# Patient Record
Sex: Female | Born: 1985
Health system: Southern US, Community
[De-identification: ages and names within clinical notes are randomized; demographics above are authoritative.]

## PROBLEM LIST (undated history)

## (undated) ENCOUNTER — Inpatient Hospital Stay (HOSPITAL_COMMUNITY): Payer: Self-pay

## (undated) DIAGNOSIS — O24419 Gestational diabetes mellitus in pregnancy, unspecified control: Secondary | ICD-10-CM

## (undated) DIAGNOSIS — Z789 Other specified health status: Secondary | ICD-10-CM

## (undated) HISTORY — PX: NECK SURGERY: SHX720

---

## 2016-10-15 ENCOUNTER — Encounter: Payer: Self-pay | Admitting: Family Medicine

## 2016-10-15 ENCOUNTER — Inpatient Hospital Stay (HOSPITAL_COMMUNITY): Payer: Medicaid Other

## 2016-10-15 ENCOUNTER — Encounter (HOSPITAL_COMMUNITY): Payer: Self-pay | Admitting: Obstetrics and Gynecology

## 2016-10-15 ENCOUNTER — Ambulatory Visit (INDEPENDENT_AMBULATORY_CARE_PROVIDER_SITE_OTHER): Payer: Self-pay | Admitting: Family Medicine

## 2016-10-15 ENCOUNTER — Inpatient Hospital Stay (HOSPITAL_COMMUNITY)
Admission: AD | Admit: 2016-10-15 | Discharge: 2016-10-15 | Disposition: A | Discharge status: Home / Self Care | Payer: Medicaid Other | Source: Ambulatory Visit | Attending: Obstetrics & Gynecology | Admitting: Obstetrics & Gynecology

## 2016-10-15 VITALS — BP 114/78 | HR 82 | Temp 98.8°F | Resp 16 | Ht 65.0 in | Wt 202.5 lb

## 2016-10-15 DIAGNOSIS — B373 Candidiasis of vulva and vagina: Secondary | ICD-10-CM

## 2016-10-15 DIAGNOSIS — Z3A01 Less than 8 weeks gestation of pregnancy: Secondary | ICD-10-CM | POA: Insufficient documentation

## 2016-10-15 DIAGNOSIS — O209 Hemorrhage in early pregnancy, unspecified: Secondary | ICD-10-CM | POA: Diagnosis not present

## 2016-10-15 DIAGNOSIS — N912 Amenorrhea, unspecified: Secondary | ICD-10-CM

## 2016-10-15 DIAGNOSIS — R102 Pelvic and perineal pain: Secondary | ICD-10-CM

## 2016-10-15 DIAGNOSIS — Z79899 Other long term (current) drug therapy: Secondary | ICD-10-CM | POA: Diagnosis not present

## 2016-10-15 DIAGNOSIS — R109 Unspecified abdominal pain: Secondary | ICD-10-CM | POA: Diagnosis not present

## 2016-10-15 DIAGNOSIS — O3680X0 Pregnancy with inconclusive fetal viability, not applicable or unspecified: Secondary | ICD-10-CM

## 2016-10-15 DIAGNOSIS — O26891 Other specified pregnancy related conditions, first trimester: Secondary | ICD-10-CM | POA: Insufficient documentation

## 2016-10-15 DIAGNOSIS — O26899 Other specified pregnancy related conditions, unspecified trimester: Secondary | ICD-10-CM

## 2016-10-15 DIAGNOSIS — O4690 Antepartum hemorrhage, unspecified, unspecified trimester: Secondary | ICD-10-CM

## 2016-10-15 DIAGNOSIS — B3731 Acute candidiasis of vulva and vagina: Secondary | ICD-10-CM

## 2016-10-15 DIAGNOSIS — O208 Other hemorrhage in early pregnancy: Secondary | ICD-10-CM

## 2016-10-15 DIAGNOSIS — N949 Unspecified condition associated with female genital organs and menstrual cycle: Secondary | ICD-10-CM

## 2016-10-15 HISTORY — DX: Other specified health status: Z78.9

## 2016-10-15 LAB — POC MICROSCOPIC URINALYSIS (UMFC): Mucus: ABSENT

## 2016-10-15 LAB — POCT WET + KOH PREP: Trich by wet prep: ABSENT

## 2016-10-15 LAB — POCT CBC
GRANULOCYTE PERCENT: 58.4 % (ref 37–80)
HEMATOCRIT: 38.9 % (ref 37.7–47.9)
Hemoglobin: 13.2 g/dL (ref 12.2–16.2)
LYMPH, POC: 1.9 (ref 0.6–3.4)
MCH, POC: 28.9 pg (ref 27–31.2)
MCHC: 34 g/dL (ref 31.8–35.4)
MCV: 85 fL (ref 80–97)
MID (CBC): 1 — AB (ref 0–0.9)
MPV: 7.9 fL (ref 0–99.8)
POC GRANULOCYTE: 4.1 (ref 2–6.9)
POC LYMPH %: 27.4 % (ref 10–50)
POC MID %: 14.2 %M — AB (ref 0–12)
Platelet Count, POC: 277 10*3/uL (ref 142–424)
RBC: 4.58 M/uL (ref 4.04–5.48)
RDW, POC: 12.9 %
WBC: 7 10*3/uL (ref 4.6–10.2)

## 2016-10-15 LAB — POCT URINALYSIS DIP (MANUAL ENTRY)
Bilirubin, UA: NEGATIVE
GLUCOSE UA: NEGATIVE mg/dL
Ketones, POC UA: NEGATIVE mg/dL
Nitrite, UA: NEGATIVE
Spec Grav, UA: 1.02 (ref 1.010–1.025)
UROBILINOGEN UA: 0.2 U/dL
pH, UA: 6.5 (ref 5.0–8.0)

## 2016-10-15 LAB — ABO/RH: ABO/RH(D): O POS

## 2016-10-15 LAB — BETA HCG QUANT (REF LAB): hCG Quant: 31 m[IU]/mL

## 2016-10-15 LAB — HCG, QUANTITATIVE, PREGNANCY: HCG, BETA CHAIN, QUANT, S: 31 m[IU]/mL — AB (ref <5)

## 2016-10-15 LAB — POCT URINE PREGNANCY: PREG TEST UR: POSITIVE — AB

## 2016-10-15 NOTE — Patient Instructions (Addendum)
Estimated due date: 06/16/2017 so you are [redacted] weeks pregnant.  I would like you to go to the Maternity Admissions Unit at [facility] - they are expecting you for a pelvic ultrasound to ensure that you do not have an ectopic (tubal) pregnancy.   IF you received an x-ray today, you will receive an invoice from [facility]. Please contact [facility] at [phone] with questions or concerns regarding your invoice.   IF you received labwork today, you will receive an invoice from [facility]. Please contact [facility] at [phone] with questions or concerns regarding your invoice.   Our billing staff will not be able to assist you with questions regarding bills from these companies.  You will be contacted with the lab results as soon as they are available. The fastest way to get your results is to activate your My Chart account. Instructions are located on the last page of this paperwork. If you have not heard from [place] regarding the results in 2 weeks, please contact this office.     Vaginal Bleeding During Pregnancy, First Trimester A small amount of bleeding (spotting) from the vagina is relatively common in early pregnancy. It usually stops on its own. Various things may cause bleeding or spotting in early pregnancy. Some bleeding may be related to the pregnancy, and some may not. In most cases, the bleeding is normal and is not a problem. However, bleeding can also be a sign of something serious. Be sure to tell your health care provider about any vaginal bleeding right away. Some possible causes of vaginal bleeding during the first trimester include:  Infection or inflammation of the cervix.  Growths (polyps) on the cervix.  Miscarriage or threatened miscarriage.  Pregnancy tissue has developed outside of the uterus and in a fallopian tube (tubal pregnancy).  Tiny cysts have developed in the uterus instead of pregnancy tissue (molar pregnancy).  Follow these  instructions at home: Watch your condition for any changes. The following actions may help to lessen any discomfort you are feeling:  Follow your health care provider's instructions for limiting your activity. If your health care provider orders bed rest, you may need to stay in bed and only get up to use the bathroom. However, your health care provider may allow you to continue light activity.  If needed, make plans for someone to help with your regular activities and responsibilities while you are on bed rest.  Keep track of the number of pads you use each day, how often you change pads, and how soaked (saturated) they are. Write this down.  Do not use tampons. Do not douche.  Do not have sexual intercourse or orgasms until approved by your health care provider.  If you pass any tissue from your vagina, save the tissue so you can show it to your health care provider.  Only take over-the-counter or prescription medicines as directed by your health care provider.  Do not take aspirin because it can make you bleed.  Keep all follow-up appointments as directed by your health care provider.  Contact a health care provider if:  You have any vaginal bleeding during any part of your pregnancy.  You have cramps or labor pains.  You have a fever, not controlled by medicine. Get help right away if:  You have severe cramps in your back or belly (abdomen).  You pass large clots or tissue from your vagina.  Your bleeding increases.  You feel light-headed or weak, or you have fainting episodes.  You have chills.  You are leaking fluid or have a gush of fluid from your vagina.  You pass out while having a bowel movement. This information is not intended to replace advice given to you by your health care provider. Make sure you discuss any questions you have with your health care provider. Document Released: 11/10/2004 Document Revised: 07/09/2015 Document Reviewed: 10/08/2012 Elsevier  Interactive Patient Education  2018 [employer].    Ectopic Pregnancy An ectopic pregnancy is when the fertilized egg attaches (implants) outside the uterus. Most ectopic pregnancies occur in one of the tubes where eggs travel from the ovary to the uterus (fallopian tubes), but the implanting can occur in other locations. In rare cases, ectopic pregnancies occur on the ovary, intestine, pelvis, abdomen, or cervix. In an ectopic pregnancy, the fertilized egg does not have the ability to develop into a normal, healthy baby. A ruptured ectopic pregnancy is one in which tearing or bursting of a fallopian tube causes internal bleeding. Often, there is intense lower abdominal pain, and vaginal bleeding sometimes occurs. Having an ectopic pregnancy can be life-threatening. If this dangerous condition is not treated, it can lead to blood loss, shock, or even death. What are the causes? The most common cause of this condition is damage to one of the fallopian tubes. A fallopian tube may be narrowed or blocked, and that keeps the fertilized egg from reaching the uterus. What increases the risk? This condition is more likely to develop in women of childbearing age who have different levels of risk. The levels of risk can be divided into three categories. High risk  You have gone through infertility treatment.  You have had an ectopic pregnancy before.  You have had surgery on the fallopian tubes, or another surgical procedure, such as an abortion.  You have had surgery to have the fallopian tubes tied (tubal ligation).  You have problems or diseases of the fallopian tubes.  You have been exposed to diethylstilbestrol (DES). This medicine was used until 1971, and it had effects on babies whose mothers took the medicine.  You become pregnant while using an IUD (intrauterine device) for birth control. Moderate risk  You have a history of infertility.  You have had an STI (sexually transmitted  infection).  You have a history of pelvic inflammatory disease (PID).  You have scarring from endometriosis.  You have multiple sexual partners.  You smoke. Low risk  You have had pelvic surgery.  You use vaginal douches.  You became sexually active before age 56. What are the signs or symptoms? Common symptoms of this condition include normal pregnancy symptoms, such as missing a period, nausea, tiredness, abdominal pain, breast tenderness, and bleeding. However, ectopic pregnancy will have additional symptoms, such as:  Pain with intercourse.  Irregular vaginal bleeding or spotting.  Cramping or pain on one side or in the lower abdomen.  Fast heartbeat, low blood pressure, and sweating.  Passing out while having a bowel movement.  Symptoms of a ruptured ectopic pregnancy and internal bleeding may include:  Sudden, severe pain in the abdomen and pelvis.  Dizziness, weakness, light-headedness, or fainting.  Pain in the shoulder or neck area.  How is this diagnosed? This condition is diagnosed by:  A pelvic exam to locate pain or a mass in the abdomen.  A pregnancy test. This blood test checks for the presence as well as the specific level of pregnancy hormone in the bloodstream.  Ultrasound. This is performed if a pregnancy test  is positive. In this test, a probe is inserted into the vagina. The probe will detect a fetus, possibly in a location other than the uterus.  Taking a sample of uterus tissue (dilation and curettage, or D&C).  Surgery to perform a visual exam of the inside of the abdomen using a thin, lighted tube that has a tiny camera on the end (laparoscope).  Culdocentesis. This procedure involves inserting a needle at the top of the vagina, behind the uterus. If blood is present in this area, it may indicate that a fallopian tube is torn.  How is this treated? This condition is treated with medicine or surgery. Medicine  An injection of a medicine  (methotrexate) may be given to cause the pregnancy tissue to be absorbed. This medicine may save your fallopian tube. It may be given if: ? The diagnosis is made early, with no signs of active bleeding. ? The fallopian tube has not ruptured. ? You are considered to be a good candidate for the medicine. Usually, pregnancy hormone blood levels are checked after methotrexate treatment. This is to be sure that the medicine is effective. It may take 4-6 weeks for the pregnancy to be absorbed. Most pregnancies will be absorbed by 3 weeks. Surgery  A laparoscope may be used to remove the pregnancy tissue.  If severe internal bleeding occurs, a larger cut (incision) may be made in the lower abdomen (laparotomy) to remove the fetus and placenta. This is done to stop the bleeding.  Part or all of the fallopian tube may be removed (salpingectomy) along with the fetus and placenta. The fallopian tube may also be repaired during the surgery.  In very rare circumstances, removal of the uterus (hysterectomy) may be required.  After surgery, pregnancy hormone testing may be done to be sure that there is no pregnancy tissue left. Whether your treatment is medicine or surgery, you may receive a Rho (D) immune globulin shot to prevent problems with any future pregnancy. This shot may be given if:  You are Rh-negative and the baby's father is Rh-positive.  You are Rh-negative and you do not know the Rh type of the baby's father.  Follow these instructions at home:  Rest and limit your activity after the procedure for as long as told by your health care provider.  Until your health care provider says that it is safe: ? Do not lift anything that is heavier than 10 lb (4.5 kg), or the limit that your health care provider tells you. ? Avoid physical exercise and any movement that requires effort (is strenuous).  To help prevent constipation: ? Eat a healthy diet that includes fruits, vegetables, and whole  grains. ? Drink 6-8 glasses of water per day. Get help right away if:  You develop worsening pain that is not relieved by medicine.  You have: ? A fever or chills. ? Vaginal bleeding. ? Redness and swelling at the incision site. ? Nausea and vomiting.  You feel dizzy or weak.  You feel light-headed or you faint. This information is not intended to replace advice given to you by your health care provider. Make sure you discuss any questions you have with your health care provider. Document Released: 03/10/2004 Document Revised: 09/30/2015 Document Reviewed: 09/02/2015 Elsevier Interactive Patient Education  2018 [employer].  First Trimester of Pregnancy The first trimester of pregnancy is from week 1 until the end of week 13 (months 1 through 3). A week after a sperm fertilizes an egg, the egg  will implant on the wall of the uterus. This embryo will begin to develop into a baby. Genes from you and your partner will form the baby. The female genes will determine whether the baby will be a boy or a girl. At 6-8 weeks, the eyes and face will be formed, and the heartbeat can be seen on ultrasound. At the end of 12 weeks, all the baby's organs will be formed. Now that you are pregnant, you will want to do everything you can to have a healthy baby. Two of the most important things are to get good prenatal care and to follow your health care provider's instructions. Prenatal care is all the medical care you receive before the baby's birth. This care will help prevent, find, and treat any problems during the pregnancy and childbirth. Body changes during your first trimester Your body goes through many changes during pregnancy. The changes vary from woman to woman.  You may gain or lose a couple of pounds at first.  You may feel sick to your stomach (nauseous) and you may throw up (vomit). If the vomiting is uncontrollable, call your health care provider.  You may tire easily.  You may develop  headaches that can be relieved by medicines. All medicines should be approved by your health care provider.  You may urinate more often. Painful urination may mean you have a bladder infection.  You may develop heartburn as a result of your pregnancy.  You may develop constipation because certain hormones are causing the muscles that push stool through your intestines to slow down.  You may develop hemorrhoids or swollen veins (varicose veins).  Your breasts may begin to grow larger and become tender. Your nipples may stick out more, and the tissue that surrounds them (areola) may become darker.  Your gums may bleed and may be sensitive to brushing and flossing.  Dark spots or blotches (chloasma, mask of pregnancy) may develop on your face. This will likely fade after the baby is born.  Your menstrual periods will stop.  You may have a loss of appetite.  You may develop cravings for certain kinds of food.  You may have changes in your emotions from day to day, such as being excited to be pregnant or being concerned that something may go wrong with the pregnancy and baby.  You may have more vivid and strange dreams.  You may have changes in your hair. These can include thickening of your hair, rapid growth, and changes in texture. Some women also have hair loss during or after pregnancy, or hair that feels dry or thin. Your hair will most likely return to normal after your baby is born.  What to expect at prenatal visits During a routine prenatal visit:  You will be weighed to make sure you and the baby are growing normally.  Your blood pressure will be taken.  Your abdomen will be measured to track your baby's growth.  The fetal heartbeat will be listened to between weeks 10 and 14 of your pregnancy.  Test results from any previous visits will be discussed.  Your health care provider may ask you:  How you are feeling.  If you are feeling the baby move.  If you have had  any abnormal symptoms, such as leaking fluid, bleeding, severe headaches, or abdominal cramping.  If you are using any tobacco products, including cigarettes, chewing tobacco, and electronic cigarettes.  If you have any questions.  Other tests that may be performed during  your first trimester include:  Blood tests to find your blood type and to check for the presence of any previous infections. The tests will also be used to check for low iron levels (anemia) and protein on red blood cells (Rh antibodies). Depending on your risk factors, or if you previously had diabetes during pregnancy, you may have tests to check for high blood sugar that affects pregnant women (gestational diabetes).  Urine tests to check for infections, diabetes, or protein in the urine.  An ultrasound to confirm the proper growth and development of the baby.  Fetal screens for spinal cord problems (spina bifida) and Down syndrome.  HIV (human immunodeficiency virus) testing. Routine prenatal testing includes screening for HIV, unless you choose not to have this test.  You may need other tests to make sure you and the baby are doing well.  Follow these instructions at home: Medicines  Follow your health care provider's instructions regarding medicine use. Specific medicines may be either safe or unsafe to take during pregnancy.  Take a prenatal vitamin that contains at least 600 micrograms (mcg) of folic acid.  If you develop constipation, try taking a stool softener if your health care provider approves. Eating and drinking  Eat a balanced diet that includes fresh fruits and vegetables, whole grains, good sources of protein such as meat, eggs, or tofu, and low-fat dairy. Your health care provider will help you determine the amount of weight gain that is right for you.  Avoid raw meat and uncooked cheese. These carry germs that can cause birth defects in the baby.  Eating four or five small meals rather than  three large meals a day may help relieve nausea and vomiting. If you start to feel nauseous, eating a few soda crackers can be helpful. Drinking liquids between meals, instead of during meals, also seems to help ease nausea and vomiting.  Limit foods that are high in fat and processed sugars, such as fried and sweet foods.  To prevent constipation: ? Eat foods that are high in fiber, such as fresh fruits and vegetables, whole grains, and beans. ? Drink enough fluid to keep your urine clear or pale yellow. Activity  Exercise only as directed by your health care provider. Most women can continue their usual exercise routine during pregnancy. Try to exercise for 30 minutes at least 5 days a week. Exercising will help you: ? Control your weight. ? Stay in shape. ? Be prepared for labor and delivery.  Experiencing pain or cramping in the lower abdomen or lower back is a good sign that you should stop exercising. Check with your health care provider before continuing with normal exercises.  Try to avoid standing for long periods of time. Move your legs often if you must stand in one place for a long time.  Avoid heavy lifting.  Wear low-heeled shoes and practice good posture.  You may continue to have sex unless your health care provider tells you not to. Relieving pain and discomfort  Wear a good support bra to relieve breast tenderness.  Take warm sitz baths to soothe any pain or discomfort caused by hemorrhoids. Use hemorrhoid cream if your health care provider approves.  Rest with your legs elevated if you have leg cramps or low back pain.  If you develop varicose veins in your legs, wear support hose. Elevate your feet for 15 minutes, 3-4 times a day. Limit salt in your diet. Prenatal care  Schedule your prenatal visits by the twelfth  week of pregnancy. They are usually scheduled monthly at first, then more often in the last 2 months before delivery.  Write down your questions.  Take them to your prenatal visits.  Keep all your prenatal visits as told by your health care provider. This is important. Safety  Wear your seat belt at all times when driving.  Make a list of emergency phone numbers, including numbers for family, friends, the hospital, and police and fire departments. General instructions  Ask your health care provider for a referral to a local prenatal education class. Begin classes no later than the beginning of month 6 of your pregnancy.  Ask for help if you have counseling or nutritional needs during pregnancy. Your health care provider can offer advice or refer you to specialists for help with various needs.  Do not use hot tubs, steam rooms, or saunas.  Do not douche or use tampons or scented sanitary pads.  Do not cross your legs for long periods of time.  Avoid cat litter boxes and soil used by cats. These carry germs that can cause birth defects in the baby and possibly loss of the fetus by miscarriage or stillbirth.  Avoid all smoking, herbs, alcohol, and medicines not prescribed by your health care provider. Chemicals in these products affect the formation and growth of the baby.  Do not use any products that contain nicotine or tobacco, such as cigarettes and e-cigarettes. If you need help quitting, ask your health care provider. You may receive counseling support and other resources to help you quit.  Schedule a dentist appointment. At home, brush your teeth with a soft toothbrush and be gentle when you floss. Contact a health care provider if:  You have dizziness.  You have mild pelvic cramps, pelvic pressure, or nagging pain in the abdominal area.  You have persistent nausea, vomiting, or diarrhea.  You have a bad smelling vaginal discharge.  You have pain when you urinate.  You notice increased swelling in your face, hands, legs, or ankles.  You are exposed to fifth disease or chickenpox.  You are exposed to [place]  (rubella) and have never had it. Get help right away if:  You have a fever.  You are leaking fluid from your vagina.  You have spotting or bleeding from your vagina.  You have severe abdominal cramping or pain.  You have rapid weight gain or loss.  You vomit blood or material that looks like coffee grounds.  You develop a severe headache.  You have shortness of breath.  You have any kind of trauma, such as from a fall or a car accident. Summary  The first trimester of pregnancy is from week 1 until the end of week 13 (months 1 through 3).  Your body goes through many changes during pregnancy. The changes vary from woman to woman.  You will have routine prenatal visits. During those visits, your health care provider will examine you, discuss any test results you may have, and talk with you about how you are feeling. This information is not intended to replace advice given to you by your health care provider. Make sure you discuss any questions you have with your health care provider. Document Released: 01/25/2001 Document Revised: 01/13/2016 Document Reviewed: 01/13/2016 Elsevier Interactive Patient Education  2017 [employer]

## 2016-10-15 NOTE — MAU Provider Note (Signed)
History     CSN: [id number]  Arrival date and time: 10/15/16 1531   First Provider Initiated Contact with Patient 10/15/16 1547      Chief Complaint  Patient presents with  . vaginal bleeding in pregnancy  . Abdominal Pain   Christy Jackson is a 31 y.o. G1P0 at [redacted]w[redacted]d sent here from [facility] where she presented with 5 day history of light vaginal bleeding that became redder today. She has not needed to wear a peri-pad and has not had bleeding as heavy as a period. The bleeding has been associated with intermittent mild suprapubic cramping. Bleeding was not post-coital. Pregnancy is desired.  Labs results from [place]: Pos UPT, , UA with large blood, normalCBC, WP significant for yeast. Pending GC/CT, urine culture       Past Medical History:  Diagnosis Date  . Medical history non-contributory     Past Surgical History:  Procedure Laterality Date  . NO PAST SURGERIES      History reviewed. No pertinent family history.  Social History  Substance Use Topics  . Smoking status: Never Smoker  . Smokeless tobacco: Never Used  . Alcohol use No    Allergies: No Known Allergies  Prescriptions Prior to Admission  Medication Sig Dispense Refill Last Dose  . folic acid (FOLVITE) 400 MCG tablet Take 400 mcg by mouth daily.   10/14/2016 at Unknown time    Review of Systems  Constitutional: Negative for fatigue.  Gastrointestinal: Positive for abdominal pain.  Genitourinary: Positive for vaginal bleeding. Negative for dysuria, frequency, urgency, vaginal discharge and vaginal pain.  Neurological: Negative for light-headedness and headaches.   Physical Exam   Blood pressure 116/77, pulse 77, temperature 98.2 F (36.8 C), temperature source Oral, resp. rate 18, last menstrual period 09/09/2016.  Physical Exam  Nursing note and vitals reviewed. Constitutional: She appears well-developed and well-nourished. No distress.  Eyes: Pupils are equal, round, and reactive to  light.  Neck: Neck supple.  Cardiovascular: Normal rate.   Respiratory: Effort normal.  GI: There is no tenderness. There is no guarding.  Genitourinary:  Genitourinary Comments: VE: small amount dark red blood, cx closed, uterus not appreciably enlarged  Musculoskeletal: Normal range of motion.  Neurological: She is alert.  Skin: Skin is warm and dry.  Psychiatric: She has a normal mood and affect. Her behavior is normal.    MAU Course  Procedures Results for orders placed or performed during the hospital encounter of 10/15/16 (from the past 24 hour(s))  hCG, quantitative, pregnancy     Status: Abnormal   Collection Time: 10/15/16  4:15 PM  Result Value Ref Range   hCG, Beta Chain, Quant, S 31 (H) <5 mIU/mL  ABO/Rh     Status: None (Preliminary result)   Collection Time: 10/15/16  4:58 PM  Result Value Ref Range   ABO/RH(D) O POS    US Ob Comp Less 14 Wks  Result Date: 10/15/2016 CLINICAL DATA:  Bleeding for 5 days. EXAM: OBSTETRIC <14 WK [place] AND TRANSVAGINAL OB US TECHNIQUE: Both transabdominal and transvaginal ultrasound examinations were performed for complete evaluation of the gestation as well as the maternal uterus, adnexal regions, and pelvic cul-de-sac. Transvaginal technique was performed to assess early pregnancy. COMPARISON:  None. FINDINGS: Intrauterine gestational sac: None Yolk sac:  Absent Embryo:  Absent Cardiac Activity: Absent Heart Rate: Absent  bpm MSD:   mm    w     d CRL:    mm    w  d                  US EDC: Subchorionic hemorrhage:  Not applicable Maternal uterus/adnexae:  Normal IMPRESSION: No intrauterine gestation identified. The uterus and adnexa are otherwise normal. Electronically Signed   By: Sherian ReinWei-Chen  Lin M.D.   On: 10/15/2016 17:52   [place] Ob Transvaginal  Result Date: 10/15/2016 CLINICAL DATA:  Bleeding for 5 days. EXAM: OBSTETRIC <14 WK US AND TRANSVAGINAL OB US TECHNIQUE: Both transabdominal and transvaginal ultrasound examinations were performed for  complete evaluation of the gestation as well as the maternal uterus, adnexal regions, and pelvic cul-de-sac. Transvaginal technique was performed to assess early pregnancy. COMPARISON:  None. FINDINGS: Intrauterine gestational sac: None Yolk sac:  Absent Embryo:  Absent Cardiac Activity: Absent Heart Rate: Absent  bpm MSD:   mm    w     d CRL:    mm    w    d                  US EDC: Subchorionic hemorrhage:  Not applicable Maternal uterus/adnexae:  Normal IMPRESSION: No intrauterine gestation identified. The uterus and adnexa are otherwise normal. Electronically Signed   By: Sherian ReinWei-Chen  Lin M.D.   On: 10/15/2016 17:52     Assessment and Plan  G1 at 2767w1d 1. Pregnancy of unknown anatomic location   2. Bleeding in early pregnancy   3. Vaginal bleeding in pregnancy   4. Abdominal pain affecting pregnancy    Allergies as of 10/15/2016   No Known Allergies     Medication List    TAKE these medications   folic acid 400 MCG tablet Commonly known as:  FOLVITE Take 400 mcg by mouth daily.      Follow-up Information    [place], Nursepractioner, NP Follow up in 2 day(s).           Shakema Surita CNM 10/15/2016, 5:32 PM

## 2016-10-15 NOTE — MAU Note (Signed)
Pt sent to MAU from [facility], has been bleeding for the last 5 days, was spotting, is heavier today.  Has occasional abd & back pain.  Pos UPT @ UC today.

## 2016-10-15 NOTE — Progress Notes (Signed)
Subjective:    Patient ID: Christy Jackson, female    DOB: 05/01/1987, 31 y.o.   MRN: [id number] Chief Complaint  Patient presents with  . Amenorrhea    HPI This is her first pregnancy. LMP 09/09/16. She is excited about the pregnancy. However, for the past 5d she has had vaginal bleeding/spotting with suprapubic abd pain that radiates to the right lower quadrant and the right flank.  She has had sweats but no f/c and has been nauseated and constipated. No vomiting. Has not taken anything or tried anything. Her menses are usually every 21d so this does NOT correlate with her first expected cycle since conception. Cycles usually last 4-5 d with good flow and no spotting between. No ob-gyn yet.  Past Medical History:  Diagnosis Date  . Medical history non-contributory    Past Surgical History:  Procedure Laterality Date  . NO PAST SURGERIES     No current outpatient prescriptions on file prior to visit.   No current facility-administered medications on file prior to visit.    No Known Allergies No family history on file. Social History   Social History  . Marital status: Married    Spouse name: N/A  . Number of children: N/A  . Years of education: N/A   Social History Main Topics  . Smoking status: Never Smoker  . Smokeless tobacco: Never Used  . Alcohol use No  . Drug use: No  . Sexual activity: Yes   Other Topics Concern  . None   Social History Narrative  . None   Depression screen [facility] 2/9 10/15/2016  Decreased Interest 0  Down, Depressed, Hopeless 0  PHQ - 2 Score 0    Review of Systems  Constitutional: Positive for diaphoresis and fatigue. Negative for activity change, appetite change, chills, fever and unexpected weight change.  Gastrointestinal: Positive for abdominal pain, constipation and nausea. Negative for abdominal distention, diarrhea and vomiting.  Genitourinary: Positive for decreased urine volume, flank pain, menstrual problem, pelvic pain, vaginal  bleeding and vaginal discharge. Negative for dysuria, enuresis, genital sores, hematuria, urgency and vaginal pain.       Objective:   Physical Exam  Constitutional: She is oriented to person, place, and time. She appears well-developed and well-nourished. No distress.  HENT:  Head: Normocephalic and atraumatic.  Cardiovascular: Normal rate, regular rhythm, normal heart sounds and intact distal pulses.   Pulmonary/Chest: Effort normal and breath sounds normal.  Abdominal: Soft. Normal appearance and bowel sounds are normal. She exhibits no distension and no mass. There is no hepatosplenomegaly. There is tenderness in the suprapubic area. There is no rigidity, no rebound, no guarding and no CVA tenderness.  Genitourinary: Pelvic exam was performed with patient supine. There is no rash, tenderness or lesion on the right labia. There is no rash, tenderness or lesion on the left labia. Uterus is tender. Cervix exhibits motion tenderness. Cervix exhibits no friability. Right adnexum displays tenderness. Right adnexum displays no mass and no fullness. Left adnexum displays no mass, no tenderness and no fullness. There is bleeding in the vagina. No erythema or tenderness in the vagina. Vaginal discharge found.  Genitourinary Comments: Small amount of thin maroon bloody discharge in vaginal vault, cervix appears nml and closed but + CMT with sig ttp of uterus and mod ttp of Rt adnexa  Lymphadenopathy:       Right: No inguinal adenopathy present.       Left: No inguinal adenopathy present.  Neurological: She is alert and  oriented to person, place, and time.  Skin: Skin is warm and dry. She is not diaphoretic.  Psychiatric: She has a normal mood and affect. Her behavior is normal.      BP 114/78   Pulse 82   Temp 98.8 F (37.1 C) (Oral)   Resp 16   Ht 5\' 5"  (1.651 m)   Wt 202 lb 8 oz (91.9 kg)   LMP 09/09/2016   SpO2 95%   BMI 33.70 kg/m   Results for orders placed or performed in visit on  10/15/16  POCT urine pregnancy  Result Value Ref Range   Preg Test, Ur Positive (A) Negative      Assessment & Plan:   1. Amenorrhea - reassured pt that there is a possibility that this is nml spotting early in preg and reassuring that cervix is closed but there is also a poss that she could be miscarrying so will check quant hct in case needs to be followed serially. However, her right adnexal and flank pain are concerning for the poss of an ectopic preg so need stat pelvic/TV US today.  It is a Sat so will send her to [facility] ER - discussed with CNM on call who is expecting pt and pt understands to go there immed.  2. Vaginal bleeding affecting early pregnancy   3. Pelvic pain affecting pregnancy in first trimester, antepartum   4. Cervical motion tenderness   5. Vaginal candidiasis     Orders Placed This Encounter  Procedures  . US Pelvis Complete    Standing Status:   Future    Standing Expiration Date:   12/15/2017    Order Specific Question:   Reason for Exam (SYMPTOM  OR DIAGNOSIS REQUIRED)    Answer:   +UPT, [redacted] wks GA by LMP, 5d of increasing Rt adnexal/flank pain and vaginal bleeding    Order Specific Question:   Preferred imaging location?    Answer:   [facility]  . US OB Transvaginal    Standing Status:   Future    Standing Expiration Date:   12/15/2017    Order Specific Question:   Reason for Exam (SYMPTOM  OR DIAGNOSIS REQUIRED)    Answer:   +UPT, [redacted] wks GA by LMP, 5d of increasing Rt adnexal/flank pain and vaginal bleeding    Order Specific Question:   Preferred imaging location?    Answer:   [facility]  . POCT urine pregnancy  . POCT Wet + KOH Prep  . POCT CBC  . POCT urinalysis dipstick  . POCT Microscopic Urinalysis ([facility])     Norberto Sorenson, M.D.  Primary Care at [facility] [address] [place], [place] [address] [phone] phone [phone] fax  10/16/16 1:36 PM   Results for orders placed or performed in visit on 10/15/16    POCT urine pregnancy  Result Value Ref Range   Preg Test, Ur Positive (A) Negative  POCT Wet + KOH Prep  Result Value Ref Range   Yeast by KOH Present (A) Absent   Yeast by wet prep Present (A) Absent   WBC by wet prep None (A) Few   Clue Cells Wet Prep HPF POC None None   Trich by wet prep Absent Absent   Bacteria Wet Prep HPF POC Many (A) Few   Epithelial Cells By [employer] Pref ([facility]) Moderate (A) None, Few, Too numerous to count   RBC,UR,HPF,POC Too numerous to count  (A) None RBC/hpf  POCT CBC  Result Value Ref Range   WBC 7.0 4.6 - 10.2 K/uL   Lymph, poc 1.9 0.6 - 3.4   POC LYMPH PERCENT 27.4 10 - 50 %L   MID (cbc) 1.0 (A) 0 - 0.9   POC MID % 14.2 (A) 0 - 12 %M   POC Granulocyte 4.1 2 - 6.9   Granulocyte percent 58.4 37 - 80 %G   RBC 4.58 4.04 - 5.48 M/uL   Hemoglobin 13.2 12.2 - 16.2 g/dL   HCT, POC 16.138.9 09.637.7 - 47.9 %   MCV 85.0 80 - 97 fL   MCH, POC 28.9 27 - 31.2 pg   MCHC 34.0 31.8 - 35.4 g/dL   RDW, POC 04.512.9 %   Platelet Count, POC 277 142 - 424 K/uL   MPV 7.9 0 - 99.8 fL  POCT urinalysis dipstick  Result Value Ref Range   Color, UA yellow yellow   Clarity, UA cloudy (A) clear   Glucose, UA negative negative mg/dL   Bilirubin, UA negative negative   Ketones, POC UA negative negative mg/dL   Spec Grav, UA 4.0981.020 1.1911.010 - 1.025   Blood, UA large (A) negative   pH, UA 6.5 5.0 - 8.0   Protein Ur, POC trace (A) negative mg/dL   Urobilinogen, UA 0.2 0.2 or 1.0 E.U./dL   Nitrite, UA Negative Negative   Leukocytes, UA Trace (A) Negative  POCT Microscopic Urinalysis ([facility])  Result Value Ref Range   WBC,UR,HPF,POC None None WBC/hpf   RBC,UR,HPF,POC Too numerous to count  (A) None RBC/hpf   Bacteria Few (A) None, Too numerous to count   Mucus Absent Absent   Epithelial Cells, UR Per Microscopy Few (A) None, Too numerous to count cells/hpf

## 2016-10-15 NOTE — Discharge Instructions (Signed)
Vaginal Bleeding During Pregnancy, First Trimester A small amount of bleeding (spotting) from the vagina is relatively common in early pregnancy. It usually stops on its own. Various things may cause bleeding or spotting in early pregnancy. Some bleeding may be related to the pregnancy, and some may not. In most cases, the bleeding is normal and is not a problem. However, bleeding can also be a sign of something serious. Be sure to tell your health care provider about any vaginal bleeding right away. Some possible causes of vaginal bleeding during the first trimester include:  Infection or inflammation of the cervix.  Growths (polyps) on the cervix.  Miscarriage or threatened miscarriage.  Pregnancy tissue has developed outside of the uterus and in a fallopian tube (tubal pregnancy).  Tiny cysts have developed in the uterus instead of pregnancy tissue (molar pregnancy).  Follow these instructions at home: Watch your condition for any changes. The following actions may help to lessen any discomfort you are feeling:  Follow your health care provider's instructions for limiting your activity. If your health care provider orders bed rest, you may need to stay in bed and only get up to use the bathroom. However, your health care provider may allow you to continue light activity.  If needed, make plans for someone to help with your regular activities and responsibilities while you are on bed rest.  Keep track of the number of pads you use each day, how often you change pads, and how soaked (saturated) they are. Write this down.  Do not use tampons. Do not douche.  Do not have sexual intercourse or orgasms until approved by your health care provider.  If you pass any tissue from your vagina, save the tissue so you can show it to your health care provider.  Only take over-the-counter or prescription medicines as directed by your health care provider.  Do not take aspirin because it can make  you bleed.  Keep all follow-up appointments as directed by your health care provider.  Contact a health care provider if:  You have any vaginal bleeding during any part of your pregnancy.  You have cramps or labor pains.  You have a fever, not controlled by medicine. Get help right away if:  You have severe cramps in your back or belly (abdomen).  You pass large clots or tissue from your vagina.  Your bleeding increases.  You feel light-headed or weak, or you have fainting episodes.  You have chills.  You are leaking fluid or have a gush of fluid from your vagina.  You pass out while having a bowel movement. This information is not intended to replace advice given to you by your health care provider. Make sure you discuss any questions you have with your health care provider. Document Released: 11/10/2004 Document Revised: 07/09/2015 Document Reviewed: 10/08/2012 Elsevier Interactive Patient Education  2018 [employer] Ectopic Pregnancy An ectopic pregnancy is when the fertilized egg attaches (implants) outside the uterus. Most ectopic pregnancies occur in one of the tubes where eggs travel from the ovary to the uterus (fallopian tubes), but the implanting can occur in other locations. In rare cases, ectopic pregnancies occur on the ovary, intestine, pelvis, abdomen, or cervix. In an ectopic pregnancy, the fertilized egg does not have the ability to develop into a normal, healthy baby. A ruptured ectopic pregnancy is one in which tearing or bursting of a fallopian tube causes internal bleeding. Often, there is intense lower abdominal pain, and vaginal bleeding sometimes occurs. Having  an ectopic pregnancy can be life-threatening. If this dangerous condition is not treated, it can lead to blood loss, shock, or even death. What are the causes? The most common cause of this condition is damage to one of the fallopian tubes. A fallopian tube may be narrowed or blocked, and that keeps  the fertilized egg from reaching the uterus. What increases the risk? This condition is more likely to develop in women of childbearing age who have different levels of risk. The levels of risk can be divided into three categories. High risk  You have gone through infertility treatment.  You have had an ectopic pregnancy before.  You have had surgery on the fallopian tubes, or another surgical procedure, such as an abortion.  You have had surgery to have the fallopian tubes tied (tubal ligation).  You have problems or diseases of the fallopian tubes.  You have been exposed to diethylstilbestrol (DES). This medicine was used until 1971, and it had effects on babies whose mothers took the medicine.  You become pregnant while using an IUD (intrauterine device) for birth control. Moderate risk  You have a history of infertility.  You have had an STI (sexually transmitted infection).  You have a history of pelvic inflammatory disease (PID).  You have scarring from endometriosis.  You have multiple sexual partners.  You smoke. Low risk  You have had pelvic surgery.  You use vaginal douches.  You became sexually active before age 59. What are the signs or symptoms? Common symptoms of this condition include normal pregnancy symptoms, such as missing a period, nausea, tiredness, abdominal pain, breast tenderness, and bleeding. However, ectopic pregnancy will have additional symptoms, such as:  Pain with intercourse.  Irregular vaginal bleeding or spotting.  Cramping or pain on one side or in the lower abdomen.  Fast heartbeat, low blood pressure, and sweating.  Passing out while having a bowel movement.  Symptoms of a ruptured ectopic pregnancy and internal bleeding may include:  Sudden, severe pain in the abdomen and pelvis.  Dizziness, weakness, light-headedness, or fainting.  Pain in the shoulder or neck area.  How is this diagnosed? This condition is diagnosed  by:  A pelvic exam to locate pain or a mass in the abdomen.  A pregnancy test. This blood test checks for the presence as well as the specific level of pregnancy hormone in the bloodstream.  Ultrasound. This is performed if a pregnancy test is positive. In this test, a probe is inserted into the vagina. The probe will detect a fetus, possibly in a location other than the uterus.  Taking a sample of uterus tissue (dilation and curettage, or D&C).  Surgery to perform a visual exam of the inside of the abdomen using a thin, lighted tube that has a tiny camera on the end (laparoscope).  Culdocentesis. This procedure involves inserting a needle at the top of the vagina, behind the uterus. If blood is present in this area, it may indicate that a fallopian tube is torn.  How is this treated? This condition is treated with medicine or surgery. Medicine  An injection of a medicine (methotrexate) may be given to cause the pregnancy tissue to be absorbed. This medicine may save your fallopian tube. It may be given if: ? The diagnosis is made early, with no signs of active bleeding. ? The fallopian tube has not ruptured. ? You are considered to be a good candidate for the medicine. Usually, pregnancy hormone blood levels are checked after methotrexate  treatment. This is to be sure that the medicine is effective. It may take 4-6 weeks for the pregnancy to be absorbed. Most pregnancies will be absorbed by 3 weeks. Surgery  A laparoscope may be used to remove the pregnancy tissue.  If severe internal bleeding occurs, a larger cut (incision) may be made in the lower abdomen (laparotomy) to remove the fetus and placenta. This is done to stop the bleeding.  Part or all of the fallopian tube may be removed (salpingectomy) along with the fetus and placenta. The fallopian tube may also be repaired during the surgery.  In very rare circumstances, removal of the uterus (hysterectomy) may be  required.  After surgery, pregnancy hormone testing may be done to be sure that there is no pregnancy tissue left. Whether your treatment is medicine or surgery, you may receive a Rho (D) immune globulin shot to prevent problems with any future pregnancy. This shot may be given if:  You are Rh-negative and the baby's father is Rh-positive.  You are Rh-negative and you do not know the Rh type of the baby's father.  Follow these instructions at home:  Rest and limit your activity after the procedure for as long as told by your health care provider.  Until your health care provider says that it is safe: ? Do not lift anything that is heavier than 10 lb (4.5 kg), or the limit that your health care provider tells you. ? Avoid physical exercise and any movement that requires effort (is strenuous).  To help prevent constipation: ? Eat a healthy diet that includes fruits, vegetables, and whole grains. ? Drink 6-8 glasses of water per day. Get help right away if:  You develop worsening pain that is not relieved by medicine.  You have: ? A fever or chills. ? Vaginal bleeding. ? Redness and swelling at the incision site. ? Nausea and vomiting.  You feel dizzy or weak.  You feel light-headed or you faint. This information is not intended to replace advice given to you by your health care provider. Make sure you discuss any questions you have with your health care provider. Document Released: 03/10/2004 Document Revised: 09/30/2015 Document Reviewed: 09/02/2015 Elsevier Interactive Patient Education  [employer].

## 2016-10-16 LAB — URINE CULTURE: ORGANISM ID, BACTERIA: NO GROWTH

## 2016-10-17 ENCOUNTER — Inpatient Hospital Stay (HOSPITAL_COMMUNITY)
Admission: AD | Admit: 2016-10-17 | Discharge: 2016-10-17 | Disposition: A | Discharge status: Home / Self Care | Payer: Medicaid Other | Source: Ambulatory Visit | Attending: Obstetrics & Gynecology | Admitting: Obstetrics & Gynecology

## 2016-10-17 DIAGNOSIS — O209 Hemorrhage in early pregnancy, unspecified: Secondary | ICD-10-CM

## 2016-10-17 DIAGNOSIS — O3680X0 Pregnancy with inconclusive fetal viability, not applicable or unspecified: Secondary | ICD-10-CM

## 2016-10-17 DIAGNOSIS — O4691 Antepartum hemorrhage, unspecified, first trimester: Secondary | ICD-10-CM | POA: Insufficient documentation

## 2016-10-17 DIAGNOSIS — Z3A01 Less than 8 weeks gestation of pregnancy: Secondary | ICD-10-CM | POA: Diagnosis not present

## 2016-10-17 LAB — HCG, QUANTITATIVE, PREGNANCY: hCG, Beta Chain, Quant, S: 12 m[IU]/mL — ABNORMAL HIGH (ref <5)

## 2016-10-17 NOTE — MAU Provider Note (Signed)
  History     CSN: [id number]  Arrival date and time: 10/17/16 1544   First Provider Initiated Contact with Patient 10/17/16 1727      Chief Complaint  Patient presents with  . Follow-up   SUBJECTIVE: Christy Jackson is a 31 y.o. G1P0 at 6031w3d here for follow-up quantitative beta hCG. Initially seen here 2 days ago for 5 day history of light vaginal bleeding. The bleeding has gotten progressively heavier and now she describes it as a light menstrual period associated with mild suprapubic cramping. Ultrasound 2 days ago showed no sign of intrauterine pregnancy and normal uterus and adnexae.     OB History  Gravida Para Term Preterm AB Living  1            SAB TAB Ectopic Multiple Live Births               # Outcome Date GA Lbr Len/2nd Weight Sex Delivery Anes PTL Lv  1 Current                Past Medical History:  Diagnosis Date  . Medical history non-contributory     Past Surgical History:  Procedure Laterality Date  . NO PAST SURGERIES      No family history on file.  Social History  Substance Use Topics  . Smoking status: Never Smoker  . Smokeless tobacco: Never Used  . Alcohol use No    Allergies: No Known Allergies  Prescriptions Prior to Admission  Medication Sig Dispense Refill Last Dose  . folic acid (FOLVITE) 400 MCG tablet Take 400 mcg by mouth daily.   10/14/2016 at Unknown time    Review of Systems negative Physical Exam   Blood pressure 125/78, pulse 92, temperature 99 F (37.2 C), temperature source Oral, resp. rate 16, last menstrual period 09/09/2016.  Physical Exam Abbreviated exam: No apparent distress. Abdomen soft nontender. MAU Course  Procedures hCG, quantitative, pregnancy  Order: [id number]  Status:  Final result Visible to patient:  No (Not Released) Next appt:  None    Ref Range & Units 16:19 2d ago   hCG, Beta Chain, Quant, S <5 mIU/mL 12   <5 mIU/mL" class="[id number]"> 31CM            Explained to patient EPF with  likelihood of early SAB. If cramping worsens may use ibuprofen 600mg  po q6h alternating with acetaminophen 1000po q6h prn   Assessment and Plan  G1 at 6655w5d 1. First trimester bleeding   2. Pregnancy of unknown anatomic location    Allergies as of 10/17/2016   No Known Allergies     Medication List    TAKE these medications   folic acid 400 MCG tablet Commonly known as:  FOLVITE Take 400 mcg by mouth daily.      Follow-up Information    [facility]   Specialty:  Obstetrics and Gynecology Why:  Someone from outpatient clinic will call you with an appointment for lab only in 1 week and then appointment with a provider in 2 weeks Contact information: [address] [place] [id number] [phone]          Candie Gintz CNM 10/17/2016, 5:28 PM

## 2016-10-17 NOTE — MAU Note (Signed)
Pt here for repeat quant.  Pt denies pain, is bleeding like a period today.

## 2016-10-17 NOTE — Discharge Instructions (Signed)

## 2016-10-18 ENCOUNTER — Ambulatory Visit: Payer: Self-pay | Admitting: Physician Assistant

## 2016-10-18 LAB — BETA HCG QUANT (REF LAB)

## 2016-10-18 LAB — GC/CHLAMYDIA PROBE AMP
Chlamydia trachomatis, NAA: NEGATIVE
Neisseria gonorrhoeae by PCR: NEGATIVE

## 2016-10-19 LAB — BETA HCG QUANT (REF LAB)

## 2016-10-25 ENCOUNTER — Other Ambulatory Visit: Payer: Self-pay

## 2016-10-25 ENCOUNTER — Ambulatory Visit (HOSPITAL_COMMUNITY): Payer: Self-pay | Attending: Family Medicine

## 2016-10-25 DIAGNOSIS — O039 Complete or unspecified spontaneous abortion without complication: Secondary | ICD-10-CM

## 2016-10-26 LAB — BETA HCG QUANT (REF LAB)

## 2016-10-31 ENCOUNTER — Encounter: Payer: Self-pay | Admitting: Obstetrics & Gynecology

## 2016-10-31 ENCOUNTER — Ambulatory Visit (INDEPENDENT_AMBULATORY_CARE_PROVIDER_SITE_OTHER): Payer: Medicaid Other | Admitting: Obstetrics & Gynecology

## 2016-10-31 VITALS — BP 134/88 | HR 72 | Wt 203.5 lb

## 2016-10-31 DIAGNOSIS — Z3169 Encounter for other general counseling and advice on procreation: Secondary | ICD-10-CM | POA: Diagnosis not present

## 2016-10-31 DIAGNOSIS — O039 Complete or unspecified spontaneous abortion without complication: Secondary | ICD-10-CM

## 2016-10-31 NOTE — Patient Instructions (Addendum)
Pap Test Why am I having this test? A pap test is sometimes called a pap smear. It is a screening test that is used to check for signs of cancer of the vagina, cervix, and uterus. The test can also identify the presence of infection or precancerous changes. Your health care provider will likely recommend you have this test done on a regular basis. This test may be done:  Every 3 years, starting at age 31.  Every 5 years, in combination with testing for the presence of human papillomavirus (HPV).  More or less often depending on other medical conditions.  What kind of sample is taken? Using a small cotton swab, plastic spatula, or brush, your health care provider will collect a sample of cells from the surface of your cervix. Your cervix is the opening to your uterus, also called a womb. Secretions from the cervix and vagina may also be collected. How do I prepare for this test?  Be aware of where you are in your menstrual cycle. You may be asked to reschedule the test if you are menstruating on the day of the test.  You may need to reschedule if you have a known vaginal infection on the day of the test.  You may be asked to avoid douching or taking a bath the day before or the day of the test.  Some medicines can cause abnormal test results, such as digitalis and tetracycline. Talk with your health care provider before your test if you take one of these medicines. What do the results mean? Abnormal test results may indicate a number of health conditions. These may include:  Cancer. Although pap test results cannot be used to diagnose cancer of the cervix, vagina, or uterus, they may suggest the possibility of cancer. Further tests would be required to determine if cancer is present.  Sexually transmitted disease.  Fungal infection.  Parasite infection.  Herpes infection.  A condition causing or contributing to infertility.  It is your responsibility to obtain your test results.  Ask the lab or department performing the test when and how you will get your results. Contact your health care provider to discuss any questions you have about your results. Talk with your health care provider to discuss your results, treatment options, and if necessary, the need for more tests. Talk with your health care provider if you have any questions about your results. This information is not intended to replace advice given to you by your health care provider. Make sure you discuss any questions you have with your health care provider. Document Released: 04/23/2002 Document Revised: 10/07/2015 Document Reviewed: 06/24/2013 Elsevier Interactive Patient Education  2018 Elsevier Inc.   Influenza Virus Vaccine injection (Fluarix) What is this medicine? INFLUENZA VIRUS VACCINE (in floo EN zuh VAHY ruhs vak SEEN) helps to reduce the risk of getting influenza also known as the flu. This medicine may be used for other purposes; ask your health care provider or pharmacist if you have questions. COMMON BRAND NAME(S): Fluarix, Fluzone What should I tell my health care provider before I take this medicine? They need to know if you have any of these conditions: -bleeding disorder like hemophilia -fever or infection -Guillain-Barre syndrome or other neurological problems -immune system problems -infection with the human immunodeficiency virus (HIV) or AIDS -low blood platelet counts -multiple sclerosis -an unusual or allergic reaction to influenza virus vaccine, eggs, chicken proteins, latex, gentamicin, other medicines, foods, dyes or preservatives -pregnant or trying to get pregnant -breast-feeding How  should I use this medicine? This vaccine is for injection into a muscle. It is given by a health care professional. A copy of Vaccine Information Statements will be given before each vaccination. Read this sheet carefully each time. The sheet may change frequently. Talk to your pediatrician  regarding the use of this medicine in children. Special care may be needed. Overdosage: If you think you have taken too much of this medicine contact a poison control center or emergency room at once. NOTE: This medicine is only for you. Do not share this medicine with others. What if I miss a dose? This does not apply. What may interact with this medicine? -chemotherapy or radiation therapy -medicines that lower your immune system like etanercept, anakinra, infliximab, and adalimumab -medicines that treat or prevent blood clots like warfarin -phenytoin -steroid medicines like prednisone or cortisone -theophylline -vaccines This list may not describe all possible interactions. Give your health care provider a list of all the medicines, herbs, non-prescription drugs, or dietary supplements you use. Also tell them if you smoke, drink alcohol, or use illegal drugs. Some items may interact with your medicine. What should I watch for while using this medicine? Report any side effects that do not go away within 3 days to your doctor or health care professional. Call your health care provider if any unusual symptoms occur within 6 weeks of receiving this vaccine. You may still catch the flu, but the illness is not usually as bad. You cannot get the flu from the vaccine. The vaccine will not protect against colds or other illnesses that may cause fever. The vaccine is needed every year. What side effects may I notice from receiving this medicine? Side effects that you should report to your doctor or health care professional as soon as possible: -allergic reactions like skin rash, itching or hives, swelling of the face, lips, or tongue Side effects that usually do not require medical attention (report to your doctor or health care professional if they continue or are bothersome): -fever -headache -muscle aches and pains -pain, tenderness, redness, or swelling at site where injected -weak or  tired This list may not describe all possible side effects. Call your doctor for medical advice about side effects. You may report side effects to FDA at 1-800-FDA-1088. Where should I keep my medicine? This vaccine is only given in a clinic, pharmacy, doctor's office, or other health care setting and will not be stored at home. NOTE: This sheet is a summary. It may not cover all possible information. If you have questions about this medicine, talk to your doctor, pharmacist, or health care provider.  2018 Elsevier/Gold Standard (2007-08-29 09:30:40)

## 2016-10-31 NOTE — Progress Notes (Signed)
GYNECOLOGY OFFICE VISIT NOTE  History:  31 y.o. G1P0 here today for follow up after SAB. No complaints.  She want sot attempt conception again.  Supported by family and friends, denies any depressed mood.  She denies any abnormal vaginal discharge, bleeding, pelvic pain or other concerns.   Past Medical History:  Diagnosis Date  . Medical history non-contributory     Past Surgical History:  Procedure Laterality Date  . NO PAST SURGERIES      The following portions of the patient's history were reviewed and updated as appropriate: allergies, current medications, past family history, past medical history, past social history, past surgical history and problem list.   Health Maintenance:  Never had pap smear.  Review of Systems:  Pertinent items noted in HPI and remainder of comprehensive ROS otherwise negative.   Objective:  Physical Exam BP 134/88   Pulse 72   Wt 203 lb 8 oz (92.3 kg)   LMP 09/09/2016   Breastfeeding? Unknown   BMI 33.86 kg/m  CONSTITUTIONAL: Well-developed, well-nourished female in no acute distress.  HENT:  Normocephalic, atraumatic. External right and left ear normal. Oropharynx is clear and moist EYES: Conjunctivae and EOM are normal. Pupils are equal, round, and reactive to light. No scleral icterus.  NECK: Normal range of motion, supple, no masses SKIN: Skin is warm and dry. No rash noted. Not diaphoretic. No erythema. No pallor. NEUROLOGIC: Alert and oriented to person, place, and time. Normal reflexes, muscle tone coordination. No cranial nerve deficit noted. PSYCHIATRIC: Normal mood and affect. Normal behavior. Normal judgment and thought content. CARDIOVASCULAR: Normal heart rate noted RESPIRATORY: Effort and breath sounds normal, no problems with respiration noted ABDOMEN: Soft, no distention noted.   PELVIC: Deferred MUSCULOSKELETAL: Normal range of motion. No edema noted.  Labs and Imaging  Results for MELLODY, MASRI (MRN [id number]) as of  10/31/2016 19:17  Ref. Range 10/15/2016 16:15 10/15/2016 16:58 10/15/2016 17:46 10/17/2016 16:19 10/25/2016 14:42  HCG, Beta Chain, Quant, S Latest Ref Range: <5 mIU/mL 31 (H)   12 (H)   hCG Quant Latest Units: mIU/mL     <1   US Ob Comp Less 14 Wks  Result Date: 10/15/2016 CLINICAL DATA:  Bleeding for 5 days. EXAM: OBSTETRIC <14 WK [place] AND TRANSVAGINAL OB US TECHNIQUE: Both transabdominal and transvaginal ultrasound examinations were performed for complete evaluation of the gestation as well as the maternal uterus, adnexal regions, and pelvic cul-de-sac. Transvaginal technique was performed to assess early pregnancy. COMPARISON:  None. FINDINGS: Intrauterine gestational sac: None Yolk sac:  Absent Embryo:  Absent Cardiac Activity: Absent Heart Rate: Absent  bpm MSD:   mm    w     d CRL:    mm    w    d                  [place] EDC: Subchorionic hemorrhage:  Not applicable Maternal uterus/adnexae:  Normal IMPRESSION: No intrauterine gestation identified. The uterus and adnexa are otherwise normal. Electronically Signed   By: Sherian Rein M.D.   On: 10/15/2016 17:52   US Ob Transvaginal  Result Date: 10/15/2016 CLINICAL DATA:  Bleeding for 5 days. EXAM: OBSTETRIC <14 WK [place] AND TRANSVAGINAL OB US TECHNIQUE: Both transabdominal and transvaginal ultrasound examinations were performed for complete evaluation of the gestation as well as the maternal uterus, adnexal regions, and pelvic cul-de-sac. Transvaginal technique was performed to assess early pregnancy. COMPARISON:  None. FINDINGS: Intrauterine gestational sac: None Yolk sac:  Absent Embryo:  Absent Cardiac Activity: Absent Heart Rate: Absent  bpm MSD:   mm    w     d CRL:    mm    w    d                  [place] EDC: Subchorionic hemorrhage:  Not applicable Maternal uterus/adnexae:  Normal IMPRESSION: No intrauterine gestation identified. The uterus and adnexa are otherwise normal. Electronically Signed   By: Sherian Rein M.D.   On: 10/15/2016 17:52    Assessment & Plan:    1. Spontaneous miscarriage 2. Encounter for preconception consultation Reassured about chances of normal conception. Advised to continue vitamins with folic acid, avoid teratogens, eat health diet and have normal exercise.  Patient will let us know if she has any other issues. Routine preventative health maintenance measures emphasized; never had pap smear, this was recommended. Please refer to After Visit Summary for other counseling recommendations.    Total face-to-face time with patient: 15 minutes. Over 50% of encounter was spent on counseling and coordination of care.   Jaynie Collins, MD, FACOG Attending Obstetrician & Gynecologist, [facility] for [employer], [facility]

## 2016-12-23 ENCOUNTER — Inpatient Hospital Stay (HOSPITAL_COMMUNITY)
Admission: AD | Admit: 2016-12-23 | Discharge: 2016-12-23 | Disposition: A | Discharge status: Home / Self Care | Payer: Medicaid Other | Source: Ambulatory Visit | Attending: Obstetrics & Gynecology | Admitting: Obstetrics & Gynecology

## 2016-12-23 ENCOUNTER — Encounter (HOSPITAL_COMMUNITY): Payer: Self-pay | Admitting: *Deleted

## 2016-12-23 DIAGNOSIS — G44209 Tension-type headache, unspecified, not intractable: Secondary | ICD-10-CM

## 2016-12-23 DIAGNOSIS — Z3202 Encounter for pregnancy test, result negative: Secondary | ICD-10-CM | POA: Diagnosis not present

## 2016-12-23 DIAGNOSIS — R42 Dizziness and giddiness: Secondary | ICD-10-CM

## 2016-12-23 DIAGNOSIS — I959 Hypotension, unspecified: Secondary | ICD-10-CM | POA: Insufficient documentation

## 2016-12-23 DIAGNOSIS — R51 Headache: Secondary | ICD-10-CM | POA: Insufficient documentation

## 2016-12-23 LAB — CBC WITH DIFFERENTIAL/PLATELET
BASOS PCT: 0 %
Basophils Absolute: 0 10*3/uL (ref 0.0–0.1)
EOS ABS: 0.1 10*3/uL (ref 0.0–0.7)
Eosinophils Relative: 1 %
HCT: 35.5 % — ABNORMAL LOW (ref 36.0–46.0)
HEMOGLOBIN: 12.3 g/dL (ref 12.0–15.0)
Lymphocytes Relative: 51 %
Lymphs Abs: 4.4 10*3/uL — ABNORMAL HIGH (ref 0.7–4.0)
MCH: 28.9 pg (ref 26.0–34.0)
MCHC: 34.6 g/dL (ref 30.0–36.0)
MCV: 83.3 fL (ref 78.0–100.0)
Monocytes Absolute: 0.4 10*3/uL (ref 0.1–1.0)
Monocytes Relative: 4 %
NEUTROS PCT: 44 %
Neutro Abs: 3.8 10*3/uL (ref 1.7–7.7)
PLATELETS: 263 10*3/uL (ref 150–400)
RBC: 4.26 MIL/uL (ref 3.87–5.11)
RDW: 13.2 % (ref 11.5–15.5)
WBC: 8.8 10*3/uL (ref 4.0–10.5)

## 2016-12-23 LAB — URINALYSIS, ROUTINE W REFLEX MICROSCOPIC
Bilirubin Urine: NEGATIVE
Glucose, UA: NEGATIVE mg/dL
Hgb urine dipstick: NEGATIVE
KETONES UR: NEGATIVE mg/dL
Nitrite: NEGATIVE
PROTEIN: NEGATIVE mg/dL
Specific Gravity, Urine: 1.008 (ref 1.005–1.030)
pH: 7 (ref 5.0–8.0)

## 2016-12-23 LAB — GLUCOSE, CAPILLARY: GLUCOSE-CAPILLARY: 113 mg/dL — AB (ref 65–99)

## 2016-12-23 LAB — POCT PREGNANCY, URINE: Preg Test, Ur: NEGATIVE

## 2016-12-23 MED ORDER — BUTALBITAL-APAP-CAFFEINE 50-325-40 MG PO TABS
1.0000 | ORAL_TABLET | Freq: Once | ORAL | Status: AC
  Administered 2016-12-23: 1 via ORAL
  Filled 2016-12-23: qty 1

## 2016-12-23 MED ORDER — BUTALBITAL-APAP-CAFFEINE 50-325-40 MG PO TABS: 1.0000 | ORAL_TABLET | Freq: Four times a day (QID) | ORAL | 0 refills | Status: DC | PRN

## 2016-12-23 NOTE — Discharge Instructions (Signed)

## 2016-12-23 NOTE — MAU Note (Signed)
Pt reports for the last 3 days she has had a headache, she feels weak and dizzy also.

## 2016-12-23 NOTE — MAU Provider Note (Signed)
History     CSN: [id number]  Arrival date and time: 12/23/16 1658   First Provider Initiated Contact with Patient 12/23/16 2154      Chief Complaint  Patient presents with  . Headache  . Fatigue   HPI [name] is a 31 y.o. G1P0010 who presents to MAU today with complaint of headache and dizziness x 3 days. She tried Advil this morning with relief, but headache returned. She rates her pain at 9/10 now. She had SAB in mid-September and hCG was followed to < 1. She states normal periods since then. She denies bleeding today.    OB History    Gravida Para Term Preterm AB Living   1       1     SAB TAB Ectopic Multiple Live Births   1              Past Medical History:  Diagnosis Date  . Medical history non-contributory     Past Surgical History:  Procedure Laterality Date  . NO PAST SURGERIES      History reviewed. No pertinent family history.  Social History   Tobacco Use  . Smoking status: Never Smoker  . Smokeless tobacco: Never Used  Substance Use Topics  . Alcohol use: No  . Drug use: No    Allergies: No Known Allergies  Medications Prior to Admission  Medication Sig Dispense Refill Last Dose  . ibuprofen (ADVIL,MOTRIN) 200 MG tablet Take 200 mg every 6 (six) hours as needed by mouth.   12/23/2016 at Unknown time  . folic acid (FOLVITE) 400 MCG tablet Take 400 mcg by mouth daily.   More than a month at Unknown time    Review of Systems  Constitutional: Positive for fatigue. Negative for fever.  Gastrointestinal: Negative for abdominal pain, constipation, diarrhea, nausea and vomiting.  Genitourinary: Negative for vaginal bleeding and vaginal discharge.  Neurological: Positive for dizziness, weakness and headaches. Negative for syncope.   Physical Exam   Blood pressure 116/82, pulse 85, temperature 98.7 F (37.1 C), temperature source Oral, resp. rate 16, height 5\' 5"  (1.651 m), weight 209 lb (94.8 kg), last menstrual period 11/28/2016, SpO2  97 %, unknown if currently breastfeeding.  Physical Exam  Nursing note and vitals reviewed. Constitutional: She is oriented to person, place, and time. She appears well-developed and well-nourished. No distress.  HENT:  Head: Normocephalic and atraumatic.  Cardiovascular: Normal rate.  Respiratory: Effort normal.  GI: Soft. She exhibits no distension.  Neurological: She is alert and oriented to person, place, and time.  Skin: Skin is warm and dry. No erythema.  Psychiatric: She has a normal mood and affect.     Results for orders placed or performed during the hospital encounter of 12/23/16 (from the past 24 hour(s))  Urinalysis, Routine w reflex microscopic     Status: Abnormal   Collection Time: 12/23/16  5:14 PM  Result Value Ref Range   Color, Urine STRAW (A) YELLOW   APPearance CLEAR CLEAR   Specific Gravity, Urine 1.008 1.005 - 1.030   pH 7.0 5.0 - 8.0   Glucose, UA NEGATIVE NEGATIVE mg/dL   Hgb urine dipstick NEGATIVE NEGATIVE   Bilirubin Urine NEGATIVE NEGATIVE   Ketones, ur NEGATIVE NEGATIVE mg/dL   Protein, ur NEGATIVE NEGATIVE mg/dL   Nitrite NEGATIVE NEGATIVE   Leukocytes, UA SMALL (A) NEGATIVE   RBC / HPF 0-5 0 - 5 RBC/hpf   WBC, UA 0-5 0 - 5 WBC/hpf   Bacteria,  UA RARE (A) NONE SEEN   Squamous Epithelial / LPF 0-5 (A) NONE SEEN  Pregnancy, urine POC     Status: None   Collection Time: 12/23/16  5:35 PM  Result Value Ref Range   Preg Test, Ur NEGATIVE NEGATIVE  CBC with Differential/Platelet     Status: Abnormal   Collection Time: 12/23/16  9:37 PM  Result Value Ref Range   WBC 8.8 4.0 - 10.5 K/uL   RBC 4.26 3.87 - 5.11 MIL/uL   Hemoglobin 12.3 12.0 - 15.0 g/dL   HCT 16.135.5 (L) 09.636.0 - 04.546.0 %   MCV 83.3 78.0 - 100.0 fL   MCH 28.9 26.0 - 34.0 pg   MCHC 34.6 30.0 - 36.0 g/dL   RDW 40.913.2 81.111.5 - 91.415.5 %   Platelets 263 150 - 400 K/uL   Neutrophils Relative % 44 %   Neutro Abs 3.8 1.7 - 7.7 K/uL   Lymphocytes Relative 51 %   Lymphs Abs 4.4 (H) 0.7 - 4.0 K/uL    Monocytes Relative 4 %   Monocytes Absolute 0.4 0.1 - 1.0 K/uL   Eosinophils Relative 1 %   Eosinophils Absolute 0.1 0.0 - 0.7 K/uL   Basophils Relative 0 %   Basophils Absolute 0.0 0.0 - 0.1 K/uL    MAU Course  Procedures None  MDM UPT - negative UA, CBC, CBG today  Orthostatic VS - normal Fioricet given for headache PO hydration  Improvement in headahce with Fioricet, now rates pain at 5/10 Assessment and Plan  A: Headache Hypotension   P:  Discharge home Rx for Fioricet given  Ibuprofen PRN otherwise Warning signs for worsening conditoin discussed Patient advised to follow-up with PCP of choice, contact information for [facility] given  Patient may return to MAU as needed or if her condition were to change or worsen   Vonzella NippleJulie Wenzel, PA-C 12/23/2016, 9:54 PM

## 2016-12-25 LAB — URINE CULTURE: Culture: <10000 — AB

## 2016-12-29 ENCOUNTER — Encounter (HOSPITAL_COMMUNITY): Payer: Self-pay | Admitting: *Deleted

## 2016-12-29 ENCOUNTER — Inpatient Hospital Stay (HOSPITAL_COMMUNITY): Payer: Medicaid Other

## 2016-12-29 ENCOUNTER — Inpatient Hospital Stay (HOSPITAL_COMMUNITY)
Admission: AD | Admit: 2016-12-29 | Discharge: 2016-12-29 | Disposition: A | Discharge status: Home / Self Care | Payer: Medicaid Other | Source: Ambulatory Visit | Attending: Obstetrics and Gynecology | Admitting: Obstetrics and Gynecology

## 2016-12-29 DIAGNOSIS — O26859 Spotting complicating pregnancy, unspecified trimester: Secondary | ICD-10-CM | POA: Diagnosis not present

## 2016-12-29 DIAGNOSIS — Z3A01 Less than 8 weeks gestation of pregnancy: Secondary | ICD-10-CM | POA: Diagnosis not present

## 2016-12-29 DIAGNOSIS — O3680X0 Pregnancy with inconclusive fetal viability, not applicable or unspecified: Secondary | ICD-10-CM

## 2016-12-29 DIAGNOSIS — O209 Hemorrhage in early pregnancy, unspecified: Secondary | ICD-10-CM | POA: Diagnosis not present

## 2016-12-29 LAB — URINALYSIS, ROUTINE W REFLEX MICROSCOPIC
BILIRUBIN URINE: NEGATIVE
Glucose, UA: NEGATIVE mg/dL
Ketones, ur: NEGATIVE mg/dL
Nitrite: NEGATIVE
PROTEIN: NEGATIVE mg/dL
SPECIFIC GRAVITY, URINE: 1.021 (ref 1.005–1.030)
pH: 5 (ref 5.0–8.0)

## 2016-12-29 LAB — CBC
HEMATOCRIT: 38.2 % (ref 36.0–46.0)
HEMOGLOBIN: 12.6 g/dL (ref 12.0–15.0)
MCH: 29 pg (ref 26.0–34.0)
MCHC: 33 g/dL (ref 30.0–36.0)
MCV: 88 fL (ref 78.0–100.0)
Platelets: 292 10*3/uL (ref 150–400)
RBC: 4.34 MIL/uL (ref 3.87–5.11)
RDW: 13.1 % (ref 11.5–15.5)
WBC: 6.7 10*3/uL (ref 4.0–10.5)

## 2016-12-29 LAB — HCG, QUANTITATIVE, PREGNANCY: hCG, Beta Chain, Quant, S: 1153 m[IU]/mL — ABNORMAL HIGH (ref <5)

## 2016-12-29 LAB — ABO/RH: ABO/RH(D): O POS

## 2016-12-29 LAB — POCT PREGNANCY, URINE: PREG TEST UR: POSITIVE — AB

## 2016-12-29 NOTE — MAU Note (Signed)
Pt reports positive preg test at 11/14, spotting today. Denies pain.

## 2016-12-29 NOTE — MAU Provider Note (Signed)
History     CSN: [id number]  Arrival date and time: 12/29/16 1012  Provider first contact with patient 1415     Chief Complaint  Patient presents with  . Possible Pregnancy  . Vaginal Bleeding   HPI  [name] is a 31 y.o. G2P0010 at [redacted]w[redacted]d gestation by LMP presenting to MAU with complaints of (+) HPT and pink spotting this morning. She is concerned about her "pregnancy hormones" because she had a miscarriage before after having "low" hormone levels.   Past Medical History:  Diagnosis Date  . Medical history non-contributory     Past Surgical History:  Procedure Laterality Date  . NO PAST SURGERIES      No family history on file.  Social History   Tobacco Use  . Smoking status: Never Smoker  . Smokeless tobacco: Never Used  Substance Use Topics  . Alcohol use: No  . Drug use: No    Allergies: No Known Allergies  Medications Prior to Admission  Medication Sig Dispense Refill Last Dose  . butalbital-acetaminophen-caffeine (FIORICET, ESGIC) 50-325-40 MG tablet Take 1 tablet every 6 (six) hours as needed by mouth for headache. 20 tablet 0   . folic acid (FOLVITE) 400 MCG tablet Take 400 mcg by mouth daily.   More than a month at Unknown time  . ibuprofen (ADVIL,MOTRIN) 200 MG tablet Take 200 mg every 6 (six) hours as needed by mouth.   12/23/2016 at Unknown time    Review of Systems  Constitutional: Negative.   HENT: Negative.   Eyes: Negative.   Respiratory: Negative.   Cardiovascular: Negative.   Gastrointestinal: Negative.   Endocrine: Negative.   Genitourinary: Positive for vaginal bleeding ("very small amount of pink spotting with wiping").  Musculoskeletal: Negative.   Skin: Negative.   Allergic/Immunologic: Negative.   Neurological: Negative.   Hematological: Negative.   Psychiatric/Behavioral: Negative.    Physical Exam   Blood pressure 104/64, pulse 83, temperature 98.4 F (36.9 C), temperature source Oral, resp. rate 18, height 5\' 5"   (1.651 m), weight 205 lb (93 kg), last menstrual period 11/28/2016, SpO2 100 %.  Physical Exam  Constitutional: She is oriented to person, place, and time. She appears well-developed and well-nourished.  HENT:  Head: Normocephalic.  Eyes: Pupils are equal, round, and reactive to light.  Neck: Normal range of motion.  Respiratory: Effort normal.  Genitourinary:  Genitourinary Comments: Pelvic deferred  Musculoskeletal: Normal range of motion.  Neurological: She is alert and oriented to person, place, and time.  Skin: Skin is warm and dry.  Psychiatric: She has a normal mood and affect. Her behavior is normal. Judgment and thought content normal.    MAU Course  Procedures  MDM [facility] UPT CBC HCG ABO/Rh OB U/S < 14 wks OB Transvaginal U/S   ** Patient requests to leave before CNM able to perform pelvic exam and cultures. She state her "husband has to get to work". Patient's only request was to know her HCG level and if it was normal. Discussed with patient that we cannot determine if this is a normal pregnancy at this time d/t U/S results. Advised to return for repeat HCG in 48 hrs -- pt agrees with that plan, but declines pelvic exam at this time.  Results for orders placed or performed during the hospital encounter of 12/29/16 (from the past 24 hour(s))  Urinalysis, Routine w reflex microscopic     Status: Abnormal   Collection Time: 12/29/16 10:52 AM  Result Value Ref Range  Color, Urine YELLOW YELLOW   APPearance CLOUDY (A) CLEAR   Specific Gravity, Urine 1.021 1.005 - 1.030   pH 5.0 5.0 - 8.0   Glucose, UA NEGATIVE NEGATIVE mg/dL   Hgb urine dipstick MODERATE (A) NEGATIVE   Bilirubin Urine NEGATIVE NEGATIVE   Ketones, ur NEGATIVE NEGATIVE mg/dL   Protein, ur NEGATIVE NEGATIVE mg/dL   Nitrite NEGATIVE NEGATIVE   Leukocytes, UA MODERATE (A) NEGATIVE   RBC / HPF 0-5 0 - 5 RBC/hpf   WBC, UA 6-30 0 - 5 WBC/hpf   Bacteria, UA RARE (A) NONE SEEN   Squamous Epithelial / LPF  6-30 (A) NONE SEEN   Mucus PRESENT   Pregnancy, urine POC     Status: Abnormal   Collection Time: 12/29/16 11:28 AM  Result Value Ref Range   Preg Test, Ur POSITIVE (A) NEGATIVE  CBC     Status: None   Collection Time: 12/29/16 11:34 AM  Result Value Ref Range   WBC 6.7 4.0 - 10.5 K/uL   RBC 4.34 3.87 - 5.11 MIL/uL   Hemoglobin 12.6 12.0 - 15.0 g/dL   HCT 21.338.2 08.636.0 - 57.846.0 %   MCV 88.0 78.0 - 100.0 fL   MCH 29.0 26.0 - 34.0 pg   MCHC 33.0 30.0 - 36.0 g/dL   RDW 46.913.1 62.911.5 - 52.815.5 %   Platelets 292 150 - 400 K/uL  ABO/Rh     Status: None   Collection Time: 12/29/16 11:34 AM  Result Value Ref Range   ABO/RH(D) O POS   hCG, quantitative, pregnancy     Status: Abnormal   Collection Time: 12/29/16 11:34 AM  Result Value Ref Range   hCG, Beta Chain, Quant, S 1,153 (H) <5 mIU/mL   [place] Ob Comp Less 14 Wks  Result Date: 12/29/2016 CLINICAL DATA:  Pregnant patient with vaginal spotting. EXAM: OBSTETRIC <14 WK US AND TRANSVAGINAL OB US TECHNIQUE: Both transabdominal and transvaginal ultrasound examinations were performed for complete evaluation of the gestation as well as the maternal uterus, adnexal regions, and pelvic cul-de-sac. Transvaginal technique was performed to assess early pregnancy. COMPARISON:  Pelvic ultrasound 10/15/2016 FINDINGS: Intrauterine gestational sac: None Yolk sac:  Not Visualized. Embryo:  Not Visualized. Cardiac Activity: Not Visualized. Maternal uterus/adnexae: Normal right and left ovaries. Small corpus luteum in the right ovary. No free fluid in the pelvis. No adnexal mass is identified. IMPRESSION: No intrauterine gestation identified. In the setting of positive pregnancy test and no definite intrauterine pregnancy, this reflects a pregnancy of unknown location. Differential considerations include early normal IUP, abnormal IUP, or nonvisualized ectopic pregnancy. Differentiation is achieved with serial beta HCG supplemented by repeat sonography as clinically warranted.  Electronically Signed   By: Annia Beltrew  Davis M.D.   On: 12/29/2016 14:10   [place] Ob Transvaginal  Result Date: 12/29/2016 CLINICAL DATA:  Pregnant patient with vaginal spotting. EXAM: OBSTETRIC <14 WK US AND TRANSVAGINAL OB US TECHNIQUE: Both transabdominal and transvaginal ultrasound examinations were performed for complete evaluation of the gestation as well as the maternal uterus, adnexal regions, and pelvic cul-de-sac. Transvaginal technique was performed to assess early pregnancy. COMPARISON:  Pelvic ultrasound 10/15/2016 FINDINGS: Intrauterine gestational sac: None Yolk sac:  Not Visualized. Embryo:  Not Visualized. Cardiac Activity: Not Visualized. Maternal uterus/adnexae: Normal right and left ovaries. Small corpus luteum in the right ovary. No free fluid in the pelvis. No adnexal mass is identified. IMPRESSION: No intrauterine gestation identified. In the setting of positive pregnancy test and no definite intrauterine pregnancy, this reflects  a pregnancy of unknown location. Differential considerations include early normal IUP, abnormal IUP, or nonvisualized ectopic pregnancy. Differentiation is achieved with serial beta HCG supplemented by repeat sonography as clinically warranted. Electronically Signed   By: Annia Beltrew  Davis M.D.   On: 12/29/2016 14:10    Assessment and Plan  Pregnancy of unknown anatomic location - Advised to return to MAU for repeat HCG on 11/17  - Return to care   If you have heavier bleeding that soaks through more that 2 pads per hour for an hour or more  If you bleed so much that you feel like you might pass out or you do pass out  If you have significant abdominal pain that is not improved with Tylenol   If you develop a fever > 100.5   Spotting affecting pregnancy - Bleeding precautions reviewed  Discharge home Patient verbalized an understanding of the plan of care and agrees.   Raelyn Moraolitta Kinjal Neitzke, MSN, CNM 12/29/2016, 2:30 PM

## 2016-12-29 NOTE — Discharge Instructions (Signed)
Return to care  °· If you have heavier bleeding that soaks through more that 2 pads per hour for an hour or more °· If you bleed so much that you feel like you might pass out or you do pass out °· If you have significant abdominal pain that is not improved with Tylenol  °· If you develop a fever > 100.5 ° °

## 2016-12-31 ENCOUNTER — Inpatient Hospital Stay (HOSPITAL_COMMUNITY): Payer: Medicaid Other

## 2016-12-31 ENCOUNTER — Encounter (HOSPITAL_COMMUNITY): Payer: Self-pay | Admitting: *Deleted

## 2016-12-31 ENCOUNTER — Inpatient Hospital Stay (HOSPITAL_COMMUNITY)
Admission: AD | Admit: 2016-12-31 | Discharge: 2016-12-31 | Disposition: A | Discharge status: Home / Self Care | Payer: Medicaid Other | Source: Ambulatory Visit | Attending: Obstetrics & Gynecology | Admitting: Obstetrics & Gynecology

## 2016-12-31 DIAGNOSIS — O3680X0 Pregnancy with inconclusive fetal viability, not applicable or unspecified: Secondary | ICD-10-CM

## 2016-12-31 DIAGNOSIS — O26891 Other specified pregnancy related conditions, first trimester: Secondary | ICD-10-CM

## 2016-12-31 DIAGNOSIS — R109 Unspecified abdominal pain: Secondary | ICD-10-CM

## 2016-12-31 DIAGNOSIS — O00201 Right ovarian pregnancy without intrauterine pregnancy: Secondary | ICD-10-CM | POA: Diagnosis not present

## 2016-12-31 DIAGNOSIS — O00209 Unspecified ovarian pregnancy without intrauterine pregnancy: Secondary | ICD-10-CM | POA: Diagnosis present

## 2016-12-31 LAB — COMPREHENSIVE METABOLIC PANEL
ALK PHOS: 85 U/L (ref 38–126)
ALT: 18 U/L (ref 14–54)
ANION GAP: 7 (ref 5–15)
AST: 21 U/L (ref 15–41)
Albumin: 3.9 g/dL (ref 3.5–5.0)
BILIRUBIN TOTAL: 0.2 mg/dL — AB (ref 0.3–1.2)
BUN: 9 mg/dL (ref 6–20)
CALCIUM: 9 mg/dL (ref 8.9–10.3)
CO2: 25 mmol/L (ref 22–32)
CREATININE: 0.45 mg/dL (ref 0.44–1.00)
Chloride: 101 mmol/L (ref 101–111)
GFR calc non Af Amer: >60 mL/min (ref >60)
Glucose, Bld: 125 mg/dL — ABNORMAL HIGH (ref 65–99)
Potassium: 3.8 mmol/L (ref 3.5–5.1)
SODIUM: 133 mmol/L — AB (ref 135–145)
TOTAL PROTEIN: 7.4 g/dL (ref 6.5–8.1)

## 2016-12-31 LAB — HCG, QUANTITATIVE, PREGNANCY: HCG, BETA CHAIN, QUANT, S: 2001 m[IU]/mL — AB (ref <5)

## 2016-12-31 MED ORDER — ONDANSETRON HCL 4 MG PO TABS
4.0000 mg | ORAL_TABLET | Freq: Once | ORAL | Status: AC
  Administered 2016-12-31: 4 mg via ORAL
  Filled 2016-12-31: qty 1

## 2016-12-31 MED ORDER — ONDANSETRON HCL 4 MG PO TABS: 4.0000 mg | ORAL_TABLET | Freq: Three times a day (TID) | ORAL | 0 refills | Status: DC | PRN

## 2016-12-31 MED ORDER — OXYCODONE-ACETAMINOPHEN 5-325 MG PO TABS: 1.0000 | ORAL_TABLET | Freq: Four times a day (QID) | ORAL | 0 refills | Status: DC | PRN

## 2016-12-31 MED ORDER — METHOTREXATE INJECTION FOR WOMEN'S HOSPITAL
50.0000 mg/m2 | Freq: Once | INTRAMUSCULAR | Status: AC
  Administered 2016-12-31: 105 mg via INTRAMUSCULAR
  Filled 2016-12-31: qty 2.1

## 2016-12-31 MED ORDER — OXYCODONE-ACETAMINOPHEN 5-325 MG PO TABS
1.0000 | ORAL_TABLET | ORAL | Status: DC | PRN
  Administered 2016-12-31 (×2): 1 via ORAL
  Filled 2016-12-31 (×2): qty 1

## 2016-12-31 NOTE — MAU Note (Signed)
Pt repros pain and bleeding started about 1 hour ago. Was here on 11/15 and told to come back in 2 days (today) for a repeat BHCG.

## 2016-12-31 NOTE — Discharge Instructions (Signed)
RETURN TO MAU ON Tuesday, NOV 20 and Saturday, NOV 24 for repeat blood work.   Methotrexate Treatment for an Ectopic Pregnancy, Care After Refer to this sheet in the next few weeks. These instructions provide you with information on caring for yourself after your procedure. Your health care provider may also give you more specific instructions. Your treatment has been planned according to current medical practices, but problems sometimes occur. Call your health care provider if you have any problems or questions after your procedure. What can I expect after the procedure? You may have some abdominal cramping, vaginal bleeding, and fatigue in the first few days after taking methotrexate. Some other possible side effects of methotrexate include:  Nausea.  Vomiting.  Diarrhea.  Mouth sores.  Swelling or irritation of the lining of your lungs (pneumonitis).  Liver damage.  Hair loss.  Follow these instructions at home: After you have received the methotrexate medicine, you need to be careful of your activities and watch your condition for several weeks. It may take 1 week before your hormone levels return to normal. Activity  Do not have sexual intercourse until your health care provider says it is safe to do so.  You may resume your usual diet.  Limit strenuous activity.  Do not drink alcohol. General instructions  Do not take aspirin, ibuprofen, or naproxen (nonsteroidal anti-inflammatory drugs [NSAIDs]).  Do not take folic acid, prenatal vitamins, or other vitamins that contain folic acid.  Avoid traveling too far away from your health care provider.  Keep all follow-up visits as told by your health care provider. This is important. Contact a health care provider if:  You cannot control your nausea and vomiting.  You cannot control your diarrhea.  You have sores in your mouth and want treatment.  You need pain medicine for your abdominal pain.  You have a  rash.  You are having a reaction to the medicine. Get help right away if:  You have increasing abdominal or pelvic pain.  You notice increased bleeding.  You feel light-headed, or you faint.  You have shortness of breath.  Your heart rate increases.  You have a cough.  You have chills.  You have a fever. This information is not intended to replace advice given to you by your health care provider. Make sure you discuss any questions you have with your health care provider. Document Released: 01/20/2011 Document Revised: 07/09/2015 Document Reviewed: 11/19/2012 Elsevier Interactive Patient Education  2017 [employer]  Ectopic Pregnancy An ectopic pregnancy is when the fertilized egg attaches (implants) outside the uterus. Most ectopic pregnancies occur in one of the tubes where eggs travel from the ovary to the uterus (fallopian tubes), but the implanting can occur in other locations. In rare cases, ectopic pregnancies occur on the ovary, intestine, pelvis, abdomen, or cervix. In an ectopic pregnancy, the fertilized egg does not have the ability to develop into a normal, healthy baby. A ruptured ectopic pregnancy is one in which tearing or bursting of a fallopian tube causes internal bleeding. Often, there is intense lower abdominal pain, and vaginal bleeding sometimes occurs. Having an ectopic pregnancy can be life-threatening. If this dangerous condition is not treated, it can lead to blood loss, shock, or even death. What are the causes? The most common cause of this condition is damage to one of the fallopian tubes. A fallopian tube may be narrowed or blocked, and that keeps the fertilized egg from reaching the uterus. What increases the risk? This condition  is more likely to develop in women of childbearing age who have different levels of risk. The levels of risk can be divided into three categories. High risk  You have gone through infertility treatment.  You have had an  ectopic pregnancy before.  You have had surgery on the fallopian tubes, or another surgical procedure, such as an abortion.  You have had surgery to have the fallopian tubes tied (tubal ligation).  You have problems or diseases of the fallopian tubes.  You have been exposed to diethylstilbestrol (DES). This medicine was used until 1971, and it had effects on babies whose mothers took the medicine.  You become pregnant while using an IUD (intrauterine device) for birth control. Moderate risk  You have a history of infertility.  You have had an STI (sexually transmitted infection).  You have a history of pelvic inflammatory disease (PID).  You have scarring from endometriosis.  You have multiple sexual partners.  You smoke. Low risk  You have had pelvic surgery.  You use vaginal douches.  You became sexually active before age 31. What are the signs or symptoms? Common symptoms of this condition include normal pregnancy symptoms, such as missing a period, nausea, tiredness, abdominal pain, breast tenderness, and bleeding. However, ectopic pregnancy will have additional symptoms, such as:  Pain with intercourse.  Irregular vaginal bleeding or spotting.  Cramping or pain on one side or in the lower abdomen.  Fast heartbeat, low blood pressure, and sweating.  Passing out while having a bowel movement.  Symptoms of a ruptured ectopic pregnancy and internal bleeding may include:  Sudden, severe pain in the abdomen and pelvis.  Dizziness, weakness, light-headedness, or fainting.  Pain in the shoulder or neck area.  How is this diagnosed? This condition is diagnosed by:  A pelvic exam to locate pain or a mass in the abdomen.  A pregnancy test. This blood test checks for the presence as well as the specific level of pregnancy hormone in the bloodstream.  Ultrasound. This is performed if a pregnancy test is positive. In this test, a probe is inserted into the vagina.  The probe will detect a fetus, possibly in a location other than the uterus.  Taking a sample of uterus tissue (dilation and curettage, or D&C).  Surgery to perform a visual exam of the inside of the abdomen using a thin, lighted tube that has a tiny camera on the end (laparoscope).  Culdocentesis. This procedure involves inserting a needle at the top of the vagina, behind the uterus. If blood is present in this area, it may indicate that a fallopian tube is torn.  How is this treated? This condition is treated with medicine or surgery. Medicine  An injection of a medicine (methotrexate) may be given to cause the pregnancy tissue to be absorbed. This medicine may save your fallopian tube. It may be given if: ? The diagnosis is made early, with no signs of active bleeding. ? The fallopian tube has not ruptured. ? You are considered to be a good candidate for the medicine. Usually, pregnancy hormone blood levels are checked after methotrexate treatment. This is to be sure that the medicine is effective. It may take 4-6 weeks for the pregnancy to be absorbed. Most pregnancies will be absorbed by 3 weeks. Surgery  A laparoscope may be used to remove the pregnancy tissue.  If severe internal bleeding occurs, a larger cut (incision) may be made in the lower abdomen (laparotomy) to remove the fetus and placenta.  This is done to stop the bleeding.  Part or all of the fallopian tube may be removed (salpingectomy) along with the fetus and placenta. The fallopian tube may also be repaired during the surgery.  In very rare circumstances, removal of the uterus (hysterectomy) may be required.  After surgery, pregnancy hormone testing may be done to be sure that there is no pregnancy tissue left. Whether your treatment is medicine or surgery, you may receive a Rho (D) immune globulin shot to prevent problems with any future pregnancy. This shot may be given if:  You are Rh-negative and the baby's  father is Rh-positive.  You are Rh-negative and you do not know the Rh type of the baby's father.  Follow these instructions at home:  Rest and limit your activity after the procedure for as long as told by your health care provider.  Until your health care provider says that it is safe: ? Do not lift anything that is heavier than 10 lb (4.5 kg), or the limit that your health care provider tells you. ? Avoid physical exercise and any movement that requires effort (is strenuous).  To help prevent constipation: ? Eat a healthy diet that includes fruits, vegetables, and whole grains. ? Drink 6-8 glasses of water per day. Get help right away if:  You develop worsening pain that is not relieved by medicine.  You have: ? A fever or chills. ? Vaginal bleeding. ? Redness and swelling at the incision site. ? Nausea and vomiting.  You feel dizzy or weak.  You feel light-headed or you faint. This information is not intended to replace advice given to you by your health care provider. Make sure you discuss any questions you have with your health care provider. Document Released: 03/10/2004 Document Revised: 09/30/2015 Document Reviewed: 09/02/2015 Elsevier Interactive Patient Education  [employer]2018 [employer]

## 2016-12-31 NOTE — Progress Notes (Signed)
Pt requested another dose of pain medicine to help her thru the night before getting script filled and also dose med for nausea.

## 2016-12-31 NOTE — MAU Provider Note (Signed)
Patient Christy Jackson is a 31 y.o. G2P0010 At 5834w5d here with complaints of abdominal pain and light pink spotting. She has been followed for pregnancy of unknown anatomic location and was scheduled to return to MAU on 11-17 at noon for repeat bhcg.  Beta on 11-15 was 1153; no SIUP or YS visualized.  Patient denies abnormal discharge, difficulty urinating, fever or other symptoms.   History     CSN: [id number]  Arrival date and time: 12/31/16 [id number]   First Provider Initiated Contact with Patient 12/31/16 0124      Chief Complaint  Patient presents with  . Vaginal Bleeding  . Abdominal Pain   Vaginal Bleeding  The patient's primary symptoms include vaginal discharge. The patient's pertinent negatives include no genital lesions or genital odor. This is a new problem. The current episode started today. The problem occurs constantly. The problem has been unchanged. The pain is moderate. She is pregnant. Associated symptoms include abdominal pain. The vaginal discharge was bloody. The vaginal bleeding is spotting. She has not been passing clots. She has not been passing tissue. Nothing aggravates the symptoms. She has tried nothing for the symptoms.  Abdominal Pain  This is a new problem. The current episode started today. The onset quality is sudden. The problem occurs constantly. The pain is located in the RLQ. The pain is at a severity of 10/10. The quality of the pain is cramping. The abdominal pain does not radiate. Nothing aggravates the pain. The pain is relieved by nothing.     OB History    Gravida Para Term Preterm AB Living   2       1     SAB TAB Ectopic Multiple Live Births   1              Past Medical History:  Diagnosis Date  . Medical history non-contributory     Past Surgical History:  Procedure Laterality Date  . NO PAST SURGERIES      History reviewed. No pertinent family history.  Social History   Tobacco Use  . Smoking status: Never Smoker  . Smokeless  tobacco: Never Used  Substance Use Topics  . Alcohol use: No  . Drug use: No    Allergies: No Known Allergies  Medications Prior to Admission  Medication Sig Dispense Refill Last Dose  . butalbital-acetaminophen-caffeine (FIORICET, ESGIC) 50-325-40 MG tablet Take 1 tablet every 6 (six) hours as needed by mouth for headache. 20 tablet 0   . folic acid (FOLVITE) 400 MCG tablet Take 400 mcg by mouth daily.   More than a month at Unknown time  . ibuprofen (ADVIL,MOTRIN) 200 MG tablet Take 200 mg every 6 (six) hours as needed by mouth.   12/23/2016 at Unknown time    Review of Systems  Constitutional: Negative.   HENT: Negative.   Respiratory: Negative.   Gastrointestinal: Positive for abdominal pain.  Genitourinary: Positive for vaginal bleeding and vaginal discharge.  Musculoskeletal: Negative.   Neurological: Negative.   Psychiatric/Behavioral: Negative.    Physical Exam   Blood pressure 122/88, pulse 79, temperature 98 F (36.7 C), resp. rate 18, last menstrual period 11/28/2016, unknown if currently breastfeeding.  Physical Exam  Constitutional: She is oriented to person, place, and time. She appears well-developed.  HENT:  Head: Normocephalic.  Neck: Normal range of motion.  Respiratory: Effort normal.  GI: Soft. She exhibits no mass. There is tenderness. There is no rebound and no guarding.  Genitourinary:  Genitourinary Comments: NEFG: trace  amount of pink blood in the vagina, cervix is closed, RLQ tender with deep palpation. No CMT or suprapubic tenderness.   Musculoskeletal: Normal range of motion.  Neurological: She is alert and oriented to person, place, and time.  Skin: Skin is warm and dry.  Psychiatric: She has a normal mood and affect.    MAU Course  Procedures  MDM -US shows right ectopic with YS, no fetal pole. MSD is 4.4 mm, corresponding to 5 weeks and 1 day. No signs of rupture -beta today 2000, up from 1153.   -CBC stable, LFT and renal normal  -O  pos blood type  Patient is appropriate candidate for methotrexate treatment. Discussed importance of close follow-up and warning signs with patient; patient and husband verbalized understanding.  Methotrexate Treatment Protocol for Ectopic Pregnancy  Pretreatment testing and instructions  hCG concentration  Transvaginal ultrasound  Blood group and Rh(D) typing; give Rhogam 300 mcg IM, if indicated  Complete blood count  Liver and renal function tests  Discontinue folic acid supplements  Counsel patient to avoid NSAIDs, recommend acetaminophen if an analgesic is needed  Advise patient to refrain from sexual intercourse and strenuous exercise  Treatment day  Single dose protocol   1  hCG.  Administer Methotrexate 50 mg/m2 body surface area IM  4  hCG  7  hCG  If <15 percent hCG decline from day 4 to 7, give additional dose of methotrexate 50 mg/m2 IM  If ?15 percent hCG decline from day 4 to 7, draw hCG weekly until undetectable  14  hCG  If <15 percent hCG decline from day 7 to 14, give additional dose of methotrexate 50 mg/m2 IM  If ?15 percent hCG decline from day 7 to 14, check hCG weekly until undetectable  21 and 28  If 3 doses have been given and there is a <15 percent hCG decline from day 21 to 28, proceed with laparoscopic surgery  Laparoscopy  If severe abdominal pain or an acute abdomen suggestive of tubal rupture occurs If ultrasonography reveals greater than 300 mL pelvic or other intraperitoneal fluid  The hCG concentration usually declines to less than 15 mIU/mL by 35 days postinjection but may take as long as 109 days. If the hCG does not decline to zero, a new pregnancy should be excluded; if the hCG is rising, a transvaginal ultrasound should be performed. Alternatively, some patients have a slow clearance of serum hCG. If three weekly values are similar, consider an additional dose of MTX (50 mg/m2) not to exceed the recommended maximum of three total doses. This typically  accelerates the decline of serum hCG. The risk of gestational trophoblastic disease is low. Folinic acid rescue is not required for women treated with the single-dose protocol, even if multiple doses are ultimately given.   Prepared with data from:   [facility]. Clinical practice. Ectopic pregnancy. Malva Limes Engl J Med 2009; 361:379  American College of Obstetricians and Gynecologists. ACOG Practice Bulletin No. 94: Medical management of ectopic pregnancy. Obstet Gynecol 2008; 161:[id number].     Assessment and Plan   1. Right ovarian pregnancy without intrauterine pregnancy   2. Pregnancy of unknown anatomic location   3. Abdominal pain in pregnancy, first trimester    2. Patient counseled on MTX treatment and need for close  Follow-up. Patient received methotrexate injection and tolerated it well.  3. Patient to return to MAU on Tuesday, November 20 for repeat bchg and Saturday, Nov 24 for repeat bhcg.  4. Patient  knows that she may need repeat shot or surgery if methotrexate treatment fails.  5. Strict ectopic precautions given 6. RX for Zofran and percocet.   Christy Jackson 12/31/2016, 3:08 AM

## 2017-01-03 ENCOUNTER — Ambulatory Visit: Payer: Medicaid Other | Admitting: General Practice

## 2017-01-03 ENCOUNTER — Inpatient Hospital Stay (HOSPITAL_COMMUNITY)
Admission: AD | Admit: 2017-01-03 | Discharge: 2017-01-03 | Disposition: A | Discharge status: Other Acute Inpatient Hospital | Payer: Medicaid Other | Source: Ambulatory Visit | Attending: Obstetrics and Gynecology | Admitting: Obstetrics and Gynecology

## 2017-01-03 DIAGNOSIS — O00109 Unspecified tubal pregnancy without intrauterine pregnancy: Secondary | ICD-10-CM

## 2017-01-03 DIAGNOSIS — O3680X0 Pregnancy with inconclusive fetal viability, not applicable or unspecified: Secondary | ICD-10-CM

## 2017-01-03 LAB — HCG, QUANTITATIVE, PREGNANCY: hCG, Beta Chain, Quant, S: 4407 m[IU]/mL — ABNORMAL HIGH (ref <5)

## 2017-01-03 NOTE — Progress Notes (Signed)
Patient registered to MAU in error. She is here for FU HCG. She was walked down to the clinic by the NT here. Unable to remove patient from board without a discharge order. Discharge order placed.   Thressa ShellerHeather Delphin Funes  9:27 AM 01/03/17

## 2017-01-03 NOTE — Progress Notes (Signed)
Patient seen and assessed by nursing staff.  Agree with documentation and plan.  

## 2017-01-03 NOTE — Progress Notes (Signed)
Patient here for stat bhcg today for day #4 labs following MTX for ectopic. Patient denies pain and states bleeding stopped yesterday. Discussed with patient having her wait in lobby for results & updated plan of care. Patient verbalized understanding & had no questions at this time.  Reviewed results with Dr Shawnie PonsPratt who states bhcg levels have risen and patient should follow up as planned on Saturday in MAU. Patient should return to MAU prior to then if she develops severe pain, heavy bleeding, or shortness of breath/difficulty breathing. Informed patient if results and recommendations. Patient verbalized understanding to all & had no questions

## 2017-01-03 NOTE — MAU Note (Signed)
Pt was told to present to MAU for repeat labs, Rn instructed registration to register pt due to provider's note. MAU provider today states to have pt go to ground floor clinic. Pt walked to clinic per NT

## 2017-01-07 ENCOUNTER — Inpatient Hospital Stay (HOSPITAL_COMMUNITY)
Admission: AD | Admit: 2017-01-07 | Discharge: 2017-01-07 | Disposition: A | Discharge status: Home / Self Care | Payer: Medicaid Other | Source: Ambulatory Visit | Attending: Obstetrics and Gynecology | Admitting: Obstetrics and Gynecology

## 2017-01-07 DIAGNOSIS — O00101 Right tubal pregnancy without intrauterine pregnancy: Secondary | ICD-10-CM | POA: Diagnosis not present

## 2017-01-07 DIAGNOSIS — O3680X0 Pregnancy with inconclusive fetal viability, not applicable or unspecified: Secondary | ICD-10-CM

## 2017-01-07 DIAGNOSIS — O00109 Unspecified tubal pregnancy without intrauterine pregnancy: Secondary | ICD-10-CM | POA: Diagnosis present

## 2017-01-07 LAB — CREATININE, SERUM: Creatinine, Ser: 0.6 mg/dL (ref 0.44–1.00)

## 2017-01-07 LAB — CBC WITH DIFFERENTIAL/PLATELET
Basophils Absolute: 0 10*3/uL (ref 0.0–0.1)
Basophils Relative: 0 %
EOS ABS: 0.1 10*3/uL (ref 0.0–0.7)
Eosinophils Relative: 2 %
HEMATOCRIT: 38.6 % (ref 36.0–46.0)
HEMOGLOBIN: 12.6 g/dL (ref 12.0–15.0)
LYMPHS ABS: 2.9 10*3/uL (ref 0.7–4.0)
LYMPHS PCT: 43 %
MCH: 29 pg (ref 26.0–34.0)
MCHC: 32.6 g/dL (ref 30.0–36.0)
MCV: 88.7 fL (ref 78.0–100.0)
MONOS PCT: 6 %
Monocytes Absolute: 0.4 10*3/uL (ref 0.1–1.0)
NEUTROS ABS: 3.4 10*3/uL (ref 1.7–7.7)
NEUTROS PCT: 49 %
Platelets: 247 10*3/uL (ref 150–400)
RBC: 4.35 MIL/uL (ref 3.87–5.11)
RDW: 13.1 % (ref 11.5–15.5)
WBC: 6.8 10*3/uL (ref 4.0–10.5)

## 2017-01-07 LAB — HCG, QUANTITATIVE, PREGNANCY: hCG, Beta Chain, Quant, S: 3481 m[IU]/mL — ABNORMAL HIGH (ref <5)

## 2017-01-07 LAB — BUN: BUN: 6 mg/dL (ref 6–20)

## 2017-01-07 LAB — AST: AST: 20 U/L (ref 15–41)

## 2017-01-07 NOTE — MAU Note (Signed)
Patient here for follow up Day 7 following methotrexate. Denies any pain or bleeding at this time.

## 2017-01-07 NOTE — Discharge Instructions (Signed)
Pelvic Rest Pelvic rest may be recommended if:  Your placenta is partially or completely covering the opening of your cervix (placenta previa).  There is bleeding between the wall of the uterus and the amniotic sac in the first trimester of pregnancy (subchorionic hemorrhage).  You went into labor too early (preterm labor).  Based on your overall health and the health of your baby, your health care provider will decide if pelvic rest is right for you. How do I rest my pelvis? For as long as told by your health care provider:  Do not have sex, sexual stimulation, or an orgasm.  Do not use tampons. Do not douche. Do not put anything in your vagina.  Do not lift anything that is heavier than 10 lb (4.5 kg).  Avoid activities that take a lot of effort (are strenuous).  Avoid any activity in which your pelvic muscles could become strained.  When should I seek medical care? Seek medical care if you have:  Cramping pain in your lower abdomen.  Vaginal discharge.  A low, dull backache.  Regular contractions.  Uterine tightening.  When should I seek immediate medical care? Seek immediate medical care if:  You have vaginal bleeding and you are pregnant.  This information is not intended to replace advice given to you by your health care provider. Make sure you discuss any questions you have with your health care provider. Document Released: 05/28/2010 Document Revised: 07/09/2015 Document Reviewed: 08/04/2014 Elsevier Interactive Patient Education  2018 Elsevier Inc.     Methotrexate Treatment for an Ectopic Pregnancy, Care After Refer to this sheet in the next few weeks. These instructions provide you with information on caring for yourself after your procedure. Your health care provider may also give you more specific instructions. Your treatment has been planned according to current medical practices, but problems sometimes occur. Call your health care provider if you  have any problems or questions after your procedure. What can I expect after the procedure? You may have some abdominal cramping, vaginal bleeding, and fatigue in the first few days after taking methotrexate. Some other possible side effects of methotrexate include:  Nausea.  Vomiting.  Diarrhea.  Mouth sores.  Swelling or irritation of the lining of your lungs (pneumonitis).  Liver damage.  Hair loss.  Follow these instructions at home: After you have received the methotrexate medicine, you need to be careful of your activities and watch your condition for several weeks. It may take 1 week before your hormone levels return to normal. Activity  Do not have sexual intercourse until your health care provider says it is safe to do so.  You may resume your usual diet.  Limit strenuous activity.  Do not drink alcohol. General instructions  Do not take aspirin, ibuprofen, or naproxen (nonsteroidal anti-inflammatory drugs [NSAIDs]).  Do not take folic acid, prenatal vitamins, or other vitamins that contain folic acid.  Avoid traveling too far away from your health care provider.  Keep all follow-up visits as told by your health care provider. This is important. Contact a health care provider if:  You cannot control your nausea and vomiting.  You cannot control your diarrhea.  You have sores in your mouth and want treatment.  You need pain medicine for your abdominal pain.  You have a rash.  You are having a reaction to the medicine. Get help right away if:  You have increasing abdominal or pelvic pain.  You notice increased bleeding.  You feel light-headed, or  you faint.  You have shortness of breath.  Your heart rate increases.  You have a cough.  You have chills.  You have a fever. This information is not intended to replace advice given to you by your health care provider. Make sure you discuss any questions you have with your health care  provider. Document Released: 01/20/2011 Document Revised: 07/09/2015 Document Reviewed: 11/19/2012 Elsevier Interactive Patient Education  2017 [employer]

## 2017-01-07 NOTE — MAU Provider Note (Signed)
[name]  is a 31 y.o. G2P0010  at 5268w5d who presents to MAU today for follow-up quant hCG. She is day 7 s/p MTX for ectopic pregnancy. The patient denies abdominal pain, vaginal bleeding, N/V or fever.   BP 116/78 (BP Location: Right Arm)   Pulse 75   Temp 98.3 F (36.8 C) (Oral)   Resp 18   Ht 5\' 5"  (1.651 m)   Wt 205 lb (93 kg)   LMP 11/28/2016   SpO2 97%   BMI 34.11 kg/m   GENERAL: Well-developed, well-nourished female in no acute distress.  HEENT: Normocephalic, atraumatic.   LUNGS: Effort normal HEART: Regular rate  SKIN: Warm, dry and without erythema PSYCH: Normal mood and affect  Component     Latest Ref Rng & Units 12/31/2016 01/03/2017 01/07/2017  HCG, Beta Chain, Quant, S     <5 mIU/mL 2,001 (H) MTX given 4,407 (H) Day 4 3,481 (H) Day 7    A: Appropriate drop in HCG from day 4 to day 7 1. Right tubal pregnancy without intrauterine pregnancy   2. Pregnancy of unknown anatomic location      P: Discharge home Msg to [facility] for weekly HCGs No intercourse until HCG negative Patient may return to MAU as needed or if her condition were to change or worsen   Judeth HornLawrence, Raegyn Renda, NP  01/07/2017 6:48 PM

## 2017-01-08 ENCOUNTER — Inpatient Hospital Stay (HOSPITAL_COMMUNITY)
Admission: AD | Admit: 2017-01-08 | Discharge: 2017-01-09 | Disposition: A | Discharge status: Home / Self Care | Payer: Medicaid Other | Source: Ambulatory Visit | Attending: Obstetrics and Gynecology | Admitting: Obstetrics and Gynecology

## 2017-01-08 ENCOUNTER — Encounter (HOSPITAL_COMMUNITY): Payer: Self-pay

## 2017-01-08 DIAGNOSIS — O00201 Right ovarian pregnancy without intrauterine pregnancy: Secondary | ICD-10-CM | POA: Diagnosis not present

## 2017-01-08 DIAGNOSIS — O00101 Right tubal pregnancy without intrauterine pregnancy: Secondary | ICD-10-CM

## 2017-01-08 DIAGNOSIS — O3680X0 Pregnancy with inconclusive fetal viability, not applicable or unspecified: Secondary | ICD-10-CM

## 2017-01-08 DIAGNOSIS — O209 Hemorrhage in early pregnancy, unspecified: Secondary | ICD-10-CM | POA: Diagnosis present

## 2017-01-08 DIAGNOSIS — O009 Unspecified ectopic pregnancy without intrauterine pregnancy: Secondary | ICD-10-CM

## 2017-01-08 LAB — CBC
HEMATOCRIT: 35.8 % — AB (ref 36.0–46.0)
Hemoglobin: 11.9 g/dL — ABNORMAL LOW (ref 12.0–15.0)
MCH: 29.2 pg (ref 26.0–34.0)
MCHC: 33.2 g/dL (ref 30.0–36.0)
MCV: 87.7 fL (ref 78.0–100.0)
PLATELETS: 261 10*3/uL (ref 150–400)
RBC: 4.08 MIL/uL (ref 3.87–5.11)
RDW: 13 % (ref 11.5–15.5)
WBC: 6.7 10*3/uL (ref 4.0–10.5)

## 2017-01-08 MED ORDER — LACTATED RINGERS IV SOLN
INTRAVENOUS | Status: DC
  Administered 2017-01-08: 23:00:00 via INTRAVENOUS

## 2017-01-08 MED ORDER — PROMETHAZINE HCL 25 MG/ML IJ SOLN
25.0000 mg | Freq: Once | INTRAMUSCULAR | Status: AC
  Administered 2017-01-08: 25 mg via INTRAVENOUS
  Filled 2017-01-08: qty 1

## 2017-01-08 MED ORDER — HYDROMORPHONE HCL 1 MG/ML IJ SOLN
1.0000 mg | INTRAMUSCULAR | Status: DC | PRN
  Administered 2017-01-08: 1 mg via INTRAVENOUS
  Filled 2017-01-08: qty 1

## 2017-01-08 NOTE — MAU Provider Note (Signed)
History     CSN: [id number]  Arrival date and time: 01/08/17 2208   First Provider Initiated Contact with Patient 01/08/17 2304      Chief Complaint  Patient presents with  . Vaginal Bleeding  . Abdominal Pain   G2P0010 with known right ectopic here with VB and LLQ pain. She is s/p MTX 8 days ago. Pain started around 6pm and worsened by 8pm. She took 1 Percocet but it did not help. Bleeding started around same time as pain. Describes as bright red like a period, required a pad but did not saturate it.    OB History    Gravida Para Term Preterm AB Living   2       1     SAB TAB Ectopic Multiple Live Births   1              Past Medical History:  Diagnosis Date  . Medical history non-contributory     Past Surgical History:  Procedure Laterality Date  . NO PAST SURGERIES      History reviewed. No pertinent family history.  Social History   Tobacco Use  . Smoking status: Never Smoker  . Smokeless tobacco: Never Used  Substance Use Topics  . Alcohol use: No  . Drug use: No    Allergies: No Known Allergies  Medications Prior to Admission  Medication Sig Dispense Refill Last Dose  . ondansetron (ZOFRAN) 4 MG tablet Take 1 tablet (4 mg total) every 8 (eight) hours as needed by mouth for nausea or vomiting. 20 tablet 0 01/08/2017 at Unknown time  . oxyCODONE-acetaminophen (PERCOCET/ROXICET) 5-325 MG tablet Take 1 tablet every 6 (six) hours as needed by mouth for moderate pain. 20 tablet 0 01/08/2017 at Unknown time    Review of Systems  Gastrointestinal: Positive for abdominal pain.  Genitourinary: Positive for vaginal bleeding.   Physical Exam   Blood pressure (!) 135/92, pulse 77, temperature 97.7 F (36.5 C), resp. rate 20, last menstrual period 11/28/2016, SpO2 100 %, unknown if currently breastfeeding.  Physical Exam  Constitutional: She is oriented to person, place, and time. She appears well-developed and well-nourished. She appears distressed (appears  uncomfortable).  HENT:  Head: Normocephalic and atraumatic.  Neck: Normal range of motion.  Respiratory: Effort normal. No respiratory distress.  GI: Soft. She exhibits no distension and no mass. There is no tenderness. There is no rebound and no guarding.  Genitourinary:  Genitourinary Comments: External: no lesions or erythema Vagina: rugated, pink, moist, small drk red discharge, no active bleeding, cervix closed Uterus: non enlarged, anteverted, non tender, no CMT Adnexae: no masses, no tenderness left, no tenderness right   Musculoskeletal: Normal range of motion.  Neurological: She is alert and oriented to person, place, and time.  Skin: Skin is warm and dry.  Psychiatric: She has a normal mood and affect.   Results for orders placed or performed during the hospital encounter of 01/08/17 (from the past 24 hour(s))  CBC     Status: Abnormal   Collection Time: 01/08/17 11:24 PM  Result Value Ref Range   WBC 6.7 4.0 - 10.5 K/uL   RBC 4.08 3.87 - 5.11 MIL/uL   Hemoglobin 11.9 (L) 12.0 - 15.0 g/dL   HCT 57.835.8 (L) 46.936.0 - 62.946.0 %   MCV 87.7 78.0 - 100.0 fL   MCH 29.2 26.0 - 34.0 pg   MCHC 33.2 30.0 - 36.0 g/dL   RDW 52.813.0 41.311.5 - 24.415.5 %   Platelets 261  150 - 400 K/uL  hCG, quantitative, pregnancy     Status: Abnormal   Collection Time: 01/08/17 11:24 PM  Result Value Ref Range   hCG, Beta Chain, Quant, S 3,243 (H) <5 mIU/mL   [place] Ob Transvaginal  Result Date: 01/09/2017 CLINICAL DATA:  Left lower quadrant pain, spotting, and cramping. Estimated gestational age by LMP is 5 weeks 6 days. Patient is status post methotrexate for right ectopic pregnancy seen on prior ultrasound 12/31/2016. Quantitative beta HCG was [birth year]1979 on 01/07/2017. EXAM: TRANSVAGINAL OB ULTRASOUND TECHNIQUE: Transvaginal ultrasound was performed for complete evaluation of the gestation as well as the maternal uterus, adnexal regions, and pelvic cul-de-sac. COMPARISON:  12/31/2016 FINDINGS: Intrauterine gestational  sac: None Yolk sac:  Not Visualized. Embryo:  Not Visualized. Cardiac Activity: Not Visualized. Maternal uterus/adnexae: Uterus appears mildly anteverted. No myometrial mass lesions. Endometrial stripe thickness is normal and no endometrial fluid is identified. The left ovary is unremarkable. Adjacent to the right ovary, there is a gestational sac with a yolk sac apparent. This is consistent with right adnexal ectopic pregnancy. Color flow Doppler images of the area were not obtained today. The ectopic pregnancy measures about 1.5 x 1.7 x 2.1 cm. This is about the same size as previously, but the structure appears somewhat more irregular than previously. No fetal pole is definitively identified. Small amount of free fluid in the pelvis. IMPRESSION: A right adnexal ectopic pregnancy is again demonstrated. The ectopic gestational sac and yolk sac are visualized. The ectopic pregnancy measures about the same as previous study but is somewhat more irregular. Uterus and left ovary are unremarkable. Electronically Signed   By: Burman NievesWilliam  Stevens M.D.   On: 01/09/2017 00:18   MAU Course  Procedures LR Dilaudid Phenergan  MDM Labs and US ordered and reviewed. No evidence of ruptured ectopic. Pain resolved after meds. Consult with Dr. Jolayne Pantheronstant, discussed presentation, physical findings, labs, and US >plan for weekly follow up. Stable for discharge home.  Assessment and Plan   1. Right tubal pregnancy without intrauterine pregnancy   2. Pregnancy of unknown anatomic location   3. Ectopic pregnancy    Discharge home Follow up in [facility] in 1 week for labs Strict return precautions Percocet prn (has Rx) Zofran prn (has Rx)  Allergies as of 01/09/2017   No Known Allergies     Medication List    TAKE these medications   ondansetron 4 MG tablet Commonly known as:  ZOFRAN Take 1 tablet (4 mg total) every 8 (eight) hours as needed by mouth for nausea or vomiting.   oxyCODONE-acetaminophen 5-325 MG  tablet Commonly known as:  PERCOCET/ROXICET Take 1 tablet every 6 (six) hours as needed by mouth for moderate pain.      Donette LarryMelanie Adit Riddles, CNM 01/09/2017, 12:58 AM

## 2017-01-08 NOTE — MAU Note (Signed)
Pt reports some vaginal spotting and cramping that started around 1830. States she took the pain killer that she was prescribed around 2020-has not helped. Pain has progressively gotten worse. Pt reports she is being followed for ectopic-had methotrexate last Saturday.

## 2017-01-08 NOTE — MAU Note (Signed)
Pain in the abdomen starting 1800. The pain has gotten worse since then and then she took nausea and pain medicine. Patient vomited prior to coming to MAU.

## 2017-01-09 ENCOUNTER — Inpatient Hospital Stay (HOSPITAL_COMMUNITY): Payer: Medicaid Other

## 2017-01-09 LAB — HCG, QUANTITATIVE, PREGNANCY: HCG, BETA CHAIN, QUANT, S: 3243 m[IU]/mL — AB (ref <5)

## 2017-01-09 NOTE — Discharge Instructions (Signed)
Ectopic Pregnancy °An ectopic pregnancy happens when a fertilized egg grows outside the uterus. A pregnancy cannot live outside of the uterus. This problem often happens in the fallopian tube. It is often caused by damage to the fallopian tube. °If this problem is found early, you may be treated with medicine. If your tube tears or bursts open (ruptures), you will bleed inside. This is an emergency. You will need surgery. Get help right away. °What are the signs or symptoms? °You may have normal pregnancy symptoms at first. These include: °· Missing your period. °· Feeling sick to your stomach (nauseous). °· Being tired. °· Having tender breasts. ° °Then, you may start to have symptoms that are not normal. These include: °· Pain with sex (intercourse). °· Bleeding from the vagina. This includes light bleeding (spotting). °· Belly (abdomen) or lower belly cramping or pain. This may be felt on one side. °· A fast heartbeat (pulse). °· Passing out (fainting) after going poop (bowel movement). ° °If your tube tears, you may have symptoms such as: °· Really bad pain in the belly or lower belly. This happens suddenly. °· Dizziness. °· Passing out. °· Shoulder pain. ° °Get help right away if: °You have any of these symptoms. This is an emergency. °This information is not intended to replace advice given to you by your health care provider. Make sure you discuss any questions you have with your health care provider. °Document Released: 04/29/2008 Document Revised: 07/09/2015 Document Reviewed: 09/12/2012 °Elsevier Interactive Patient Education © 2017 Elsevier Inc. ° °

## 2017-01-16 ENCOUNTER — Inpatient Hospital Stay (HOSPITAL_COMMUNITY): Payer: Medicaid Other | Admitting: Anesthesiology

## 2017-01-16 ENCOUNTER — Encounter (HOSPITAL_COMMUNITY): Payer: Self-pay | Admitting: *Deleted

## 2017-01-16 ENCOUNTER — Ambulatory Visit (HOSPITAL_COMMUNITY)
Admission: AD | Admit: 2017-01-16 | Discharge: 2017-01-16 | Disposition: A | Discharge status: Home / Self Care | Payer: Medicaid Other | Source: Ambulatory Visit | Attending: Obstetrics & Gynecology | Admitting: Obstetrics & Gynecology

## 2017-01-16 ENCOUNTER — Other Ambulatory Visit: Payer: Medicaid Other

## 2017-01-16 ENCOUNTER — Inpatient Hospital Stay (HOSPITAL_COMMUNITY): Payer: Medicaid Other

## 2017-01-16 ENCOUNTER — Encounter (HOSPITAL_COMMUNITY)
Admission: AD | Disposition: A | Discharge status: Home / Self Care | Payer: Self-pay | Source: Ambulatory Visit | Attending: Obstetrics & Gynecology

## 2017-01-16 ENCOUNTER — Other Ambulatory Visit: Payer: Self-pay

## 2017-01-16 DIAGNOSIS — E669 Obesity, unspecified: Secondary | ICD-10-CM | POA: Insufficient documentation

## 2017-01-16 DIAGNOSIS — Z6833 Body mass index (BMI) 33.0-33.9, adult: Secondary | ICD-10-CM | POA: Insufficient documentation

## 2017-01-16 DIAGNOSIS — K661 Hemoperitoneum: Secondary | ICD-10-CM | POA: Diagnosis present

## 2017-01-16 DIAGNOSIS — D649 Anemia, unspecified: Secondary | ICD-10-CM | POA: Insufficient documentation

## 2017-01-16 DIAGNOSIS — O26892 Other specified pregnancy related conditions, second trimester: Secondary | ICD-10-CM | POA: Diagnosis not present

## 2017-01-16 DIAGNOSIS — O26891 Other specified pregnancy related conditions, first trimester: Secondary | ICD-10-CM

## 2017-01-16 DIAGNOSIS — O00101 Right tubal pregnancy without intrauterine pregnancy: Secondary | ICD-10-CM | POA: Insufficient documentation

## 2017-01-16 DIAGNOSIS — R109 Unspecified abdominal pain: Secondary | ICD-10-CM

## 2017-01-16 DIAGNOSIS — Z3A14 14 weeks gestation of pregnancy: Secondary | ICD-10-CM | POA: Diagnosis not present

## 2017-01-16 HISTORY — PX: LAPAROSCOPY: SHX197

## 2017-01-16 LAB — CBC
HCT: 34.3 % — ABNORMAL LOW (ref 36.0–46.0)
HEMOGLOBIN: 11.6 g/dL — AB (ref 12.0–15.0)
MCH: 29.1 pg (ref 26.0–34.0)
MCHC: 33.8 g/dL (ref 30.0–36.0)
MCV: 86.2 fL (ref 78.0–100.0)
Platelets: 249 10*3/uL (ref 150–400)
RBC: 3.98 MIL/uL (ref 3.87–5.11)
RDW: 12.6 % (ref 11.5–15.5)
WBC: 8.5 10*3/uL (ref 4.0–10.5)

## 2017-01-16 LAB — HCG, QUANTITATIVE, PREGNANCY: hCG, Beta Chain, Quant, S: 822 m[IU]/mL — ABNORMAL HIGH (ref <5)

## 2017-01-16 SURGERY — LAPAROSCOPY OPERATIVE
Anesthesia: General | Site: Abdomen

## 2017-01-16 MED ORDER — OXYCODONE-ACETAMINOPHEN 5-325 MG PO TABS
ORAL_TABLET | ORAL | Status: AC
  Filled 2017-01-16: qty 1

## 2017-01-16 MED ORDER — FENTANYL CITRATE (PF) 100 MCG/2ML IJ SOLN
INTRAMUSCULAR | Status: AC
  Filled 2017-01-16: qty 2

## 2017-01-16 MED ORDER — SODIUM CHLORIDE 0.9 % IR SOLN
Status: DC | PRN
  Administered 2017-01-16: 3000 mL

## 2017-01-16 MED ORDER — OXYCODONE-ACETAMINOPHEN 5-325 MG PO TABS
1.0000 | ORAL_TABLET | ORAL | Status: DC | PRN
  Administered 2017-01-16: 1 via ORAL

## 2017-01-16 MED ORDER — LACTATED RINGERS IV BOLUS (SEPSIS)
1000.0000 mL | Freq: Once | INTRAVENOUS | Status: AC
  Administered 2017-01-16: 1000 mL via INTRAVENOUS

## 2017-01-16 MED ORDER — OXYCODONE-ACETAMINOPHEN 5-325 MG PO TABS: 1.0000 | ORAL_TABLET | Freq: Four times a day (QID) | ORAL | 0 refills | Status: DC | PRN

## 2017-01-16 MED ORDER — BUPIVACAINE HCL (PF) 0.5 % IJ SOLN
INTRAMUSCULAR | Status: DC | PRN
  Administered 2017-01-16: 30 mL

## 2017-01-16 MED ORDER — ONDANSETRON HCL 4 MG PO TABS: 4.0000 mg | ORAL_TABLET | Freq: Three times a day (TID) | ORAL | 1 refills | Status: DC | PRN

## 2017-01-16 MED ORDER — HYDROMORPHONE HCL 1 MG/ML IJ SOLN: 1.0000 mg | Freq: Once | INTRAMUSCULAR | Status: DC

## 2017-01-16 MED ORDER — ROCURONIUM BROMIDE 100 MG/10ML IV SOLN
INTRAVENOUS | Status: DC | PRN
  Administered 2017-01-16: 30 mg via INTRAVENOUS

## 2017-01-16 MED ORDER — FAMOTIDINE IN NACL 20-0.9 MG/50ML-% IV SOLN
INTRAVENOUS | Status: AC
  Filled 2017-01-16: qty 50

## 2017-01-16 MED ORDER — DEXAMETHASONE SODIUM PHOSPHATE 10 MG/ML IJ SOLN
INTRAMUSCULAR | Status: DC | PRN
  Administered 2017-01-16: 10 mg via INTRAVENOUS

## 2017-01-16 MED ORDER — FENTANYL CITRATE (PF) 100 MCG/2ML IJ SOLN
INTRAMUSCULAR | Status: DC | PRN
  Administered 2017-01-16: 50 ug via INTRAVENOUS
  Administered 2017-01-16 (×2): 100 ug via INTRAVENOUS
  Administered 2017-01-16: 50 ug via INTRAVENOUS
  Administered 2017-01-16: 100 ug via INTRAVENOUS
  Administered 2017-01-16: 50 ug via INTRAVENOUS

## 2017-01-16 MED ORDER — SUCCINYLCHOLINE CHLORIDE 20 MG/ML IJ SOLN
INTRAMUSCULAR | Status: DC | PRN
  Administered 2017-01-16: 100 mg via INTRAVENOUS

## 2017-01-16 MED ORDER — DOCUSATE SODIUM 100 MG PO CAPS: 100.0000 mg | ORAL_CAPSULE | Freq: Two times a day (BID) | ORAL | 2 refills | Status: DC | PRN

## 2017-01-16 MED ORDER — IBUPROFEN 600 MG PO TABS: 600.0000 mg | ORAL_TABLET | Freq: Four times a day (QID) | ORAL | 2 refills | Status: DC | PRN

## 2017-01-16 MED ORDER — SOD CITRATE-CITRIC ACID 500-334 MG/5ML PO SOLN
ORAL | Status: AC
  Filled 2017-01-16: qty 15

## 2017-01-16 MED ORDER — SOD CITRATE-CITRIC ACID 500-334 MG/5ML PO SOLN
30.0000 mL | Freq: Once | ORAL | Status: AC
  Administered 2017-01-16: 30 mL via ORAL

## 2017-01-16 MED ORDER — KETOROLAC TROMETHAMINE 30 MG/ML IJ SOLN
INTRAMUSCULAR | Status: DC | PRN
  Administered 2017-01-16: 30 mg via INTRAVENOUS

## 2017-01-16 MED ORDER — PROPOFOL 10 MG/ML IV BOLUS
INTRAVENOUS | Status: DC | PRN
  Administered 2017-01-16: 200 mg via INTRAVENOUS

## 2017-01-16 MED ORDER — LACTATED RINGERS IV SOLN
INTRAVENOUS | Status: DC
  Administered 2017-01-16 (×2): via INTRAVENOUS

## 2017-01-16 MED ORDER — FENTANYL CITRATE (PF) 100 MCG/2ML IJ SOLN
25.0000 ug | INTRAMUSCULAR | Status: DC | PRN
Start: 2017-01-16 — End: 2017-01-16
  Administered 2017-01-16: 25 ug via INTRAVENOUS
  Administered 2017-01-16 (×2): 50 ug via INTRAVENOUS

## 2017-01-16 MED ORDER — FAMOTIDINE IN NACL 20-0.9 MG/50ML-% IV SOLN
20.0000 mg | Freq: Once | INTRAVENOUS | Status: AC
  Administered 2017-01-16: 20 mg via INTRAVENOUS

## 2017-01-16 MED ORDER — MIDAZOLAM HCL 2 MG/2ML IJ SOLN
INTRAMUSCULAR | Status: DC | PRN
  Administered 2017-01-16: 2 mg via INTRAVENOUS

## 2017-01-16 MED ORDER — MIDAZOLAM HCL 2 MG/2ML IJ SOLN
INTRAMUSCULAR | Status: AC
Start: 2017-01-16 — End: ?
  Filled 2017-01-16: qty 2

## 2017-01-16 MED ORDER — SUGAMMADEX SODIUM 200 MG/2ML IV SOLN
INTRAVENOUS | Status: AC
  Filled 2017-01-16: qty 2

## 2017-01-16 MED ORDER — FENTANYL CITRATE (PF) 100 MCG/2ML IJ SOLN
50.0000 ug | Freq: Once | INTRAMUSCULAR | Status: AC
  Administered 2017-01-16: 50 ug via INTRAVENOUS

## 2017-01-16 MED ORDER — SUGAMMADEX SODIUM 200 MG/2ML IV SOLN
INTRAVENOUS | Status: DC | PRN
  Administered 2017-01-16: 180.6 mg via INTRAVENOUS

## 2017-01-16 MED ORDER — LIDOCAINE HCL (CARDIAC) 20 MG/ML IV SOLN
INTRAVENOUS | Status: DC | PRN
  Administered 2017-01-16: 100 mg via INTRAVENOUS

## 2017-01-16 MED ORDER — FENTANYL CITRATE (PF) 250 MCG/5ML IJ SOLN
INTRAMUSCULAR | Status: AC
  Filled 2017-01-16: qty 5

## 2017-01-16 MED ORDER — ONDANSETRON HCL 4 MG/2ML IJ SOLN
INTRAMUSCULAR | Status: DC | PRN
  Administered 2017-01-16: 4 mg via INTRAVENOUS

## 2017-01-16 MED ORDER — PROMETHAZINE HCL 25 MG/ML IJ SOLN: 6.2500 mg | INTRAMUSCULAR | Status: DC | PRN

## 2017-01-16 SURGICAL SUPPLY — 31 items
CABLE HIGH FREQUENCY MONO STRZ (ELECTRODE) IMPLANT
CATH ROBINSON RED A/P 16FR (CATHETERS) ×2 IMPLANT
DERMABOND ADVANCED (GAUZE/BANDAGES/DRESSINGS) ×1
DERMABOND ADVANCED .7 DNX12 (GAUZE/BANDAGES/DRESSINGS) ×1 IMPLANT
DRSG OPSITE POSTOP 3X4 (GAUZE/BANDAGES/DRESSINGS) ×2 IMPLANT
DURAPREP 26ML APPLICATOR (WOUND CARE) ×2 IMPLANT
GLOVE BIOGEL PI IND STRL 7.0 (GLOVE) ×3 IMPLANT
GLOVE BIOGEL PI INDICATOR 7.0 (GLOVE) ×3
GLOVE ECLIPSE 7.0 STRL STRAW (GLOVE) ×2 IMPLANT
GOWN STRL REUS W/TWL LRG LVL3 (GOWN DISPOSABLE) ×6 IMPLANT
NEEDLE INSUFFLATION 120MM (ENDOMECHANICALS) IMPLANT
NS IRRIG 1000ML POUR BTL (IV SOLUTION) ×2 IMPLANT
PACK LAPAROSCOPY BASIN (CUSTOM PROCEDURE TRAY) ×2 IMPLANT
PACK TRENDGUARD 450 HYBRID PRO (MISCELLANEOUS) ×1 IMPLANT
PACK TRENDGUARD 600 HYBRD PROC (MISCELLANEOUS) IMPLANT
POUCH SPECIMEN RETRIEVAL 10MM (ENDOMECHANICALS) ×2 IMPLANT
PROTECTOR NERVE ULNAR (MISCELLANEOUS) ×4 IMPLANT
SET IRRIG TUBING LAPAROSCOPIC (IRRIGATION / IRRIGATOR) ×2 IMPLANT
SHEARS HARMONIC ACE PLUS 36CM (ENDOMECHANICALS) ×2 IMPLANT
SLEEVE XCEL OPT CAN 5 100 (ENDOMECHANICALS) ×2 IMPLANT
SUT VICRYL 0 UR6 27IN ABS (SUTURE) ×2 IMPLANT
SUT VICRYL 4-0 PS2 18IN ABS (SUTURE) ×2 IMPLANT
TOWEL OR 17X24 6PK STRL BLUE (TOWEL DISPOSABLE) ×4 IMPLANT
TRAY FOLEY CATH SILVER 14FR (SET/KITS/TRAYS/PACK) IMPLANT
TRENDGUARD 450 HYBRID PRO PACK (MISCELLANEOUS) ×2
TRENDGUARD 600 HYBRID PROC PK (MISCELLANEOUS)
TROCAR 12M 150ML BLUNT (TROCAR) ×2 IMPLANT
TROCAR 5M 150ML BLDLS (TROCAR) ×2 IMPLANT
TROCAR XCEL NON-BLD 11X100MML (ENDOMECHANICALS) ×2 IMPLANT
TROCAR XCEL NON-BLD 5MMX100MML (ENDOMECHANICALS) ×2 IMPLANT
WARMER LAPAROSCOPE (MISCELLANEOUS) ×2 IMPLANT

## 2017-01-16 NOTE — MAU Note (Signed)
Pt was diagnosed with an ectopic pregnancy 2 wks ago and was given methotrexate. She was prescribed percocet for pain and told to return if pain worsened. Pt states pain started tonight at 1900 at which time she took 2 percocet and again at 2300 without relief. Reports she vomited prior to coming to hosp.  Pt reports vag bleeding every day since the diagnosis and says she has used two pads in the past hour. States the pad was not full.

## 2017-01-16 NOTE — H&P (Signed)
Preoperative History and Physical  Christy Jackson is a 31 y.o. G2P0010 here for surgical management of ruptured ectopic pregnancy despite treatment with methotrexate and falling HCG values.  See preceeding MAU note for further details.  No significant preoperative concerns.  Proposed surgery: Operative laparoscopy, possible unilateral salpingectomy.  Past Medical History:  Diagnosis Date  . Medical history non-contributory    Past Surgical History:  Procedure Laterality Date  . NO PAST SURGERIES     OB History  Gravida Para Term Preterm AB Living  2       1    SAB TAB Ectopic Multiple Live Births  1            # Outcome Date GA Lbr Len/2nd Weight Sex Delivery Anes PTL Lv  2 Current           1 SAB             Patient denies any other pertinent gynecologic issues.   No current facility-administered medications on file prior to encounter.    Current Outpatient Medications on File Prior to Encounter  Medication Sig Dispense Refill  . ondansetron (ZOFRAN) 4 MG tablet Take 1 tablet (4 mg total) every 8 (eight) hours as needed by mouth for nausea or vomiting. 20 tablet 0  . oxyCODONE-acetaminophen (PERCOCET/ROXICET) 5-325 MG tablet Take 1 tablet every 6 (six) hours as needed by mouth for moderate pain. 20 tablet 0   No Known Allergies  Social History:   reports that  has never smoked. she has never used smokeless tobacco. She reports that she does not drink alcohol or use drugs.  History reviewed. No pertinent family history.  Review of Systems: Pertinent items noted in HPI and remainder of comprehensive ROS otherwise negative.  PHYSICAL EXAM: Blood pressure 94/80, pulse 84, temperature 97.8 F (36.6 C), temperature source Oral, resp. rate 16, height 5\' 5"  (1.651 m), weight 199 lb (90.3 kg), last menstrual period 11/28/2016, unknown if currently breastfeeding. CONSTITUTIONAL: Well-developed, well-nourished female in no acute distress.  HENT:  Normocephalic, atraumatic, External  right and left ear normal. Oropharynx is clear and moist EYES: Conjunctivae and EOM are normal. Pupils are equal, round, and reactive to light. No scleral icterus.  NECK: Normal range of motion, supple, no masses SKIN: Skin is warm and dry. No rash noted. Not diaphoretic. No erythema. No pallor. NEUROLOGIC: Alert and oriented to person, place, and time. Normal reflexes, muscle tone coordination. No cranial nerve deficit noted. PSYCHIATRIC: Normal mood and affect. Normal behavior. Normal judgment and thought content. CARDIOVASCULAR: Normal heart rate noted, regular rhythm RESPIRATORY: Effort and breath sounds normal, no problems with respiration noted ABDOMEN: Soft, +significant RLQ tenderness, +rebound, +guarding PELVIC: Deferred MUSCULOSKELETAL: Normal range of motion. No edema and no tenderness. 2+ distal pulses.  Labs: Results for orders placed or performed during the hospital encounter of 01/16/17 (from the past 336 hour(s))  hCG, quantitative, pregnancy   Collection Time: 01/16/17  1:27 AM  Result Value Ref Range   hCG, Beta Chain, Quant, S 822 (H) <5 mIU/mL  CBC   Collection Time: 01/16/17  1:27 AM  Result Value Ref Range   WBC 8.5 4.0 - 10.5 K/uL   RBC 3.98 3.87 - 5.11 MIL/uL   Hemoglobin 11.6 (L) 12.0 - 15.0 g/dL   HCT 40.934.3 (L) 81.136.0 - 91.446.0 %   MCV 86.2 78.0 - 100.0 fL   MCH 29.1 26.0 - 34.0 pg   MCHC 33.8 30.0 - 36.0 g/dL   RDW 78.212.6 95.611.5 -  15.5 %   Platelets 249 150 - 400 K/uL  Results for orders placed or performed during the hospital encounter of 01/08/17 (from the past 336 hour(s))  CBC   Collection Time: 01/08/17 11:24 PM  Result Value Ref Range   WBC 6.7 4.0 - 10.5 K/uL   RBC 4.08 3.87 - 5.11 MIL/uL   Hemoglobin 11.9 (L) 12.0 - 15.0 g/dL   HCT 16.1 (L) 09.6 - 04.5 %   MCV 87.7 78.0 - 100.0 fL   MCH 29.2 26.0 - 34.0 pg   MCHC 33.2 30.0 - 36.0 g/dL   RDW 40.9 81.1 - 91.4 %   Platelets 261 150 - 400 K/uL  hCG, quantitative, pregnancy   Collection Time: 01/08/17  11:24 PM  Result Value Ref Range   hCG, Beta Chain, Quant, S 3,243 (H) <5 mIU/mL  Results for orders placed or performed during the hospital encounter of 01/07/17 (from the past 336 hour(s))  hCG, quantitative, pregnancy   Collection Time: 01/07/17  3:37 PM  Result Value Ref Range   hCG, Beta Chain, Quant, S 3,481 (H) <5 mIU/mL  CBC WITH DIFFERENTIAL   Collection Time: 01/07/17  3:37 PM  Result Value Ref Range   WBC 6.8 4.0 - 10.5 K/uL   RBC 4.35 3.87 - 5.11 MIL/uL   Hemoglobin 12.6 12.0 - 15.0 g/dL   HCT 78.2 95.6 - 21.3 %   MCV 88.7 78.0 - 100.0 fL   MCH 29.0 26.0 - 34.0 pg   MCHC 32.6 30.0 - 36.0 g/dL   RDW 08.6 57.8 - 46.9 %   Platelets 247 150 - 400 K/uL   Neutrophils Relative % 49 %   Neutro Abs 3.4 1.7 - 7.7 K/uL   Lymphocytes Relative 43 %   Lymphs Abs 2.9 0.7 - 4.0 K/uL   Monocytes Relative 6 %   Monocytes Absolute 0.4 0.1 - 1.0 K/uL   Eosinophils Relative 2 %   Eosinophils Absolute 0.1 0.0 - 0.7 K/uL   Basophils Relative 0 %   Basophils Absolute 0.0 0.0 - 0.1 K/uL  AST   Collection Time: 01/07/17  3:37 PM  Result Value Ref Range   AST 20 15 - 41 U/L  BUN   Collection Time: 01/07/17  3:37 PM  Result Value Ref Range   BUN 6 6 - 20 mg/dL  Creatinine, serum   Collection Time: 01/07/17  3:37 PM  Result Value Ref Range   Creatinine, Ser 0.60 0.44 - 1.00 mg/dL   GFR calc non Af Amer >60 >60 mL/min   GFR calc Af Amer >60 >60 mL/min  Results for orders placed or performed in visit on 01/03/17 (from the past 336 hour(s))  hCG, quantitative, pregnancy   Collection Time: 01/03/17  9:44 AM  Result Value Ref Range   hCG, Beta Chain, Quant, S 4,407 (H) <5 mIU/mL   Patient is O positive.   Imaging Studies: US Ob Comp Less 14 Wks  Result Date: 12/29/2016 CLINICAL DATA:  Pregnant patient with vaginal spotting. EXAM: OBSTETRIC <14 WK [place] AND TRANSVAGINAL OB US TECHNIQUE: Both transabdominal and transvaginal ultrasound examinations were performed for complete evaluation  of the gestation as well as the maternal uterus, adnexal regions, and pelvic cul-de-sac. Transvaginal technique was performed to assess early pregnancy. COMPARISON:  Pelvic ultrasound 10/15/2016 FINDINGS: Intrauterine gestational sac: None Yolk sac:  Not Visualized. Embryo:  Not Visualized. Cardiac Activity: Not Visualized. Maternal uterus/adnexae: Normal right and left ovaries. Small corpus luteum in the right ovary. No free  fluid in the pelvis. No adnexal mass is identified. IMPRESSION: No intrauterine gestation identified. In the setting of positive pregnancy test and no definite intrauterine pregnancy, this reflects a pregnancy of unknown location. Differential considerations include early normal IUP, abnormal IUP, or nonvisualized ectopic pregnancy. Differentiation is achieved with serial beta HCG supplemented by repeat sonography as clinically warranted. Electronically Signed   By: Annia Beltrew  Davis M.D.   On: 12/29/2016 14:10   [place] Ob Transvaginal  Result Date: 01/16/2017 CLINICAL DATA:  Increasing abdominal pain. Known right adnexal ectopic pregnancy. EXAM: TRANSVAGINAL OB ULTRASOUND TECHNIQUE: Transvaginal ultrasound was performed for complete evaluation of the gestation as well as the maternal uterus, adnexal regions, and pelvic cul-de-sac. COMPARISON:  01/09/2017 FINDINGS: The previously demonstrated ectopic pregnancy with embryo and yolk sac within the right adnexa is not as well demonstrated. There is persistent enlargement of the right adnexa. There has been interval development of moderate amount of complex fluid within the right and left adnexa as well as pelvic cul-de-sac. The uterus has normal appearance. IMPRESSION: Known right adnexal ectopic pregnancy not as well defined as on the prior exams. Moderate amount of hemorrhagic fluid within the pelvis, concerning for ruptured ectopic pregnancy with bleeding into the pelvis. These results were called by telephone at the time of interpretation on  01/16/2017 at 2:31 am to Dr. Sharen CounterLISA LEFTWICH-KIRBY , who verbally acknowledged these results. Electronically Signed   By: Ted Mcalpineobrinka  Dimitrova M.D.   On: 01/16/2017 02:33   [place] Ob Transvaginal  Result Date: 01/09/2017 CLINICAL DATA:  Left lower quadrant pain, spotting, and cramping. Estimated gestational age by LMP is 5 weeks 6 days. Patient is status post methotrexate for right ectopic pregnancy seen on prior ultrasound 12/31/2016. Quantitative beta HCG was 06/14/1979 on 01/07/2017. EXAM: TRANSVAGINAL OB ULTRASOUND TECHNIQUE: Transvaginal ultrasound was performed for complete evaluation of the gestation as well as the maternal uterus, adnexal regions, and pelvic cul-de-sac. COMPARISON:  12/31/2016 FINDINGS: Intrauterine gestational sac: None Yolk sac:  Not Visualized. Embryo:  Not Visualized. Cardiac Activity: Not Visualized. Maternal uterus/adnexae: Uterus appears mildly anteverted. No myometrial mass lesions. Endometrial stripe thickness is normal and no endometrial fluid is identified. The left ovary is unremarkable. Adjacent to the right ovary, there is a gestational sac with a yolk sac apparent. This is consistent with right adnexal ectopic pregnancy. Color flow Doppler images of the area were not obtained today. The ectopic pregnancy measures about 1.5 x 1.7 x 2.1 cm. This is about the same size as previously, but the structure appears somewhat more irregular than previously. No fetal pole is definitively identified. Small amount of free fluid in the pelvis. IMPRESSION: A right adnexal ectopic pregnancy is again demonstrated. The ectopic gestational sac and yolk sac are visualized. The ectopic pregnancy measures about the same as previous study but is somewhat more irregular. Uterus and left ovary are unremarkable. Electronically Signed   By: Burman NievesWilliam  Stevens M.D.   On: 01/09/2017 00:18   [place] Ob Transvaginal  Result Date: 12/31/2016 CLINICAL DATA:  Acute onset of vaginal bleeding and pelvic cramping.  EXAM: TRANSVAGINAL OB ULTRASOUND TECHNIQUE: Transvaginal ultrasound was performed for complete evaluation of the gestation as well as the maternal uterus, adnexal regions, and pelvic cul-de-sac. COMPARISON:  Pelvic ultrasound performed 12/29/2016 FINDINGS: Intrauterine gestational sac: None seen. Yolk sac:  N/A Embryo:  N/A Subchorionic hemorrhage:  None visualized. Maternal uterus/adnexae: There appears to be a gestational sac with a yolk sac medial to the right ovary, compatible with ectopic pregnancy. The mean sac diameter  of 4.4 mm corresponds to a gestational age of [redacted] weeks 1 day. A left ovary is unremarkable in appearance. Trace free fluid is seen within the pelvic cul-de-sac. IMPRESSION: 1. Ectopic pregnancy noted medial to the right ovary, containing a yolk sac. No evidence of rupture at this time. 2. No intrauterine gestational sac seen. Critical Value/emergent results were called by telephone at the time of interpretation on 12/31/2016 at 3:10 am to [facility] CNM, who verbally acknowledged these results. Electronically Signed   By: Roanna Raider M.D.   On: 12/31/2016 03:12   US Ob Transvaginal  Result Date: 12/29/2016 CLINICAL DATA:  Pregnant patient with vaginal spotting. EXAM: OBSTETRIC <14 WK [place] AND TRANSVAGINAL OB US TECHNIQUE: Both transabdominal and transvaginal ultrasound examinations were performed for complete evaluation of the gestation as well as the maternal uterus, adnexal regions, and pelvic cul-de-sac. Transvaginal technique was performed to assess early pregnancy. COMPARISON:  Pelvic ultrasound 10/15/2016 FINDINGS: Intrauterine gestational sac: None Yolk sac:  Not Visualized. Embryo:  Not Visualized. Cardiac Activity: Not Visualized. Maternal uterus/adnexae: Normal right and left ovaries. Small corpus luteum in the right ovary. No free fluid in the pelvis. No adnexal mass is identified. IMPRESSION: No intrauterine gestation identified. In the setting of positive pregnancy test  and no definite intrauterine pregnancy, this reflects a pregnancy of unknown location. Differential considerations include early normal IUP, abnormal IUP, or nonvisualized ectopic pregnancy. Differentiation is achieved with serial beta HCG supplemented by repeat sonography as clinically warranted. Electronically Signed   By: Annia Belt M.D.   On: 12/29/2016 14:10    Assessment: Patient Active Problem List   Diagnosis Date Noted  . Ruptured right tubal ectopic pregnancy causing hemoperitoneum 01/16/2017    Plan: Patient will undergo surgical management with urgent operative laparoscopy, possible unilateral salpingectomy.   The risks of surgery were discussed in detail with the patient including but not limited to: bleeding which may require transfusion or reoperation; infection which may require antibiotics; injury to surrounding organs which may involve bowel or other surrounding organs, need for additional procedures including laparotomy; surgical site problems and other postoperative/anesthesia complications.  Likelihood of success in alleviating the patient's condition was discussed. Routine postoperative instructions will be reviewed with the patient in detail after surgery.  The patient concurred with the proposed plan, giving informed written consent for the surgery.  Patient has been NPO since 1730 yesterday and she will remain NPO for procedure.Preoperative SCDs ordered on call to the OR. To OR when ready.   Jaynie Collins, M.D. 01/16/2017 3:13 AM

## 2017-01-16 NOTE — Anesthesia Procedure Notes (Signed)
Procedure Name: Intubation Date/Time: 01/16/2017 3:39 AM Performed by: Audry Pili, MD Pre-anesthesia Checklist: Patient identified, Emergency Drugs available, Suction available and Patient being monitored Patient Re-evaluated:Patient Re-evaluated prior to induction Oxygen Delivery Method: Circle system utilized Preoxygenation: Pre-oxygenation with 100% oxygen Induction Type: IV induction Ventilation: Mask ventilation without difficulty Laryngoscope Size: Miller and 2 Grade View: Grade I Tube type: Oral Tube size: 7.0 mm Number of attempts: 2 (First look by CRNA with Mac 3, grade 2B view. Second look by Dr. Fransisco Beau with Mil 2, grade 1 view) Airway Equipment and Method: Stylet and Oral airway Placement Confirmation: ETT inserted through vocal cords under direct vision,  positive ETCO2 and breath sounds checked- equal and bilateral Secured at: 22 cm Tube secured with: Tape Dental Injury: Teeth and Oropharynx as per pre-operative assessment

## 2017-01-16 NOTE — Transfer of Care (Signed)
Immediate Anesthesia Transfer of Care Note  Patient: Christy Jackson  Procedure(s) Performed: LAPAROSCOPY OPERATIVE, RIGHT SALPINGECTOMY (N/A Abdomen)  Patient Location: PACU  Anesthesia Type:General  Level of Consciousness: awake, alert  and oriented  Airway & Oxygen Therapy: Patient Spontanous Breathing and Patient connected to nasal cannula oxygen  Post-op Assessment: Report given to RN and Post -op Vital signs reviewed and stable  Post vital signs: Reviewed and stable  Last Vitals:  Vitals:   01/16/17 0107 01/16/17 0215  BP: 119/83 94/80  Pulse: 86 84  Resp: 18 16  Temp: 36.6 C     Last Pain:  Vitals:   01/16/17 0328  TempSrc:   PainSc: 3          Complications: No apparent anesthesia complications

## 2017-01-16 NOTE — Op Note (Signed)
.  Christy Jackson PROCEDURE DATE: 01/16/2017  PREOPERATIVE DIAGNOSIS: Ruptured ectopic pregnancy POSTOPERATIVE DIAGNOSIS: Ruptured right fallopian tube ectopic pregnancy PROCEDURE: Laparoscopic right salpingectomy and removal of ectopic pregnancy SURGEON:  Dr. Jaynie CollinsUgonna Maclovia Uher ANESTHESIOLOGY TEAM: Anesthesiologist: Beryle LatheBrock, Thomas E, MD CRNA: Armanda HeritageSterling, Charlesetta M, CRNA  INDICATIONS: 31 y.o. G2P0010 at 2124w0d here with the preoperative diagnoses as listed above.  Please refer to preoperative notes for more details. Patient was counseled regarding need for laparoscopic salpingectomy. Risks of surgery including bleeding which may require transfusion or reoperation, infection, injury to bowel or other surrounding organs, need for additional procedures including laparotomy and other postoperative/anesthesia complications were explained to patient.  Written informed consent was obtained.  FINDINGS:  Moderate amount of hemoperitoneum estimated to be about 400 ml of blood and clots.  Dilated right fallopian tube containing ectopic gestation. Small normal appearing uterus, normal left fallopian tube, left ovary and right ovary.  ANESTHESIA: General ESTIMATED BLOOD LOSS: 400 ml SPECIMENS: Right fallopian tube containing ectopic gestation COMPLICATIONS: None immediate  PROCEDURE IN DETAIL:  The patient was taken to the operating room where general anesthesia was administered and was found to be adequate.  She was placed in the dorsal lithotomy position, and was prepped and draped in a sterile manner.  A Foley catheter was inserted into her bladder and attached to constant drainage and a uterine manipulator was then advanced into the uterus .    After an adequate timeout was performed, attention was turned to the abdomen where an umbilical incision was made with the scalpel.  The Optiview 11-mm trocar and sleeve were then advanced without difficulty with the laparoscope under direct visualization into the abdomen.   The abdomen was then insufflated with carbon dioxide gas and adequate pneumoperitoneum was obtained.  A survey of the patient's pelvis and abdomen revealed the findings above.  Two 5-mm  left lower quadrant ports were then placed under direct visualization.  The Nezhat suction irrigator was then used to suction the hemoperitoneum and irrigate the pelvis.  Attention was then turned to the right fallopian tube which was grasped and ligated from the underlying mesosalpinx and uterine attachment using the Harmonic instrument.  Good hemostasis was noted.  The specimen was placed in an EndoCatch bag and removed from the abdomen intact.  The abdomen was desufflated, and all instruments were removed.  The fascial incision of the 11-mm site was reapproximated with a 0 Vicryl figure-of-eight stitch; and all skin incisions were closed with 4-0 Monocryl and Dermabond. The patient tolerated the procedure well.  All instruments, needles, and sponge counts were correct x 2. The patient was taken to the recovery room in stable condition.   The patient will be discharged to home as per PACU criteria.  Routine postoperative instructions given.  She was prescribed Percocet, Ibuprofen and Colace.  She will follow up in the clinic in about 2-3 weeks for postoperative evaluation.   Jaynie CollinsUGONNA  Kemi Gell, MD, FACOG Attending Obstetrician & Gynecologist Faculty Practice, [facility] [place]

## 2017-01-16 NOTE — Anesthesia Postprocedure Evaluation (Signed)
Anesthesia Post Note  Patient: Christy Jackson  Procedure(s) Performed: LAPAROSCOPY OPERATIVE, RIGHT SALPINGECTOMY (N/A Abdomen)     Patient location during evaluation: PACU Anesthesia Type: General Level of consciousness: awake and alert Pain management: pain level controlled Vital Signs Assessment: post-procedure vital signs reviewed and stable Respiratory status: nonlabored ventilation, respiratory function stable and spontaneous breathing Cardiovascular status: blood pressure returned to baseline and stable Postop Assessment: no apparent nausea or vomiting Anesthetic complications: no    Last Vitals:  Vitals:   01/16/17 0615 01/16/17 0630  BP: 129/83 123/81  Pulse: 78 81  Resp: 17 12  Temp:    SpO2: 100% 95%    Last Pain:  Vitals:   01/16/17 0615  TempSrc:   PainSc: 4    Pain Goal:                 Beryle Lathehomas E Brock

## 2017-01-16 NOTE — Discharge Instructions (Signed)
Laparoscopic Surgery - Care After Laparoscopy is a surgical procedure. It is used to diagnose and treat diseases inside the belly(abdomen). It is usually a brief, common, and relatively simple procedure. The laparoscopeis a thin, lighted, pencil-sized instrument. It is like a telescope. It is inserted into your abdomen through a small cut (incision). Your caregiver can look at the organs inside your body through this instrument.  She can see if there is anything abnormal. Laparoscopy can be done either in a hospital or outpatient clinic. You may be given a mild sedative to help you relax before the procedure. Once in the operating room, you will be given a drug to make you sleep (general anesthesia). Laparoscopy usually lasts about 1 hour. After the procedure, you will be monitored in a recovery area until you are stable and doing well. Once you are home, it may take 3 to 7 days to fully recover.  Laparoscopy has relatively few risks. Your caregiver will discuss the risks with you before the procedure. Some problems that can occur include: RISKS AND COMPLICATIONS  Allergies to medicines. Difficulty breathing. Bleeding. Infection. Damage to other surrounding structures HOME CARE INSTRUCTIONS  Infection. Bleeding. Damage to other organs. Anesthetic side effects.  Need for additional procedures such as open procedures/laparotomy PROCEDURE Once you receive anesthesia, your surgeon inflates the abdomen with a harmless gas (carbon dioxide). This makes the organs easier to see. The laparoscope is inserted into the abdomen through a small incision. This allows your surgeon to see into the abdomen. Other small instruments are also inserted into the abdomen through other small openings. Many surgeons attach a video camera to the laparoscope to enlarge the view. During a laparoscopy, the surgeon may be looking for inflammation, infection, or cancer.  The surgeon may also need to take out certain organs or  take tissue samples (biopsies). The specimens are sent to a specialist in looking at cells and tissue samples (pathologist). The pathologist examines them under a microscope to help to diagnose or confirm a disease. AFTER THE PROCEDURE  The incisions are closed with stitches (sutures) and Dermabond. Because these incisions are small (usually less than 1/2 inch), there is usually minimal discomfort after the procedure. There may also be discomfort from the instrument placement incisions in the abdomen. You will be given pain medicine to ease any discomfort. You will rest in a recovery room for 1-2 hours until you are stable and doing well. You may have some mild discomfort in the throat. This is from the tube placed in your throat while you were sleeping. You may experience discomfort in the shoulder area from some trapped air between the liver and diaphragm. This sensation is normal and will slowly go away on its own. The recovery time is shortened as long as there are no complications. You will rest in a recovery room until stable and doing well. As long as there are no complications, you may be allowed to go home. Someone will need to drive you home and be with you for at least 24 hours once home. FINDING OUT THE RESULTS You will be called with the results of the pathology and will discuss these results with  your caregiver during your postoperative appointment. Do not assume everything is normal if you have not heard from your caregiver or the medical facility. It is important for you to follow up on all of your results. HOME CARE INSTRUCTIONS  Take all medicines as directed. Only take over-the-counter or prescription medicines for pain, discomfort,   CARE INSTRUCTIONS   Take all medicines as directed.  Only take over-the-counter or prescription medicines for pain, discomfort, or fever as directed by your caregiver.  Resume daily activities as directed.  Showers are preferred over baths.  You may resume sexual activities in 1 week or as directed.  Do not drive while taking  narcotics. SEEK MEDICAL CARE IF:  There is increasing abdominal pain.  You feel lightheaded or faint.  You have the chills.  You have an oral temperature above 102 F (38.9 C).  There is pus-like (purulent) drainage from any of the wounds.  You are unable to pass gas or have a bowel movement.  You feel sick to your stomach (nauseous) or throw up (vomit). MAKE SURE YOU:   Understand these instructions.  Will watch your condition.  Will get help right away if you are not doing well or get worse.  ExitCare Patient Information 2013 [place], [place].    Ruptured Ectopic Pregnancy An ectopic pregnancy is when the fertilized egg attaches (implants) outside the uterus. Most ectopic pregnancies occur in the fallopian tube. Rarely do ectopic pregnancies occur on the ovary, intestine, pelvis, or cervix. An ectopic pregnancy does not have the ability to develop into a normal, healthy baby. A ruptured ectopic pregnancy is one in which the fallopian tube gets torn or bursts and results in internal bleeding. Often there is intense abdominal pain, and sometimes, vaginal bleeding. Having an ectopic pregnancy can be a life-threatening experience. If left untreated, this dangerous condition can lead to a blood transfusion, abdominal surgery, or even death. What are the causes? Damage to the fallopian tubes is the suspected cause in most ectopic pregnancies. What increases the risk? Depending on your circumstances, the amount of risk of having an ectopic pregnancy will vary. There are 3 categories that may help you identify whether you are potentially at risk. High Risk  You have gone through infertility treatment.  You have had a previous ectopic pregnancy.  You have had previous tubal surgery.  You have had previous surgery to have the fallopian tubes tied (tubal ligation).  You have tubal problems or diseases.  You have been exposed to DES. DES is a medicine that was used until 1971 and  had effects on babies whose mothers took the medicine.  You become pregnant while using an intrauterine device (IUD) for birth control.  Moderate Risk  You have a history of infertility.  You have a history of a sexually transmitted infection (STI).  You have a history of pelvic inflammatory disease (PID).  You have scarring from endometriosis.  You have multiple sexual partners.  You smoke.  Low Risk  You have had previous pelvic surgery.  You use vaginal douching.  You became sexually active before 31 years of age.  What are the signs or symptoms? An ectopic pregnancy should be suspected in anyone who has missed a period and has abdominal pain or bleeding.  You may experience normal pregnancy symptoms, such as: ? Nausea. ? Tiredness. ? Breast tenderness.  Symptoms that are not normal include: ? Pain with intercourse. ? Irregular vaginal bleeding or spotting. ? Cramping or pain on one side, or in the lower abdomen. ? Fast heartbeat. ? Passing out while having a bowel movement.  Symptoms of a ruptured ectopic pregnancy and internal bleeding may include: ? Sudden, severe pain in the abdomen and pelvis. ? Dizziness or fainting. ? Pain in the shoulder area.  How is this diagnosed? Tests that may be performed  include:  A pregnancy test.  An ultrasound.  Testing the specific level of pregnancy hormone in the bloodstream.  Taking a sample of uterus tissue (dilation and curettage, D&C).  Surgery to perform a visual exam of the inside of the abdomen using a lighted tube (laparoscopy).  How is this treated? Laparoscopic surgery or abdominal surgery is recommended for a ruptured ectopic pregnancy.  The whole fallopian tube may need to be removed (salpingectomy).  If the tube is not too damaged, the tube may be saved, and the pregnancy will be surgically removed. Intime, the tube may still function.  If you have lost a lot of blood, you may need a blood  transfusion.  You may receive a Rho (D) immune globulin shot if you are Rh negative and the father is Rh positive, or if you do not know the Rh type of the father. This is to prevent problems with any future pregnancy.  Get help right away if: You have any symptoms of an ectopic or ruptured ectopic pregnancy. This is a medical emergency. This information is not intended to replace advice given to you by your health care provider. Make sure you discuss any questions you have with your health care provider. Document Released: 01/29/2000 Document Revised: 08/21/2015 Document Reviewed: 11/12/2012 Else[employer] Interactive Patient Education  2018 [employer]   DISCHARGE INSTRUCTIONS: Laparoscopy  The following instructions have been prepared to help you care for yourself upon your return home today.  Wound care:  Do not get the incision wet for the first 24 hours. The incision should be kept clean and dry.  The Band-Aids or dressings may be removed the day after surgery.  Should the incision become sore, red, and swollen after the first week, check with your doctor.  Personal hygiene:  Shower the day after your procedure.  Activity and limitations:  Do NOT drive or operate any equipment today.  Do NOT lift anything more than 15 pounds for 2-3 weeks after surgery.  Do NOT rest in bed all day.  Walking is encouraged. Walk each day, starting slowly with 5-minute walks 3 or 4 times a day. Slowly increase the length of your walks.  Walk up and down stairs slowly.  Do NOT do strenuous activities, such as golfing, playing tennis, bowling, running, biking, weight lifting, gardening, mowing, or vacuuming for 2-4 weeks. Ask your doctor when it is okay to start.  Diet: Eat a light meal as desired this evening. You may resume your usual diet tomorrow.  Return to work: This is dependent on the type of work you do. For the most part you can return to a desk job within a week of surgery. If you  are more active at work, please discuss this with your doctor.  What to expect after your surgery: You may have a slight burning sensation when you urinate on the first day. You may have a very small amount of blood in the urine. Expect to have a small amount of vaginal discharge/light bleeding for 1-2 weeks. It is not unusual to have abdominal soreness and bruising for up to 2 weeks. You may be tired and need more rest for about 1 week. You may experience shoulder pain for 24-72 hours. Lying flat in bed may relieve it.  Call your doctor for any of the following:  Develop a fever of 100.4 or greater  Inability to urinate 6 hours after discharge from hospital  Severe pain not relieved by pain medications  Persistent of heavy  bleeding at incision site  Redness or swelling around incision site after a week  Increasing nausea or vomiting  Patient Signature________________________________________ Nurse Signature_________________________________________    Post Anesthesia Home Care Instructions  Activity: Get plenty of rest for the remainder of the day. A responsible individual must stay with you for 24 hours following the procedure.  For the next 24 hours, DO NOT: -Drive a car -Advertising copywriterperate machinery -Drink alcoholic beverages -Take any medication unless instructed by your physician -Make any legal decisions or sign important papers.  Meals: Start with liquid foods such as gelatin or soup. Progress to regular foods as tolerated. Avoid greasy, spicy, heavy foods. If nausea and/or vomiting occur, drink only clear liquids until the nausea and/or vomiting subsides. Call your physician if vomiting continues.  Special Instructions/Symptoms: Your throat may feel dry or sore from the anesthesia or the breathing tube placed in your throat during surgery. If this causes discomfort, gargle with warm salt water. The discomfort should disappear within 24 hours.  If you had a scopolamine patch placed  behind your ear for the management of post- operative nausea and/or vomiting:  1. The medication in the patch is effective for 72 hours, after which it should be removed.  Wrap patch in a tissue and discard in the trash. Wash hands thoroughly with soap and water. 2. You may remove the patch earlier than 72 hours if you experience unpleasant side effects which may include dry mouth, dizziness or visual disturbances. 3. Avoid touching the patch. Wash your hands with soap and water after contact with the patch.

## 2017-01-16 NOTE — MAU Provider Note (Signed)
Chief Complaint: Abdominal Pain   First Provider Initiated Contact with Patient 01/16/17 0130      SUBJECTIVE HPI: Christy Jackson is a 31 y.o. G2P0010 at [redacted]w[redacted]d by LMP who presents to maternity admissions reporting sudden increase in RLQ pain tonight. She has diagnosed right ectopic pregnancy and received MTX on 12/31/16. Her quant hcg went from 2001 on 11/17 (Day1) to 4407 on Day 4 to 3481 on Day 7. She presented on Day 8 with increased pain but quant hcg and [place] were unchanged. She reports she has taken Percocet 1 or 2 tabs every day since the MTX and it usually resolves her pain. Tonight, however, the pain started around 7 pm and she took 2 Percocet but it did not relieve the pain. Upon arrival in MAU she reports pain is worsening quickly and she is guarding her right lower abdomen in pain.  The pain is associated with vomiting x 1.  She has light vaginal bleeding, unchanged in the last week. She reports constipation with no bowel movement x 4 days until today. The pain started after her bowel movement. There are no other associated symptoms.    HPI  Past Medical History:  Diagnosis Date  . Medical history non-contributory    Past Surgical History:  Procedure Laterality Date  . NO PAST SURGERIES     Social History   Socioeconomic History  . Marital status: Married    Spouse name: Not on file  . Number of children: Not on file  . Years of education: Not on file  . Highest education level: Not on file  Social Needs  . Financial resource strain: Not on file  . Food insecurity - worry: Not on file  . Food insecurity - inability: Not on file  . Transportation needs - medical: Not on file  . Transportation needs - non-medical: Not on file  Occupational History  . Not on file  Tobacco Use  . Smoking status: Never Smoker  . Smokeless tobacco: Never Used  Substance and Sexual Activity  . Alcohol use: No  . Drug use: No  . Sexual activity: Yes  Other Topics Concern  . Not on file   Social History Narrative  . Not on file   No current facility-administered medications on file prior to encounter.    Current Outpatient Medications on File Prior to Encounter  Medication Sig Dispense Refill  . ondansetron (ZOFRAN) 4 MG tablet Take 1 tablet (4 mg total) every 8 (eight) hours as needed by mouth for nausea or vomiting. 20 tablet 0  . oxyCODONE-acetaminophen (PERCOCET/ROXICET) 5-325 MG tablet Take 1 tablet every 6 (six) hours as needed by mouth for moderate pain. 20 tablet 0   No Known Allergies  ROS:  Review of Systems  Constitutional: Negative for chills, fatigue and fever.  Respiratory: Negative for shortness of breath.   Cardiovascular: Negative for chest pain.  Gastrointestinal: Positive for abdominal pain, constipation, nausea and vomiting.  Genitourinary: Positive for pelvic pain. Negative for difficulty urinating, dysuria, flank pain, vaginal bleeding, vaginal discharge and vaginal pain.  Neurological: Negative for dizziness and headaches.  Psychiatric/Behavioral: Negative.      I have reviewed patient's Past Medical Hx, Surgical Hx, Family Hx, Social Hx, medications and allergies.   Physical Exam   Patient Vitals for the past 24 hrs:  BP Temp Temp src Pulse Resp Height Weight  01/16/17 0215 94/80 - - 84 16 - -  01/16/17 0107 119/83 97.8 F (36.6 C) Oral 86 18 5\' 5"  (  1.651 m) 199 lb (90.3 kg)   Constitutional: Well-developed, well-nourished female in no acute distress.  Cardiovascular: normal rate Respiratory: normal effort GI: Abd soft, non-tender. Pos BS x 4 MS: Extremities nontender, no edema, normal ROM Neurologic: Alert and oriented x 4.  GU: Neg CVAT.  PELVIC EXAM:    LAB RESULTS Results for orders placed or performed during the hospital encounter of 01/16/17 (from the past 24 hour(s))  hCG, quantitative, pregnancy     Status: Abnormal   Collection Time: 01/16/17  1:27 AM  Result Value Ref Range   hCG, Beta Chain, Quant, S 822 (H) <5  mIU/mL  CBC     Status: Abnormal   Collection Time: 01/16/17  1:27 AM  Result Value Ref Range   WBC 8.5 4.0 - 10.5 K/uL   RBC 3.98 3.87 - 5.11 MIL/uL   Hemoglobin 11.6 (L) 12.0 - 15.0 g/dL   HCT 16.1 (L) 09.6 - 04.5 %   MCV 86.2 78.0 - 100.0 fL   MCH 29.1 26.0 - 34.0 pg   MCHC 33.8 30.0 - 36.0 g/dL   RDW 40.9 81.1 - 91.4 %   Platelets 249 150 - 400 K/uL    --/--/O POS (11/15 1134)  IMAGING US Ob Comp Less 14 Wks  Result Date: 12/29/2016 CLINICAL DATA:  Pregnant patient with vaginal spotting. EXAM: OBSTETRIC <14 WK [place] AND TRANSVAGINAL OB US TECHNIQUE: Both transabdominal and transvaginal ultrasound examinations were performed for complete evaluation of the gestation as well as the maternal uterus, adnexal regions, and pelvic cul-de-sac. Transvaginal technique was performed to assess early pregnancy. COMPARISON:  Pelvic ultrasound 10/15/2016 FINDINGS: Intrauterine gestational sac: None Yolk sac:  Not Visualized. Embryo:  Not Visualized. Cardiac Activity: Not Visualized. Maternal uterus/adnexae: Normal right and left ovaries. Small corpus luteum in the right ovary. No free fluid in the pelvis. No adnexal mass is identified. IMPRESSION: No intrauterine gestation identified. In the setting of positive pregnancy test and no definite intrauterine pregnancy, this reflects a pregnancy of unknown location. Differential considerations include early normal IUP, abnormal IUP, or nonvisualized ectopic pregnancy. Differentiation is achieved with serial beta HCG supplemented by repeat sonography as clinically warranted. Electronically Signed   By: Annia Belt M.D.   On: 12/29/2016 14:10   US Ob Transvaginal  Result Date: 01/16/2017 CLINICAL DATA:  Increasing abdominal pain. Known right adnexal ectopic pregnancy. EXAM: TRANSVAGINAL OB ULTRASOUND TECHNIQUE: Transvaginal ultrasound was performed for complete evaluation of the gestation as well as the maternal uterus, adnexal regions, and pelvic cul-de-sac.  COMPARISON:  01/09/2017 FINDINGS: The previously demonstrated ectopic pregnancy with embryo and yolk sac within the right adnexa is not as well demonstrated. There is persistent enlargement of the right adnexa. There has been interval development of moderate amount of complex fluid within the right and left adnexa as well as pelvic cul-de-sac. The uterus has normal appearance. IMPRESSION: Known right adnexal ectopic pregnancy not as well defined as on the prior exams. Moderate amount of hemorrhagic fluid within the pelvis, concerning for ruptured ectopic pregnancy with bleeding into the pelvis. These results were called by telephone at the time of interpretation on 01/16/2017 at 2:31 am to Dr. Sharen Counter , who verbally acknowledged these results. Electronically Signed   By: Ted Mcalpine M.D.   On: 01/16/2017 02:33   US Ob Transvaginal  Result Date: 01/09/2017 CLINICAL DATA:  Left lower quadrant pain, spotting, and cramping. Estimated gestational age by LMP is 5 weeks 6 days. Patient is status post methotrexate for right ectopic  pregnancy seen on prior ultrasound 12/31/2016. Quantitative beta HCG was 06/14/1979 on 01/07/2017. EXAM: TRANSVAGINAL OB ULTRASOUND TECHNIQUE: Transvaginal ultrasound was performed for complete evaluation of the gestation as well as the maternal uterus, adnexal regions, and pelvic cul-de-sac. COMPARISON:  12/31/2016 FINDINGS: Intrauterine gestational sac: None Yolk sac:  Not Visualized. Embryo:  Not Visualized. Cardiac Activity: Not Visualized. Maternal uterus/adnexae: Uterus appears mildly anteverted. No myometrial mass lesions. Endometrial stripe thickness is normal and no endometrial fluid is identified. The left ovary is unremarkable. Adjacent to the right ovary, there is a gestational sac with a yolk sac apparent. This is consistent with right adnexal ectopic pregnancy. Color flow Doppler images of the area were not obtained today. The ectopic pregnancy measures about  1.5 x 1.7 x 2.1 cm. This is about the same size as previously, but the structure appears somewhat more irregular than previously. No fetal pole is definitively identified. Small amount of free fluid in the pelvis. IMPRESSION: A right adnexal ectopic pregnancy is again demonstrated. The ectopic gestational sac and yolk sac are visualized. The ectopic pregnancy measures about the same as previous study but is somewhat more irregular. Uterus and left ovary are unremarkable. Electronically Signed   By: Burman NievesWilliam  Stevens M.D.   On: 01/09/2017 00:18   [place] Ob Transvaginal  Result Date: 12/31/2016 CLINICAL DATA:  Acute onset of vaginal bleeding and pelvic cramping. EXAM: TRANSVAGINAL OB ULTRASOUND TECHNIQUE: Transvaginal ultrasound was performed for complete evaluation of the gestation as well as the maternal uterus, adnexal regions, and pelvic cul-de-sac. COMPARISON:  Pelvic ultrasound performed 12/29/2016 FINDINGS: Intrauterine gestational sac: None seen. Yolk sac:  N/A Embryo:  N/A Subchorionic hemorrhage:  None visualized. Maternal uterus/adnexae: There appears to be a gestational sac with a yolk sac medial to the right ovary, compatible with ectopic pregnancy. The mean sac diameter of 4.4 mm corresponds to a gestational age of [redacted] weeks 1 day. A left ovary is unremarkable in appearance. Trace free fluid is seen within the pelvic cul-de-sac. IMPRESSION: 1. Ectopic pregnancy noted medial to the right ovary, containing a yolk sac. No evidence of rupture at this time. 2. No intrauterine gestational sac seen. Critical Value/emergent results were called by telephone at the time of interpretation on 12/31/2016 at 3:10 am to [facility] CenterKATHRYN KOOISTRA CNM, who verbally acknowledged these results. Electronically Signed   By: Roanna RaiderJeffery  Chang M.D.   On: 12/31/2016 03:12   [place] Ob Transvaginal  Result Date: 12/29/2016 CLINICAL DATA:  Pregnant patient with vaginal spotting. EXAM: OBSTETRIC <14 WK US AND TRANSVAGINAL OB US TECHNIQUE: Both  transabdominal and transvaginal ultrasound examinations were performed for complete evaluation of the gestation as well as the maternal uterus, adnexal regions, and pelvic cul-de-sac. Transvaginal technique was performed to assess early pregnancy. COMPARISON:  Pelvic ultrasound 10/15/2016 FINDINGS: Intrauterine gestational sac: None Yolk sac:  Not Visualized. Embryo:  Not Visualized. Cardiac Activity: Not Visualized. Maternal uterus/adnexae: Normal right and left ovaries. Small corpus luteum in the right ovary. No free fluid in the pelvis. No adnexal mass is identified. IMPRESSION: No intrauterine gestation identified. In the setting of positive pregnancy test and no definite intrauterine pregnancy, this reflects a pregnancy of unknown location. Differential considerations include early normal IUP, abnormal IUP, or nonvisualized ectopic pregnancy. Differentiation is achieved with serial beta HCG supplemented by repeat sonography as clinically warranted. Electronically Signed   By: Annia Beltrew  Davis M.D.   On: 12/29/2016 14:10    MAU Management/MDM: CBC, quant hcg, type and screen and transvaginal US US performed bedside due to  pt pain Radiology called to report moderate complex fluid in abdomen, and decreased visibility of previous ectopic pregnancy, c/w ruptured ectopic pregnancy Consult Dr Macon LargeAnyanwu, who is present at bedside to discuss US findings and recommended surgical intervention   ASSESSMENT 1. Ruptured right tubal ectopic pregnancy causing hemoperitoneum   2. Abdominal pain during pregnancy in first trimester   3. Right tubal pregnancy without intrauterine pregnancy     PLAN Admit to OR   Sharen CounterLisa Leftwich-Kirby Certified Nurse-Midwife 01/16/2017  2:55 AM

## 2017-01-16 NOTE — MAU Note (Addendum)
Pt reports worsening pain this evening, unrelieved w/ percocet. The percocet has been helping up until today. Pt also reports not having a BM for 4 days. Had a BM today and was very painful

## 2017-01-16 NOTE — Anesthesia Preprocedure Evaluation (Addendum)
Anesthesia Evaluation  Patient identified by MRN, date of birth, ID band Patient awake    Reviewed: Allergy & Precautions, NPO status , Patient's Chart, lab work & pertinent test results  Airway Mallampati: II  TM Distance: >3 FB Neck ROM: Full    Dental  (+) Dental Advisory Given, Teeth Intact   Pulmonary neg pulmonary ROS,    Pulmonary exam normal breath sounds clear to auscultation       Cardiovascular negative cardio ROS Normal cardiovascular exam Rhythm:Regular Rate:Normal     Neuro/Psych negative neurological ROS  negative psych ROS   GI/Hepatic negative GI ROS, Neg liver ROS,   Endo/Other  Obesity  Renal/GU negative Renal ROS  negative genitourinary   Musculoskeletal negative musculoskeletal ROS (+)   Abdominal   Peds  Hematology  (+) anemia ,   Anesthesia Other Findings   Reproductive/Obstetrics Ruptured ectopic pregnancy                            Anesthesia Physical Anesthesia Plan  ASA: II and emergent  Anesthesia Plan: General   Post-op Pain Management:    Induction: Intravenous  PONV Risk Score and Plan: 4 or greater and Treatment may vary due to age or medical condition, Scopolamine patch - Pre-op, Midazolam, Dexamethasone and Ondansetron  Airway Management Planned: Oral ETT  Additional Equipment: None  Intra-op Plan:   Post-operative Plan: Extubation in OR  Informed Consent: I have reviewed the patients History and Physical, chart, labs and discussed the procedure including the risks, benefits and alternatives for the proposed anesthesia with the patient or authorized representative who has indicated his/her understanding and acceptance.   Dental advisory given  Plan Discussed with: CRNA  Anesthesia Plan Comments:         Anesthesia Quick Evaluation

## 2017-01-16 NOTE — Anesthesia Procedure Notes (Signed)
Procedure Name: Intubation Date/Time: 01/16/2017 3:41 AM Performed by: Armanda HeritageSterling, John Vasconcelos M, CRNA Pre-anesthesia Checklist: Patient identified, Emergency Drugs available, Suction available, Patient being monitored and Timeout performed Patient Re-evaluated:Patient Re-evaluated prior to induction Oxygen Delivery Method: Circle system utilized Preoxygenation: Pre-oxygenation with 100% oxygen Induction Type: IV induction Laryngoscope Size: Miller and 3 Grade View: Grade II Tube type: Oral Tube size: 7.0 mm Number of attempts: 2 Placement Confirmation: ETT inserted through vocal cords under direct vision,  breath sounds checked- equal and bilateral and positive ETCO2 Secured at: 21 cm Dental Injury: Teeth and Oropharynx as per pre-operative assessment

## 2017-01-17 ENCOUNTER — Encounter (HOSPITAL_COMMUNITY): Payer: Self-pay | Admitting: Obstetrics & Gynecology

## 2017-01-31 ENCOUNTER — Encounter (HOSPITAL_COMMUNITY): Payer: Self-pay | Admitting: *Deleted

## 2017-02-10 ENCOUNTER — Ambulatory Visit (INDEPENDENT_AMBULATORY_CARE_PROVIDER_SITE_OTHER): Payer: Medicaid Other | Admitting: Obstetrics & Gynecology

## 2017-02-10 ENCOUNTER — Encounter: Payer: Self-pay | Admitting: Obstetrics & Gynecology

## 2017-02-10 VITALS — BP 112/75 | HR 90 | Wt 203.0 lb

## 2017-02-10 DIAGNOSIS — K661 Hemoperitoneum: Secondary | ICD-10-CM

## 2017-02-10 DIAGNOSIS — O00101 Right tubal pregnancy without intrauterine pregnancy: Principal | ICD-10-CM

## 2017-02-10 DIAGNOSIS — Z09 Encounter for follow-up examination after completed treatment for conditions other than malignant neoplasm: Secondary | ICD-10-CM

## 2017-02-10 DIAGNOSIS — O039 Complete or unspecified spontaneous abortion without complication: Secondary | ICD-10-CM

## 2017-02-10 DIAGNOSIS — Z9889 Other specified postprocedural states: Secondary | ICD-10-CM

## 2017-02-10 NOTE — Progress Notes (Signed)
Subjective:     Christy Jackson is a 31 y.o. [id number] female who presents to the clinic 4 weeks status post laparoscopic right salpingectomy for ruptured right fallopian tube ectopic pregnancy.  Eating a regular diet without difficulty. Bowel movements are normal. The patient is not having any pain.  She desires evaluation for SAB, very adamant about being evaluated for blood disorders "Don't want to lose another pregnancy before evaluation".  The following portions of the patient's history were reviewed and updated as appropriate: allergies, current medications, past family history, past medical history, past social history, past surgical history and problem list.    Review of Systems Pertinent items noted in HPI and remainder of comprehensive ROS otherwise negative.    Objective:    BP 112/75   Pulse 90   Wt 203 lb (92.1 kg)   LMP 11/28/2016   BMI 33.78 kg/m  General:  alert and no distress  Abdomen: soft, bowel sounds active, non-tender  Incision:   healing well, no drainage, no erythema, no hernia, no seroma, no swelling, no dehiscence, incision well approximated    01/16/2017  Surgical pathology Fallopian tube, ectopic pregnancy, right - BENIGN FALLOPIAN TUBE WITH CHORIONIC VILLI CONSISTENT WITH ECTOPIC PREGNANCY.  Assessment:    Doing well postoperatively. Operative findings again reviewed. Pathology report discussed.    Plan:   1. Ruptured right tubal ectopic pregnancy causing hemoperitoneum 2. Postop check 1. Continue any current medications. 2. Wound care discussed. 3. Activity restrictions: none 4. Anticipated return to work: not applicable.  3. SAB (spontaneous abortion) Labs ordered. Carrier genetic screen [employer]) also offered for patient and her husband, directed to read about it on website. She will make a decision and let us know.  - Antithrombin III - Protein C activity - Protein C, total - Protein S activity - Protein S, total - Lupus anticoagulant panel -  Beta-2-glycoprotein i abs, IgG/M/A - Homocysteine, serum - Factor 5 leiden - Prothrombin gene mutation - Cardiolipin antibodies, IgG, IgM, IgA - TSH - Hemoglobin A1c   Jaynie CollinsUGONNA  Dennys Traughber, MD, FACOG Obstetrician & Gynecologist, Biochemist, clinicalaculty [facility], [facility]

## 2017-02-10 NOTE — Patient Instructions (Addendum)
[email or web address]  [employer] genetic testing if you want

## 2017-02-17 LAB — HEMOGLOBIN A1C
Est. average glucose Bld gHb Est-mCnc: 120 mg/dL
HEMOGLOBIN A1C: 5.8 % — AB (ref 4.8–5.6)

## 2017-02-17 LAB — LUPUS ANTICOAGULANT PANEL
DRVVT: 34.3 s (ref 0.0–47.0)
PTT Lupus Anticoagulant: 34.9 s (ref 0.0–51.9)

## 2017-02-17 LAB — CARDIOLIPIN ANTIBODIES, IGG, IGM, IGA
Anticardiolipin IgA: <9 APL U/mL (ref 0–11)
Anticardiolipin IgM: <9 MPL U/mL (ref 0–12)

## 2017-02-17 LAB — BETA-2-GLYCOPROTEIN I ABS, IGG/M/A
Beta-2 Glyco 1 IgA: <9 GPI IgA units (ref 0–25)
Beta-2 Glyco I IgG: <9 GPI IgG units (ref 0–20)

## 2017-02-17 LAB — PROTEIN S ACTIVITY: PROTEIN S ACTIVITY: 89 % (ref 63–140)

## 2017-02-17 LAB — TSH: TSH: 2.39 u[IU]/mL (ref 0.450–4.500)

## 2017-02-17 LAB — HOMOCYSTEINE: HOMOCYSTEINE: 6.1 umol/L (ref 0.0–15.0)

## 2017-02-17 LAB — PROTHROMBIN GENE MUTATION

## 2017-02-17 LAB — FACTOR 5 LEIDEN

## 2017-02-17 LAB — PROTEIN S, TOTAL: Protein S Ag, Total: 81 % (ref 60–150)

## 2017-02-17 LAB — PROTEIN C, TOTAL: PROTEIN C ANTIGEN: 105 % (ref 60–150)

## 2017-02-17 LAB — PROTEIN C ACTIVITY: Protein C Activity: 131 % (ref 73–180)

## 2017-02-17 LAB — ANTITHROMBIN III: AntiThromb III Func: 110 % (ref 75–135)

## 2017-02-20 ENCOUNTER — Telehealth: Payer: Self-pay | Admitting: General Practice

## 2017-02-20 NOTE — Telephone Encounter (Signed)
Called patient & informed of results and recommendation. Patient verbalized understanding & had no questions

## 2017-02-20 NOTE — Telephone Encounter (Signed)
-----   Message from Catalina AntiguaPeggy Constant, MD sent at 02/20/2017  9:13 AM EST ----- Please inform patient of normal laboratory values with the exception of blood test demonstrating that she is pre-diabetic. She should reduce her carbohydrate intake and increase protein and fiber intake. She should exercise regularly with the goal of 150 minutes of moderate cardio activity per week  She should continue taking prenatal vitamins  Thanks  MariemouthPeggy

## 2017-03-05 ENCOUNTER — Encounter: Payer: Self-pay | Admitting: Obstetrics & Gynecology

## 2017-05-27 ENCOUNTER — Encounter: Payer: Self-pay | Admitting: Family Medicine

## 2017-05-27 ENCOUNTER — Other Ambulatory Visit: Payer: Self-pay

## 2017-05-27 ENCOUNTER — Ambulatory Visit: Payer: BLUE CROSS/BLUE SHIELD | Admitting: Family Medicine

## 2017-05-27 VITALS — BP 116/78 | HR 76 | Temp 98.0°F | Resp 16 | Ht 64.96 in | Wt 200.0 lb

## 2017-05-27 DIAGNOSIS — R7303 Prediabetes: Secondary | ICD-10-CM

## 2017-05-27 DIAGNOSIS — R1032 Left lower quadrant pain: Secondary | ICD-10-CM

## 2017-05-27 DIAGNOSIS — N926 Irregular menstruation, unspecified: Secondary | ICD-10-CM

## 2017-05-27 DIAGNOSIS — J029 Acute pharyngitis, unspecified: Secondary | ICD-10-CM

## 2017-05-27 DIAGNOSIS — Z3A01 Less than 8 weeks gestation of pregnancy: Secondary | ICD-10-CM

## 2017-05-27 LAB — POCT URINALYSIS DIP (MANUAL ENTRY)
BILIRUBIN UA: NEGATIVE
BILIRUBIN UA: NEGATIVE mg/dL
Blood, UA: NEGATIVE
GLUCOSE UA: NEGATIVE mg/dL
Nitrite, UA: NEGATIVE
Protein Ur, POC: NEGATIVE mg/dL
SPEC GRAV UA: 1.02 (ref 1.010–1.025)
Urobilinogen, UA: 0.2 E.U./dL
pH, UA: 7 (ref 5.0–8.0)

## 2017-05-27 LAB — POC MICROSCOPIC URINALYSIS (UMFC): MUCUS RE: ABSENT

## 2017-05-27 LAB — POCT RAPID STREP A (OFFICE): Rapid Strep A Screen: NEGATIVE

## 2017-05-27 LAB — POCT URINE PREGNANCY: PREG TEST UR: POSITIVE — AB

## 2017-05-27 NOTE — Progress Notes (Addendum)
Subjective:  By signing my name below, I, Stann Oresung-Kai Tsai, attest that this documentation has been prepared under the direction and in the presence of Norberto SorensonEva Avyukth Bontempo, MD. Electronically Signed: Stann Oresung-Kai Tsai, Scribe. 05/27/2017 , 4:11 PM .  Patient was seen in Room 1 .   Patient ID: Christy Jackson, female    DOB: [birth year], 32 y.o.   MRN: [id number] Chief Complaint  Patient presents with  . Amenorrhea    pt states she missed her cycle and would like a pregancay test  . Sore Throat   HPI Christy Jackson is a 32 y.o. female who [facility] complaining of missed menses and would like a pregnancy test. Patient was seen 7 months ago, and had a miscarriage. About 5 months ago, she had a tubal pregnancy that ruptured, which needed emergency laparoscopy and unilateral salpingectomy done by Dr. Macon LargeAnyanwu. She's been taking prenatal vitamins.   She also complains of sore throat with some ear pain that started 2 days ago. She hasn't taken any medication for this. She denies cough, sneezing, fever or chills.   Past Medical History:  Diagnosis Date  . Medical history non-contributory    Past Surgical History:  Procedure Laterality Date  . LAPAROSCOPY N/A 01/16/2017   Procedure: LAPAROSCOPY OPERATIVE, RIGHT SALPINGECTOMY;  Surgeon: Tereso NewcomerAnyanwu, Ugonna A, MD;  Location: [facility];  Service: Gynecology;  Laterality: N/A;   Prior to Admission medications   Medication Sig Start Date End Date Taking? Authorizing Provider  docusate sodium (COLACE) 100 MG capsule Take 1 capsule (100 mg total) by mouth 2 (two) times daily as needed for mild constipation or moderate constipation. 01/16/17   Anyanwu, Jethro BastosUgonna A, MD  ibuprofen (ADVIL,MOTRIN) 600 MG tablet Take 1 tablet (600 mg total) by mouth every 6 (six) hours as needed for headache, mild pain, moderate pain or cramping. 01/16/17   Anyanwu, Jethro BastosUgonna A, MD   No Known Allergies History reviewed. No pertinent family history. Social History   Socioeconomic  History  . Marital status: Married    Spouse name: Not on file  . Number of children: Not on file  . Years of education: Not on file  . Highest education level: Not on file  Occupational History  . Not on file  Social Needs  . Financial resource strain: Not on file  . Food insecurity:    Worry: Not on file    Inability: Not on file  . Transportation needs:    Medical: Not on file    Non-medical: Not on file  Tobacco Use  . Smoking status: Never Smoker  . Smokeless tobacco: Never Used  Substance and Sexual Activity  . Alcohol use: No  . Drug use: No  . Sexual activity: Yes    Birth control/protection: None  Lifestyle  . Physical activity:    Days per week: Not on file    Minutes per session: Not on file  . Stress: Not on file  Relationships  . Social connections:    Talks on phone: Not on file    Gets together: Not on file    Attends religious service: Not on file    Active member of club or organization: Not on file    Attends meetings of clubs or organizations: Not on file    Relationship status: Not on file  Other Topics Concern  . Not on file  Social History Narrative  . Not on file   [facility] 05/27/2017 10/31/2016 10/15/2016  Decreased Interest  0 0 0  Down, Depressed, Hopeless 0 0 0  PHQ - 2 Score 0 0 0  Altered sleeping - 0 -  Tired, decreased energy - 0 -  Change in appetite - 0 -  Feeling bad or failure about yourself  - 0 -  Trouble concentrating - 0 -  Moving slowly or fidgety/restless - 0 -  Suicidal thoughts - 0 -  PHQ-9 Score - 0 -    Review of Systems  Constitutional: Negative for chills, fatigue, fever and unexpected weight change.  HENT: Positive for ear pain and sore throat. Negative for congestion, rhinorrhea and sneezing.   Respiratory: Negative for cough, shortness of breath and wheezing.   Gastrointestinal: Negative for constipation, diarrhea, nausea and vomiting.  Genitourinary: Positive for menstrual problem.  Skin:  Negative for rash and wound.  Neurological: Negative for dizziness, weakness and headaches.       Objective:   Physical Exam  Constitutional: She is oriented to person, place, and time. She appears well-developed and well-nourished. No distress.  HENT:  Head: Normocephalic and atraumatic.  Right Ear: Tympanic membrane and ear canal normal.  Mouth/Throat: Oropharynx is clear and moist. No oropharyngeal exudate or posterior oropharyngeal erythema.  Left ear: cerumen impaction Oropharynx: 2+ tonsils with clear postnasal drip  Eyes: Pupils are equal, round, and reactive to light. EOM are normal.  Neck: Neck supple.  Cardiovascular: Normal rate, regular rhythm, S1 normal, S2 normal and normal heart sounds.  No murmur heard. Pulmonary/Chest: Effort normal and breath sounds normal. No respiratory distress. She has no wheezes.  Abdominal: Soft. Bowel sounds are normal. She exhibits no distension and no mass. There is no tenderness. There is no rebound and no guarding.  Musculoskeletal: Normal range of motion.  Neurological: She is alert and oriented to person, place, and time.  Skin: Skin is warm and dry.  Psychiatric: She has a normal mood and affect. Her behavior is normal.  Nursing note and vitals reviewed.   BP 116/78   Pulse 76   Temp 98 F (36.7 C) (Oral)   Resp 16   Ht 5' 4.96" (1.65 m)   Wt 200 lb (90.7 kg)   LMP 04/28/2017   SpO2 96%   BMI 33.32 kg/m   Results for orders placed or performed in visit on 05/27/17  POCT urine pregnancy  Result Value Ref Range   Preg Test, Ur Positive (A) Negative  POCT rapid strep A  Result Value Ref Range   Rapid Strep A Screen Negative Negative  POCT urinalysis dipstick  Result Value Ref Range   Color, UA yellow yellow   Clarity, UA clear clear   Glucose, UA negative negative mg/dL   Bilirubin, UA negative negative   Ketones, POC UA negative negative mg/dL   Spec Grav, UA 1.610 9.604 - 1.025   Blood, UA negative negative   pH,  UA 7.0 5.0 - 8.0   Protein Ur, POC negative negative mg/dL   Urobilinogen, UA 0.2 0.2 or 1.0 E.U./dL   Nitrite, UA Negative Negative   Leukocytes, UA Large (3+) (A) Negative  POCT Microscopic Urinalysis ([facility])  Result Value Ref Range   WBC,UR,HPF,POC Few (A) None WBC/hpf   RBC,UR,HPF,POC None None RBC/hpf   Bacteria Few (A) None, Too numerous to count   Mucus Absent Absent   Epithelial Cells, UR Per Microscopy Moderate (A) None, Too numerous to count cells/hpf       Assessment & Plan:   1. Missed menses   2. Acute  pharyngitis, unspecified etiology   3. Left lower quadrant pain   4. Prediabetes   5. Less than [redacted] weeks gestation of pregnancy    Pt with a difficult prior 2 pregnancies.in the past 7+ months - first with spontaneous miscarriage and second was ectopic tubal pregnancy that initially was thought to be treated by MTX but then ruptured and required emergency Rt salpingectomy. Pt understandably very nervous about this 3rd pregnancy as she and her husband really want to start a family so feeling very excited and very terrified due to her prior experiences both simultaneously.   Advised pt that I am happy to send her for a [place] next week but would just be looking for gestational sac - will not be able to hear heartbeat on [place] until 6-8 wks - so pt ok to defer. Advised pt that she may want to follow w/ her gyn much closer than usual where the first visit isn't until more around ~12 wks. Call Dr. Mont Dutton office this week and schedule visit. Reassured that likely LLQ cramping due poss to constipation - not concerning as mild and exam nml, no vaginal spotting os cont watchful waiting.   Orders Placed This Encounter  Procedures  . Culture, Group A Strep    Order Specific Question:   Source    Answer:   oropharynx  . Urine Culture  . Beta hCG quant (ref lab)  . Hemoglobin A1c  . CBC with Differential/Platelet  . POCT urine pregnancy  . POCT rapid strep A  . POCT urinalysis  dipstick  . POCT Microscopic Urinalysis ([facility])     I personally performed the services described in this documentation, which was scribed in my presence. The recorded information has been reviewed and considered, and addended by me as needed.   Norberto Sorenson, M.D.  Primary Care at [facility] [address] [place], [place] [address] [phone] phone [phone] fax  05/27/17 4:41 PM

## 2017-05-27 NOTE — Patient Instructions (Addendum)
You can apply Murine or Debrox Ear wax softener drops to your left ear every night x 5 nights if you continue to have left ear pain, then the wax should drain out in the shower.  I suspect your sore throat and ear pain is likely from a virus that your body is fighting off. Make sure you are drinking lots of fluids, try hot tea with honey and lemon, try gargling with salt water (put as much salt in it as you can).   You are 4 weeks and 1 day along today and your due date will be 02/02/2018.  Please call Dr. Arther Abbott office on Monday morning to schedule a new OB appointment. Let them know that you would like to be seen asap due to your recent salpingectomy from a tubal pregnancy and prior miscarriage.  IF you received an x-ray today, you will receive an invoice from [facility]. Please contact [facility] at [phone] with questions or concerns regarding your invoice.   IF you received labwork today, you will receive an invoice from [facility]. Please contact [facility] at [phone] with questions or concerns regarding your invoice.   Our billing staff will not be able to assist you with questions regarding bills from these companies.  You will be contacted with the lab results as soon as they are available. The fastest way to get your results is to activate your My Chart account. Instructions are located on the last page of this paperwork. If you have not heard from [place] regarding the results in 2 weeks, please contact this office.      First Trimester of Pregnancy The first trimester of pregnancy is from week 1 until the end of week 13 (months 1 through 3). A week after a sperm fertilizes an egg, the egg will implant on the wall of the uterus. This embryo will begin to develop into a baby. Genes from you and your partner will form the baby. The female genes will determine whether the baby will be a boy or a girl. At 6-8 weeks, the eyes and face will be formed, and the heartbeat  can be seen on ultrasound. At the end of 12 weeks, all the baby's organs will be formed. Now that you are pregnant, you will want to do everything you can to have a healthy baby. Two of the most important things are to get good prenatal care and to follow your health care provider's instructions. Prenatal care is all the medical care you receive before the baby's birth. This care will help prevent, find, and treat any problems during the pregnancy and childbirth. Body changes during your first trimester Your body goes through many changes during pregnancy. The changes vary from woman to woman.  You may gain or lose a couple of pounds at first.  You may feel sick to your stomach (nauseous) and you may throw up (vomit). If the vomiting is uncontrollable, call your health care provider.  You may tire easily.  You may develop headaches that can be relieved by medicines. All medicines should be approved by your health care provider.  You may urinate more often. Painful urination may mean you have a bladder infection.  You may develop heartburn as a result of your pregnancy.  You may develop constipation because certain hormones are causing the muscles that push stool through your intestines to slow down.  You may develop hemorrhoids or swollen veins (varicose veins).  Your breasts may begin to grow larger and become tender.  Your nipples may stick out more, and the tissue that surrounds them (areola) may become darker.  Your gums may bleed and may be sensitive to brushing and flossing.  Dark spots or blotches (chloasma, mask of pregnancy) may develop on your face. This will likely fade after the baby is born.  Your menstrual periods will stop.  You may have a loss of appetite.  You may develop cravings for certain kinds of food.  You may have changes in your emotions from day to day, such as being excited to be pregnant or being concerned that something may go wrong with the pregnancy and  baby.  You may have more vivid and strange dreams.  You may have changes in your hair. These can include thickening of your hair, rapid growth, and changes in texture. Some women also have hair loss during or after pregnancy, or hair that feels dry or thin. Your hair will most likely return to normal after your baby is born.  What to expect at prenatal visits During a routine prenatal visit:  You will be weighed to make sure you and the baby are growing normally.  Your blood pressure will be taken.  Your abdomen will be measured to track your baby's growth.  The fetal heartbeat will be listened to between weeks 10 and 14 of your pregnancy.  Test results from any previous visits will be discussed.  Your health care provider may ask you:  How you are feeling.  If you are feeling the baby move.  If you have had any abnormal symptoms, such as leaking fluid, bleeding, severe headaches, or abdominal cramping.  If you are using any tobacco products, including cigarettes, chewing tobacco, and electronic cigarettes.  If you have any questions.  Other tests that may be performed during your first trimester include:  Blood tests to find your blood type and to check for the presence of any previous infections. The tests will also be used to check for low iron levels (anemia) and protein on red blood cells (Rh antibodies). Depending on your risk factors, or if you previously had diabetes during pregnancy, you may have tests to check for high blood sugar that affects pregnant women (gestational diabetes).  Urine tests to check for infections, diabetes, or protein in the urine.  An ultrasound to confirm the proper growth and development of the baby.  Fetal screens for spinal cord problems (spina bifida) and Down syndrome.  HIV (human immunodeficiency virus) testing. Routine prenatal testing includes screening for HIV, unless you choose not to have this test.  You may need other tests to  make sure you and the baby are doing well.  Follow these instructions at home: Medicines  Follow your health care provider's instructions regarding medicine use. Specific medicines may be either safe or unsafe to take during pregnancy.  Take a prenatal vitamin that contains at least 600 micrograms (mcg) of folic acid.  If you develop constipation, try taking a stool softener if your health care provider approves. Eating and drinking  Eat a balanced diet that includes fresh fruits and vegetables, whole grains, good sources of protein such as meat, eggs, or tofu, and low-fat dairy. Your health care provider will help you determine the amount of weight gain that is right for you.  Avoid raw meat and uncooked cheese. These carry germs that can cause birth defects in the baby.  Eating four or five small meals rather than three large meals a day may help relieve nausea and  vomiting. If you start to feel nauseous, eating a few soda crackers can be helpful. Drinking liquids between meals, instead of during meals, also seems to help ease nausea and vomiting.  Limit foods that are high in fat and processed sugars, such as fried and sweet foods.  To prevent constipation: ? Eat foods that are high in fiber, such as fresh fruits and vegetables, whole grains, and beans. ? Drink enough fluid to keep your urine clear or pale yellow. Activity  Exercise only as directed by your health care provider. Most women can continue their usual exercise routine during pregnancy. Try to exercise for 30 minutes at least 5 days a week. Exercising will help you: ? Control your weight. ? Stay in shape. ? Be prepared for labor and delivery.  Experiencing pain or cramping in the lower abdomen or lower back is a good sign that you should stop exercising. Check with your health care provider before continuing with normal exercises.  Try to avoid standing for long periods of time. Move your legs often if you must stand in  one place for a long time.  Avoid heavy lifting.  Wear low-heeled shoes and practice good posture.  You may continue to have sex unless your health care provider tells you not to. Relieving pain and discomfort  Wear a good support bra to relieve breast tenderness.  Take warm sitz baths to soothe any pain or discomfort caused by hemorrhoids. Use hemorrhoid cream if your health care provider approves.  Rest with your legs elevated if you have leg cramps or low back pain.  If you develop varicose veins in your legs, wear support hose. Elevate your feet for 15 minutes, 3-4 times a day. Limit salt in your diet. Prenatal care  Schedule your prenatal visits by the twelfth week of pregnancy. They are usually scheduled monthly at first, then more often in the last 2 months before delivery.  Write down your questions. Take them to your prenatal visits.  Keep all your prenatal visits as told by your health care provider. This is important. Safety  Wear your seat belt at all times when driving.  Make a list of emergency phone numbers, including numbers for family, friends, the hospital, and police and fire departments. General instructions  Ask your health care provider for a referral to a local prenatal education class. Begin classes no later than the beginning of month 6 of your pregnancy.  Ask for help if you have counseling or nutritional needs during pregnancy. Your health care provider can offer advice or refer you to specialists for help with various needs.  Do not use hot tubs, steam rooms, or saunas.  Do not douche or use tampons or scented sanitary pads.  Do not cross your legs for long periods of time.  Avoid cat litter boxes and soil used by cats. These carry germs that can cause birth defects in the baby and possibly loss of the fetus by miscarriage or stillbirth.  Avoid all smoking, herbs, alcohol, and medicines not prescribed by your health care provider. Chemicals in  these products affect the formation and growth of the baby.  Do not use any products that contain nicotine or tobacco, such as cigarettes and e-cigarettes. If you need help quitting, ask your health care provider. You may receive counseling support and other resources to help you quit.  Schedule a dentist appointment. At home, brush your teeth with a soft toothbrush and be gentle when you floss. Contact a health care  provider if:  You have dizziness.  You have mild pelvic cramps, pelvic pressure, or nagging pain in the abdominal area.  You have persistent nausea, vomiting, or diarrhea.  You have a bad smelling vaginal discharge.  You have pain when you urinate.  You notice increased swelling in your face, hands, legs, or ankles.  You are exposed to fifth disease or chickenpox.  You are exposed to [place] measles (rubella) and have never had it. Get help right away if:  You have a fever.  You are leaking fluid from your vagina.  You have spotting or bleeding from your vagina.  You have severe abdominal cramping or pain.  You have rapid weight gain or loss.  You vomit blood or material that looks like coffee grounds.  You develop a severe headache.  You have shortness of breath.  You have any kind of trauma, such as from a fall or a car accident. Summary  The first trimester of pregnancy is from week 1 until the end of week 13 (months 1 through 3).  Your body goes through many changes during pregnancy. The changes vary from woman to woman.  You will have routine prenatal visits. During those visits, your health care provider will examine you, discuss any test results you may have, and talk with you about how you are feeling. This information is not intended to replace advice given to you by your health care provider. Make sure you discuss any questions you have with your health care provider. Document Released: 01/25/2001 Document Revised: 01/13/2016 Document Reviewed:  01/13/2016 Elsevier Interactive Patient Education  2018 [employer]. Common Medications Safe in Pregnancy  Acne:      Constipation:  Benzoyl Peroxide     Colace  Clindamycin      Dulcolax Suppository  Topica Erythromycin     Fibercon  Salicylic Acid      Metamucil         Miralax AVOID:        Senakot   Accutane    Cough:  Retin-A       Cough Drops  Tetracycline      Phenergan w/ Codeine if Rx  Minocycline      Robitussin (Plain & DM)  Antibiotics:     Crabs/Lice:   Ceclor       RID  Cephalosporins    AVOID:  E-Mycins      Kwell  Keflex  Macrobid/Macrodantin   Diarrhea:  Penicillin      Kao-Pectate  Zithromax      Imodium AD         PUSH FLUIDS AVOID:       Cipro     Fever:  Tetracycline      Tylenol (Regular or Extra  Minocycline       Strength)  Levaquin      Extra Strength-Do not          Exceed 8 tabs/24 hrs Caffeine:        <280m/day (equiv. To 1 cup of coffee or  approx. 3 12 oz sodas)         Gas: Cold/Hayfever:       Gas-X  Benadryl      Mylicon  Claritin       Phazyme  **Claritin-D        Chlor-Trimeton    Headaches:  Dimetapp      ASA-Free Excedrin  Drixoral-Non-Drowsy     Cold Compress  Mucinex (Guaifenasin)     Tylenol (Regular or  Extra  Sudafed/Sudafed-12 Hour     Strength)  **Sudafed PE Pseudoephedrine   Tylenol Cold & Sinus     Vicks Vapor Rub  Zyrtec  **AVOID if Problems With Blood Pressure         Heartburn: Avoid lying down for at least 1 hour after meals  Aciphex      Maalox     Rash:  Milk of Magnesia     Benadryl    Mylanta       1% Hydrocortisone Cream  Pepcid  Pepcid Complete   Sleep Aids:  Prevacid      Ambien   Prilosec       Benadryl  Rolaids       Chamomile Tea  Tums (Limit 4/day)     Unisom  Zantac       Tylenol PM         Warm milk-add vanilla or  Hemorrhoids:       Sugar for taste  Anusol/Anusol H.C.  (RX: Analapram 2.5%)  Sugar Substitutes:  Hydrocortisone OTC     Ok in moderation  Preparation  H      Tucks        Vaseline lotion applied to tissue with wiping    Herpes:     Throat:  Acyclovir      Oragel  Famvir  Valtrex     Vaccines:         Flu Shot Leg Cramps:       *Gardasil  Benadryl      Hepatitis A         Hepatitis B Nasal Spray:       Pneumovax  Saline Nasal Spray     Polio Booster         Tetanus Nausea:       Tuberculosis test or PPD  Vitamin B6 25 mg TID   AVOID:    Dramamine      *Gardasil  Emetrol       Live Poliovirus  Ginger Root 250 mg QID    MMR (measles, mumps &  High Complex Carbs @ Bedtime    rebella)  Sea Bands-Accupressure    Varicella (Chickenpox)  Unisom 1/2 tab TID     *No known complications           If received before Pain:         Known pregnancy;   Darvocet       Resume series after  Lortab        Delivery  Percocet    Yeast:   Tramadol      Femstat  Tylenol 3      Gyne-lotrimin  Ultram       Monistat  Vicodin           MISC:         All Sunscreens           Hair Coloring/highlights          Insect Repellant's          (Including DEET)         Mystic Tans

## 2017-05-28 LAB — CBC WITH DIFFERENTIAL/PLATELET
BASOS ABS: 0 10*3/uL (ref 0.0–0.2)
Basos: 0 %
EOS (ABSOLUTE): 0.1 10*3/uL (ref 0.0–0.4)
Eos: 2 %
Hematocrit: 37.5 % (ref 34.0–46.6)
Hemoglobin: 12.9 g/dL (ref 11.1–15.9)
IMMATURE GRANULOCYTES: 0 %
Immature Grans (Abs): 0 10*3/uL (ref 0.0–0.1)
Lymphocytes Absolute: 3.1 10*3/uL (ref 0.7–3.1)
Lymphs: 50 %
MCH: 28.8 pg (ref 26.6–33.0)
MCHC: 34.4 g/dL (ref 31.5–35.7)
MCV: 84 fL (ref 79–97)
Monocytes Absolute: 0.6 10*3/uL (ref 0.1–0.9)
Monocytes: 9 %
NEUTROS PCT: 39 %
Neutrophils Absolute: 2.4 10*3/uL (ref 1.4–7.0)
PLATELETS: 307 10*3/uL (ref 150–379)
RBC: 4.48 x10E6/uL (ref 3.77–5.28)
RDW: 13.3 % (ref 12.3–15.4)
WBC: 6.2 10*3/uL (ref 3.4–10.8)

## 2017-05-28 LAB — HEMOGLOBIN A1C
ESTIMATED AVERAGE GLUCOSE: 123 mg/dL
HEMOGLOBIN A1C: 5.9 % — AB (ref 4.8–5.6)

## 2017-05-28 LAB — URINE CULTURE

## 2017-05-28 LAB — BETA HCG QUANT (REF LAB): hCG Quant: 81 m[IU]/mL

## 2017-05-29 LAB — CULTURE, GROUP A STREP

## 2017-05-31 ENCOUNTER — Inpatient Hospital Stay (HOSPITAL_COMMUNITY)
Admission: AD | Admit: 2017-05-31 | Discharge: 2017-05-31 | Disposition: A | Discharge status: Home / Self Care | Payer: BLUE CROSS/BLUE SHIELD | Source: Ambulatory Visit | Attending: Obstetrics and Gynecology | Admitting: Obstetrics and Gynecology

## 2017-05-31 ENCOUNTER — Inpatient Hospital Stay (HOSPITAL_COMMUNITY): Payer: BLUE CROSS/BLUE SHIELD

## 2017-05-31 ENCOUNTER — Encounter (HOSPITAL_COMMUNITY): Payer: Self-pay | Admitting: *Deleted

## 2017-05-31 ENCOUNTER — Other Ambulatory Visit: Payer: Self-pay

## 2017-05-31 DIAGNOSIS — O26899 Other specified pregnancy related conditions, unspecified trimester: Secondary | ICD-10-CM

## 2017-05-31 DIAGNOSIS — O26891 Other specified pregnancy related conditions, first trimester: Secondary | ICD-10-CM | POA: Diagnosis not present

## 2017-05-31 DIAGNOSIS — B373 Candidiasis of vulva and vagina: Secondary | ICD-10-CM | POA: Diagnosis not present

## 2017-05-31 DIAGNOSIS — O3680X0 Pregnancy with inconclusive fetal viability, not applicable or unspecified: Secondary | ICD-10-CM

## 2017-05-31 DIAGNOSIS — O98811 Other maternal infectious and parasitic diseases complicating pregnancy, first trimester: Secondary | ICD-10-CM | POA: Diagnosis not present

## 2017-05-31 DIAGNOSIS — O26851 Spotting complicating pregnancy, first trimester: Secondary | ICD-10-CM

## 2017-05-31 DIAGNOSIS — B3731 Acute candidiasis of vulva and vagina: Secondary | ICD-10-CM

## 2017-05-31 DIAGNOSIS — O283 Abnormal ultrasonic finding on antenatal screening of mother: Secondary | ICD-10-CM

## 2017-05-31 DIAGNOSIS — Z3A01 Less than 8 weeks gestation of pregnancy: Secondary | ICD-10-CM | POA: Insufficient documentation

## 2017-05-31 DIAGNOSIS — R109 Unspecified abdominal pain: Secondary | ICD-10-CM | POA: Diagnosis present

## 2017-05-31 LAB — WET PREP, GENITAL
Clue Cells Wet Prep HPF POC: NONE SEEN
Sperm: NONE SEEN
Trich, Wet Prep: NONE SEEN

## 2017-05-31 LAB — URINALYSIS, ROUTINE W REFLEX MICROSCOPIC
Bilirubin Urine: NEGATIVE
GLUCOSE, UA: NEGATIVE mg/dL
Ketones, ur: NEGATIVE mg/dL
NITRITE: NEGATIVE
Protein, ur: NEGATIVE mg/dL
SPECIFIC GRAVITY, URINE: 1.013 (ref 1.005–1.030)
pH: 6 (ref 5.0–8.0)

## 2017-05-31 LAB — HCG, QUANTITATIVE, PREGNANCY: HCG, BETA CHAIN, QUANT, S: 51 m[IU]/mL — AB (ref <5)

## 2017-05-31 MED ORDER — FLUCONAZOLE 150 MG PO TABS: 150.0000 mg | ORAL_TABLET | Freq: Once | ORAL | 0 refills | Status: AC

## 2017-05-31 NOTE — MAU Note (Signed)
Feeling more pain today.  Feels like period pain, but a little bit worse.   Pain is in lower abd and LLQ.  No bleeding.  Feels like something is wrong with her kidneys, "too much bubbles in urine" and pain in back., started this morning- comes and goes

## 2017-05-31 NOTE — MAU Provider Note (Signed)
History     CSN: [id number]  Arrival date and time: 05/31/17 1721   First Provider Initiated Contact with Patient 05/31/17 1928      Chief Complaint  Patient presents with  . Abdominal Pain   HPI Christy Jackson is a 32 y.o. G3P0020 at [redacted]w[redacted]d presenting with suprapubic abdominal pain of 3-4 days duration. She describes menstraul-like cramping intermittently. Last night she noted scant brown spotting. Pain radiates to low back and sometimes to LLQ and is not related to movement. Denies irritative vaginal discharge. She has not taken any analgesics. BLood type O pos.  She reported LLQ abdominal pain when seen at [facility] for pregnancy test on 05/27/2017. Quantitative beta hCG that day was 81, CBC normal, urine culture negative. She desires pregnancy and is anxious due to poor pregnancy history: SAB 7 months ago, ruptured tubal ectopic with right salpingectomy 5 months ago.   OB History  Gravida Para Term Preterm AB Living  3       2    SAB TAB Ectopic Multiple Live Births  1   1        # Outcome Date GA Lbr Len/2nd Weight Sex Delivery Anes PTL Lv  3 Current           2 Ectopic 01/16/17 [redacted]w[redacted]d    ECTOPIC        Birth Comments: Had laparoscopic right salpingectomy     Complications: Ruptured right tubal ectopic pregnancy causing hemoperitoneum  1 SAB              Past Medical History:  Diagnosis Date  . Medical history non-contributory     Past Surgical History:  Procedure Laterality Date  . LAPAROSCOPY N/A 01/16/2017   Procedure: LAPAROSCOPY OPERATIVE, RIGHT SALPINGECTOMY;  Surgeon: Tereso Newcomer, MD;  Location: [facility];  Service: Gynecology;  Laterality: N/A;    History reviewed. No pertinent family history.  Social History   Tobacco Use  . Smoking status: Never Smoker  . Smokeless tobacco: Never Used  Substance Use Topics  . Alcohol use: No  . Drug use: No    Allergies: No Known Allergies  Medications Prior to Admission  Medication Sig Dispense Refill  Last Dose  . ibuprofen (ADVIL,MOTRIN) 600 MG tablet Take 1 tablet (600 mg total) by mouth every 6 (six) hours as needed for headache, mild pain, moderate pain or cramping. 30 tablet 2 Taking    Review of Systems  Constitutional: Positive for fatigue. Negative for chills and fever.  HENT: Negative for congestion.   Gastrointestinal: Positive for abdominal pain. Negative for nausea.  Genitourinary: Positive for pelvic pain and vaginal bleeding. Negative for dysuria, flank pain, frequency and urgency.  Neurological: Negative for dizziness.   Physical Exam   Blood pressure 117/89, pulse 92, temperature 98.7 F (37.1 C), temperature source Oral, resp. rate 18, weight 200 lb 8 oz (90.9 kg), last menstrual period 04/28/2017, SpO2 98 %, unknown if currently breastfeeding.  Physical Exam  Nursing note and vitals reviewed. Constitutional: She appears well-developed and well-nourished. No distress.  HENT:  Head: Normocephalic.  Eyes: No scleral icterus.  Neck: Neck supple. No thyromegaly present.  Cardiovascular: Normal rate.  Respiratory: Effort normal.  GI: Soft. There is no tenderness.  Genitourinary: Vaginal discharge found.  Genitourinary Comments: SPec: NEFG; vagina with scant brown discharge in vault; cx withour lesion or active bleeding Bimanaual: cx thick closed; uterus ULNS, mobile, no adnexal tenderness or masses  Musculoskeletal: Normal range of motion.  Neurological: She  is alert.  Skin: Skin is warm and dry.    MAU Course  Procedures Results for orders placed or performed during the hospital encounter of 05/31/17 (from the past 24 hour(s))  Urinalysis, Routine w reflex microscopic     Status: Abnormal   Collection Time: 05/31/17  5:58 PM  Result Value Ref Range   Color, Urine YELLOW YELLOW   APPearance HAZY (A) CLEAR   Specific Gravity, Urine 1.013 1.005 - 1.030   pH 6.0 5.0 - 8.0   Glucose, UA NEGATIVE NEGATIVE mg/dL   Hgb urine dipstick MODERATE (A) NEGATIVE    Bilirubin Urine NEGATIVE NEGATIVE   Ketones, ur NEGATIVE NEGATIVE mg/dL   Protein, ur NEGATIVE NEGATIVE mg/dL   Nitrite NEGATIVE NEGATIVE   Leukocytes, UA MODERATE (A) NEGATIVE   RBC / HPF 0-5 0 - 5 RBC/hpf   WBC, UA 0-5 0 - 5 WBC/hpf   Bacteria, UA RARE (A) NONE SEEN   Squamous Epithelial / LPF 6-30 (A) NONE SEEN   Mucus PRESENT   Wet prep, genital     Status: Abnormal   Collection Time: 05/31/17  7:45 PM  Result Value Ref Range   Yeast Wet Prep HPF POC PRESENT (A) NONE SEEN   Trich, Wet Prep NONE SEEN NONE SEEN   Clue Cells Wet Prep HPF POC NONE SEEN NONE SEEN   WBC, Wet Prep HPF POC MODERATE (A) NONE SEEN   Sperm NONE SEEN   hCG, quantitative, pregnancy     Status: Abnormal   Collection Time: 05/31/17  7:56 PM  Result Value Ref Range   hCG, Beta Chain, Quant, S 51 (H) <5 mIU/mL  [place] Ob Comp Less 14 Wks  Result Date: 05/31/2017 CLINICAL DATA:  Initial evaluation for acute lower abdominal pain, back pain. Beta HCG = 51. EXAM: OBSTETRIC <14 WK US AND TRANSVAGINAL OB US TECHNIQUE: Both transabdominal and transvaginal ultrasound examinations were performed for complete evaluation of the gestation as well as the maternal uterus, adnexal regions, and pelvic cul-de-sac. Transvaginal technique was performed to assess early pregnancy. COMPARISON:  None. FINDINGS: Intrauterine gestational sac: None visualized. Endometrial stripe measures 6.1 mm in thickness. Yolk sac:  Negative. Embryo:  Negative. Cardiac Activity: N/A Heart Rate: N/A  bpm Subchorionic hemorrhage:  None visualized. Maternal uterus/adnexae: Ovaries are normal in appearance bilaterally. No adnexal mass. No free fluid within the pelvis. IMPRESSION: 1. Early pregnancy with no discrete IUP or adnexal mass identified. Finding is consistent with a pregnancy of unknown anatomic location at this time. Differential considerations include IUP to early to visualize, recent SAB, or possibly occult ectopic pregnancy. Close clinical monitoring with  serial beta HCGs and close interval follow-up ultrasound recommended as clinically warranted. 2. No other acute maternal uterine or adnexal abnormality identified. Electronically Signed   By: Rise MuBenjamin  McClintock M.D.   On: 05/31/2017 21:12   [place] Ob Transvaginal  Result Date: 05/31/2017 CLINICAL DATA:  Initial evaluation for acute lower abdominal pain, back pain. Beta HCG = 51. EXAM: OBSTETRIC <14 WK US AND TRANSVAGINAL OB US TECHNIQUE: Both transabdominal and transvaginal ultrasound examinations were performed for complete evaluation of the gestation as well as the maternal uterus, adnexal regions, and pelvic cul-de-sac. Transvaginal technique was performed to assess early pregnancy. COMPARISON:  None. FINDINGS: Intrauterine gestational sac: None visualized. Endometrial stripe measures 6.1 mm in thickness. Yolk sac:  Negative. Embryo:  Negative. Cardiac Activity: N/A Heart Rate: N/A  bpm Subchorionic hemorrhage:  None visualized. Maternal uterus/adnexae: Ovaries are normal in appearance bilaterally. No adnexal  mass. No free fluid within the pelvis. IMPRESSION: 1. Early pregnancy with no discrete IUP or adnexal mass identified. Finding is consistent with a pregnancy of unknown anatomic location at this time. Differential considerations include IUP to early to visualize, recent SAB, or possibly occult ectopic pregnancy. Close clinical monitoring with serial beta HCGs and close interval follow-up ultrasound recommended as clinically warranted. 2. No other acute maternal uterine or adnexal abnormality identified. Electronically Signed   By: Rise Mu M.D.   On: 05/31/2017 21:12     MDM Explained to patient that quant low for 5 wks and falling quant is consistent with EPF. Pregnancy location unknown so ectopic precautions given. Stable with minimal pain and spotting. Will F/U quant at [facility] in 1 wk.  Yeast vaginitis prevenetion and treatment reviewed.   Assessment and Plan   1. Abdominal pain  during pregnancy in first trimester   2. Abdominal pain affecting pregnancy   3. Spotting affecting pregnancy in first trimester   4. Pregnancy, location unknown   5. Yeast vaginitis    Allergies as of 05/31/2017   No Known Allergies     Medication List    STOP taking these medications   ibuprofen 600 MG tablet Commonly known as:  ADVIL,MOTRIN     TAKE these medications   fluconazole 150 MG tablet Commonly known as:  DIFLUCAN Take 1 tablet (150 mg total) by mouth once for 1 dose.      Follow-up Information    [facility] Follow up on 06/07/2017.   Specialty:  Obstetrics and Gynecology Why:  Follow up lab test Contact information: [address] [place] [address] [phone]          Vaani Morren CNM 05/31/2017, 7:28 PM

## 2017-05-31 NOTE — Discharge Instructions (Signed)
Abdominal Pain During Pregnancy Abdominal pain is common in pregnancy. Most of the time, it does not cause harm. There are many causes of abdominal pain. Some causes are more serious than others and sometimes the cause is not known. Abdominal pain can be a sign that something is very wrong with the pregnancy or the pain may have nothing to do with the pregnancy. Always tell your health care provider if you have any abdominal pain. Follow these instructions at home:  Do not have sex or put anything in your vagina until your symptoms go away completely.  Watch your abdominal pain for any changes.  Get plenty of rest until your pain improves.  Drink enough fluid to keep your urine clear or pale yellow.  Take over-the-counter or prescription medicines only as told by your health care provider.  Keep all follow-up visits as told by your health care provider. This is important. Contact a health care provider if:  You have a fever.  Your pain gets worse or you have cramping.  Your pain continues after resting. Get help right away if:  You are bleeding, leaking fluid, or passing tissue from the vagina.  You have vomiting or diarrhea that does not go away.  You have painful or bloody urination.  You notice a decrease in your baby's movements.  You feel very weak or faint.  You have shortness of breath.  You develop a severe headache with abdominal pain.  You have abnormal vaginal discharge with abdominal pain. This information is not intended to replace advice given to you by your health care provider. Make sure you discuss any questions you have with your health care provider. Document Released: 01/31/2005 Document Revised: 11/12/2015 Document Reviewed: 08/30/2012 Elsevier Interactive Patient Education  2018 [employer]  Vaginal Yeast infection, Adult Vaginal yeast infection is a condition that causes soreness, swelling, and redness (inflammation) of the vagina. It also causes  vaginal discharge. This is a common condition. Some women get this infection frequently. What are the causes? This condition is caused by a change in the normal balance of the yeast (candida) and bacteria that live in the vagina. This change causes an overgrowth of yeast, which causes the inflammation. What increases the risk? This condition is more likely to develop in:  Women who take antibiotic medicines.  Women who have diabetes.  Women who take birth control pills.  Women who are pregnant.  Women who douche often.  Women who have a weak defense (immune) system.  Women who have been taking steroid medicines for a long time.  Women who frequently wear tight clothing.  What are the signs or symptoms? Symptoms of this condition include:  White, thick vaginal discharge.  Swelling, itching, redness, and irritation of the vagina. The lips of the vagina (vulva) may be affected as well.  Pain or a burning feeling while urinating.  Pain during sex.  How is this diagnosed? This condition is diagnosed with a medical history and physical exam. This will include a pelvic exam. Your health care provider will examine a sample of your vaginal discharge under a microscope. Your health care provider may send this sample for testing to confirm the diagnosis. How is this treated? This condition is treated with medicine. Medicines may be over-the-counter or prescription. You may be told to use one or more of the following:  Medicine that is taken orally.  Medicine that is applied as a cream.  Medicine that is inserted directly into the vagina (suppository).  Follow these instructions at home:  Take or apply over-the-counter and prescription medicines only as told by your health care provider.  Do not have sex until your health care provider has approved. Tell your sex partner that you have a yeast infection. That person should go to his or her health care provider if he or she develops  symptoms.  Do not wear tight clothes, such as pantyhose or tight pants.  Avoid using tampons until your health care provider approves.  Eat more yogurt. This may help to keep your yeast infection from returning.  Try taking a sitz bath to help with discomfort. This is a warm water bath that is taken while you are sitting down. The water should only come up to your hips and should cover your buttocks. Do this 3-4 times per day or as told by your health care provider.  Do not douche.  Wear breathable, cotton underwear.  If you have diabetes, keep your blood sugar levels under control. Contact a health care provider if:  You have a fever.  Your symptoms go away and then return.  Your symptoms do not get better with treatment.  Your symptoms get worse.  You have new symptoms.  You develop blisters in or around your vagina.  You have blood coming from your vagina and it is not your menstrual period.  You develop pain in your abdomen. This information is not intended to replace advice given to you by your health care provider. Make sure you discuss any questions you have with your health care provider. Document Released: 11/10/2004 Document Revised: 07/15/2015 Document Reviewed: 08/04/2014 Elsevier Interactive Patient Education  2018 [employer].  Ectopic Pregnancy An ectopic pregnancy is when the fertilized egg attaches (implants) outside the uterus. Most ectopic pregnancies occur in one of the tubes where eggs travel from the ovary to the uterus (fallopian tubes), but the implanting can occur in other locations. In rare cases, ectopic pregnancies occur on the ovary, intestine, pelvis, abdomen, or cervix. In an ectopic pregnancy, the fertilized egg does not have the ability to develop into a normal, healthy baby. A ruptured ectopic pregnancy is one in which tearing or bursting of a fallopian tube causes internal bleeding. Often, there is intense lower abdominal pain, and vaginal  bleeding sometimes occurs. Having an ectopic pregnancy can be life-threatening. If this dangerous condition is not treated, it can lead to blood loss, shock, or even death. What are the causes? The most common cause of this condition is damage to one of the fallopian tubes. A fallopian tube may be narrowed or blocked, and that keeps the fertilized egg from reaching the uterus. What increases the risk? This condition is more likely to develop in women of childbearing age who have different levels of risk. The levels of risk can be divided into three categories. High risk  You have gone through infertility treatment.  You have had an ectopic pregnancy before.  You have had surgery on the fallopian tubes, or another surgical procedure, such as an abortion.  You have had surgery to have the fallopian tubes tied (tubal ligation).  You have problems or diseases of the fallopian tubes.  You have been exposed to diethylstilbestrol (DES). This medicine was used until 1971, and it had effects on babies whose mothers took the medicine.  You become pregnant while using an IUD (intrauterine device) for birth control. Moderate risk  You have a history of infertility.  You have had an STI (sexually transmitted infection).  You have a history of pelvic inflammatory disease (PID).  You have scarring from endometriosis.  You have multiple sexual partners.  You smoke. Low risk  You have had pelvic surgery.  You use vaginal douches.  You became sexually active before age 28. What are the signs or symptoms? Common symptoms of this condition include normal pregnancy symptoms, such as missing a period, nausea, tiredness, abdominal pain, breast tenderness, and bleeding. However, ectopic pregnancy will have additional symptoms, such as:  Pain with intercourse.  Irregular vaginal bleeding or spotting.  Cramping or pain on one side or in the lower abdomen.  Fast heartbeat, low blood pressure,  and sweating.  Passing out while having a bowel movement.  Symptoms of a ruptured ectopic pregnancy and internal bleeding may include:  Sudden, severe pain in the abdomen and pelvis.  Dizziness, weakness, light-headedness, or fainting.  Pain in the shoulder or neck area.  How is this diagnosed? This condition is diagnosed by:  A pelvic exam to locate pain or a mass in the abdomen.  A pregnancy test. This blood test checks for the presence as well as the specific level of pregnancy hormone in the bloodstream.  Ultrasound. This is performed if a pregnancy test is positive. In this test, a probe is inserted into the vagina. The probe will detect a fetus, possibly in a location other than the uterus.  Taking a sample of uterus tissue (dilation and curettage, or D&C).  Surgery to perform a visual exam of the inside of the abdomen using a thin, lighted tube that has a tiny camera on the end (laparoscope).  Culdocentesis. This procedure involves inserting a needle at the top of the vagina, behind the uterus. If blood is present in this area, it may indicate that a fallopian tube is torn.  How is this treated? This condition is treated with medicine or surgery. Medicine  An injection of a medicine (methotrexate) may be given to cause the pregnancy tissue to be absorbed. This medicine may save your fallopian tube. It may be given if: ? The diagnosis is made early, with no signs of active bleeding. ? The fallopian tube has not ruptured. ? You are considered to be a good candidate for the medicine. Usually, pregnancy hormone blood levels are checked after methotrexate treatment. This is to be sure that the medicine is effective. It may take 4-6 weeks for the pregnancy to be absorbed. Most pregnancies will be absorbed by 3 weeks. Surgery  A laparoscope may be used to remove the pregnancy tissue.  If severe internal bleeding occurs, a larger cut (incision) may be made in the lower abdomen  (laparotomy) to remove the fetus and placenta. This is done to stop the bleeding.  Part or all of the fallopian tube may be removed (salpingectomy) along with the fetus and placenta. The fallopian tube may also be repaired during the surgery.  In very rare circumstances, removal of the uterus (hysterectomy) may be required.  After surgery, pregnancy hormone testing may be done to be sure that there is no pregnancy tissue left. Whether your treatment is medicine or surgery, you may receive a Rho (D) immune globulin shot to prevent problems with any future pregnancy. This shot may be given if:  You are Rh-negative and the baby's father is Rh-positive.  You are Rh-negative and you do not know the Rh type of the baby's father.  Follow these instructions at home:  Rest and limit your activity after the procedure for as long  as told by your health care provider.  Until your health care provider says that it is safe: ? Do not lift anything that is heavier than 10 lb (4.5 kg), or the limit that your health care provider tells you. ? Avoid physical exercise and any movement that requires effort (is strenuous).  To help prevent constipation: ? Eat a healthy diet that includes fruits, vegetables, and whole grains. ? Drink 6-8 glasses of water per day. Get help right away if:  You develop worsening pain that is not relieved by medicine.  You have: ? A fever or chills. ? Vaginal bleeding. ? Redness and swelling at the incision site. ? Nausea and vomiting.  You feel dizzy or weak.  You feel light-headed or you faint. This information is not intended to replace advice given to you by your health care provider. Make sure you discuss any questions you have with your health care provider. Document Released: 03/10/2004 Document Revised: 09/30/2015 Document Reviewed: 09/02/2015 Elsevier Interactive Patient Education  [employer].

## 2017-06-01 LAB — HIV ANTIBODY (ROUTINE TESTING W REFLEX): HIV Screen 4th Generation wRfx: NONREACTIVE

## 2017-06-01 LAB — GC/CHLAMYDIA PROBE AMP (~~LOC~~) NOT AT ARMC
CHLAMYDIA, DNA PROBE: NEGATIVE
Neisseria Gonorrhea: NEGATIVE

## 2017-06-07 ENCOUNTER — Ambulatory Visit: Payer: BLUE CROSS/BLUE SHIELD | Admitting: *Deleted

## 2017-06-07 DIAGNOSIS — O039 Complete or unspecified spontaneous abortion without complication: Secondary | ICD-10-CM

## 2017-06-08 LAB — BETA HCG QUANT (REF LAB): HCG QUANT: 5 m[IU]/mL

## 2017-06-12 ENCOUNTER — Telehealth: Payer: Self-pay

## 2017-06-12 NOTE — Telephone Encounter (Addendum)
-----   Message from Marny Lowenstein, PA-C sent at 06/08/2017  1:12 PM EDT ----- Complete SAB. Please inform patient and set up follow-up to see any provider in 1-2 weeks. Thanks  Marny Lowenstein, PA-C 06/08/2017 1:11 PM   LM for pt to return call in regards to scheduling an appt.

## 2017-06-19 NOTE — Telephone Encounter (Signed)
LM for pt that this is our second attempt in trying to reach her if she could please give the office a call to schedule a f/u appt.  Letter sent.

## 2017-06-29 ENCOUNTER — Ambulatory Visit (INDEPENDENT_AMBULATORY_CARE_PROVIDER_SITE_OTHER): Payer: BLUE CROSS/BLUE SHIELD | Admitting: Obstetrics & Gynecology

## 2017-06-29 ENCOUNTER — Encounter: Payer: Self-pay | Admitting: Obstetrics & Gynecology

## 2017-06-29 VITALS — BP 121/73 | HR 76 | Wt 201.5 lb

## 2017-06-29 DIAGNOSIS — N96 Recurrent pregnancy loss: Secondary | ICD-10-CM

## 2017-06-29 MED ORDER — PROGESTERONE MICRONIZED 200 MG PO CAPS: 200.0000 mg | ORAL_CAPSULE | Freq: Two times a day (BID) | ORAL | 3 refills | Status: DC

## 2017-06-29 MED ORDER — FOLIC ACID 1 MG PO TABS: 1.0000 mg | ORAL_TABLET | Freq: Every day | ORAL | 10 refills | Status: DC

## 2017-06-29 NOTE — Progress Notes (Signed)
Genetic Counseling scheduled for 07/04/17 @ 1000.  Pt also given contact info to [facility] [employer] [facility].  Referral to [facility] faxed.

## 2017-06-29 NOTE — Progress Notes (Signed)
   GYNECOLOGY OFFICE VISIT NOTE  History:  32 y.o. G3P0030 here today for discussion about recent SAB. Has history of two SABs and one ectopic pregnancy. Negative thrombophilia evaluation. Interested in genetic analysis and other evaluation. She denies any abnormal vaginal discharge, bleeding, pelvic pain or other concerns.   Past Medical History:  Diagnosis Date  . Medical history non-contributory     Past Surgical History:  Procedure Laterality Date  . LAPAROSCOPY N/A 01/16/2017   Procedure: LAPAROSCOPY OPERATIVE, RIGHT SALPINGECTOMY;  Surgeon: Tereso Newcomer, MD;  Location: [facility];  Service: Gynecology;  Laterality: N/A;    The following portions of the patient's history were reviewed and updated as appropriate: allergies, current medications, past family history, past medical history, past social history, past surgical history and problem list.     Review of Systems:  Pertinent items noted in HPI and remainder of comprehensive ROS otherwise negative.  Objective:  Physical Exam BP 121/73 (BP Location: Left Arm, Patient Position: Sitting, Cuff Size: Normal)   Pulse 76   Wt 201 lb 8 oz (91.4 kg)   LMP 04/24/2017 (Exact Date)   Breastfeeding? No   BMI 33.57 kg/m  CONSTITUTIONAL: Well-developed, well-nourished female in no acute distress.  SKIN: Skin is warm and dry. No rash noted. Not diaphoretic. No erythema. No pallor. NEUROLOGIC: Alert and oriented to person, place, and time. Normal reflexes, muscle tone coordination. No cranial nerve deficit noted. PSYCHIATRIC: Normal mood and affect. Normal behavior. Normal judgment and thought content. CARDIOVASCULAR: Normal heart rate noted RESPIRATORY: Effort and breath sounds normal, no problems with respiration noted ABDOMEN: Soft, no distention noted.   PELVIC: Deferred MUSCULOSKELETAL: Normal range of motion. No edema noted.    Assessment & Plan:  1. History of recurrent miscarriages Had a long discussion about further  evaluation and management.  Recommended taking a MVI and extra folic acid for now, discussed that she will be referred to genetics for possible carrier testing/karyotype. Will also need REI evaluation to see if there is any other testing recommended or other interventions.  She wanted to know about prophylactic progesterone, she was told that this could be tried, and it was prescribed. Another intervention that can be tried (barring other negative evaluation) would be prophylactic anticoagulation with Lovenox.  Will follow up results.  - folic acid (FOLVITE) 1 MG tablet; Take 1 tablet (1 mg total) by mouth daily.  Dispense: 30 tablet; Refill: 10 - Ambulatory referral to Infertility - Ambulatory referral to Genetics - progesterone (PROMETRIUM) 200 MG capsule; Take 1 capsule (200 mg total) by mouth 2 (two) times daily.  Dispense: 60 capsule; Refill: 3  Please refer to After Visit Summary for other counseling recommendations.   Return for any GYN concerns.   Total face-to-face time with patient: 15 minutes.  Over 50% of encounter was spent on counseling and coordination of care.   Jaynie Collins, MD, FACOG Obstetrician & Gynecologist, [facility] for [employer], [facility]

## 2017-07-04 ENCOUNTER — Ambulatory Visit (HOSPITAL_COMMUNITY): Payer: BLUE CROSS/BLUE SHIELD

## 2017-07-21 ENCOUNTER — Ambulatory Visit (HOSPITAL_COMMUNITY)
Admission: RE | Admit: 2017-07-21 | Discharge: 2017-07-21 | Disposition: A | Discharge status: Home / Self Care | Payer: BLUE CROSS/BLUE SHIELD | Source: Ambulatory Visit | Attending: Obstetrics & Gynecology | Admitting: Obstetrics & Gynecology

## 2017-07-21 DIAGNOSIS — O2620 Pregnancy care for patient with recurrent pregnancy loss, unspecified trimester: Secondary | ICD-10-CM | POA: Diagnosis not present

## 2017-07-21 DIAGNOSIS — N96 Recurrent pregnancy loss: Secondary | ICD-10-CM

## 2017-07-21 LAB — ROUTINE CHROMOSOME - KARYOTYPE

## 2017-07-21 NOTE — Progress Notes (Signed)
Genetic Counseling  High-Risk Gestation Note  Appointment Date:  07/21/2017 Referred By: Osborne Oman, MD Date of Birth:  1985/06/03 Partner:  Pleas Koch   Pregnancy History: [id number] Attending: Renella Cunas, MD  I met with Ms. Obera Donahoo and her husband, Mr. Pleas Koch, for genetic counseling because of the couple's history of recurrent pregnancy loss.   In summary:  Discussed history of two spontaneous pregnancy losses and one ectopic pregnancy  Reviewed various etiologies and also possibility of no explanation being determined  Previous thrombophilia workup through OB/GYN provider within normal limits  Discussed options of screening / testing  Peripheral blood karyotype analysis- drawn today for both Ms. [place] Ceasar Lund and Mr. Josephina Gip  Expanded pan-ethnic carrier screening- drawn today for patient ([facility])   We began by reviewing the family history in detail. The couple has had three pregnancies together; their first and third pregnancies resulted in spontaneous pregnancy loss at approximately less than 8 weeks and 4 weeks, respectively. The couple's second pregnancy was an ectopic pregnancy. Ms. Cleo Santucci is 32 y.o., and Mr. Josephina Gip is 32 years old. They do not have additional pregnancies with other partners. There are no additional known relatives with recurrent pregnancy loss. Ms. Donalson reported one brother with Down syndrome (unspecified type). He is currently 32 years old and lives with their parents in [place]. The family histories were otherwise found to be noncontributory for birth defects, intellectual disability, and known genetic conditions. Both the patient and her husband are [place], and the couple has no known consanguinity. Without further information regarding the provided family history, an accurate genetic risk cannot be calculated. Further genetic counseling is warranted if more information is obtained.  Regarding the family  history of Down syndrome, we discussed that ~ 95% of cases of Down syndrome are not inherited and are the result of non-disjunction.  Three to 4% of cases of Down syndrome are the result of a translocation involving chromosome #21.  We discussed the option of chromosome analysis to determine if an individual is a carrier of a balanced translocation involving chromosome #21.  If an individual carries a balanced translocation involving chromosome #21, then the chance to have a baby with Down syndrome would be greater than the maternal age-related risk.  We discussed that the patient's brother's Down syndrome is most likely due to nondisjunction. However, additional information regarding his karyotype and/or Ms. Spieler's karyotype would better refine recurrence risk for the patient's offspring. We discussed that karyotype analysis is indicated for Ms. Blue given her personal history of recurrent pregnancy loss.   We discussed that approximately 1 in 6 confirmed pregnancies results in miscarriage. A single underlying cause is more likely to be suspected when a couple has experienced 2-3 or more losses. It is less likely that there will be an identifiable single underlying cause when a couple has experienced less than 3 losses. Ms. Teegan Guinther previously had thrombophilia workup through her OB/GYN provider, which was within normal limits. Ms. Henly reported no personal history of medical problems. We focused our discussion today on possible genetic associations with recurrent pregnancy loss.   We spent time reviewing genes, chromosomes, and examples of chromosome conditions. We discussed that overall fetal chromosome abnormalities are estimated to account for 50% of early pregnancy loss. We discussed that fetal chromosome abnormalities can be sporadic, due to nondisjunction or can be inherited from a parent carrying a balanced chromosome rearrangement. They understand that testing at this time would not  account for  possible sporadic chromosome imbalances in prior (or future pregnancies).  However, in approximately 3-8% of couples with recurrent pregnancy loss, one partner carries a chromosome variant, such as a balanced translocation. Being a carrier of a chromosome variant can increase the risk for abnormalities in the sperm or egg cell, which can increase the risk for miscarriage or the birth of a child with birth defects and/or intellectual disability. We discussed the option of peripheral blood karyotype analysis for the patient and her partner today. We briefly discussed the availability of preimplantation genetic testing for aneuploidy (PGT-A) for future pregnancies.   After careful consideration, Ms. Mona Ceasar Lund and Mr. Pleas Koch both elected to pursue peripheral blood chromosome analysis (karyotype) today (through [facility]).   Regarding inherited single gene conditions, we discussed the option of expanded pan-ethnic carrier screening. We discussed that each person is estimated to have 7-10 genes that do not work correctly, meaning that each person is estimated to be a carrier of 7-10 different genetic conditions. They were counseled that the majority of these conditions follow autosomal recessive inheritance. We reviewed that we each have two copies of all of our genes, one inherited from each parent. If one copy of the pair of genes is changed in a way that causes it not to function properly, but the other copy of the gene works, then a person is called a "carrier" for that condition. However, because the other copy of the gene works correctly, the person typically does not have any health problems related to the non-working gene(s). It is only when BOTH copies of the pair of genes do not work correctly that a person will have the condition and the related health problems.  There are a myriad of genetic disorders that occur more frequently in specific ethnic  groups, those which can be traced to particular geographic locations. We discussed that although these genetic disorders are much more prevalent in specific ethnic groups, as families are becoming increasingly multiracial and multicultural, these conditions can occur in anyone from any race or ethnicity. For this reason, we discussed the availability of ethnic specific genetic carrier screening, professional society (ACOG) recommended carrier testing, and pan-ethnic carrier screening.   We reviewed that ACOG currently recommends that all patients be offered carrier screening for cystic fibrosis, spinal muscular atrophy and hemoglobinopathies. In addition, they were counseled that there are a variety of genetic screening laboratories that have pan-ethnic, or expanded, carrier screening panels, which evaluate carrier status for a wide range of genetic conditions. Some of these conditions are severe and actionable, but also rare; others occur more commonly, but are less severe. We discussed that testing options range from screening for a single condition to panels of more than 200 autosomal or X-linked genetic conditions. We reviewed that the prevalence of each condition varies (and often varies with ethnicity). Thus the couples' background risk to be a carrier for each of these various conditions would range, and in some cases be very low or unknown. Similarly, the detection rate varies with each condition and also varies in some cases with ethnicity, ranging from greater than 99% (in the case of hemoglobinopathies) to unknown. We reviewed that a negative carrier screen would thus reduce, but not eliminate the chance to be a carrier for these conditions. For some conditions included on specific pan-ethnic carrier screening panels, the pre-test carrier frequency and/or the detection rate is unknown. Thus, for some conditions, the exact reduction of risk with a negative carrier screening  result may not be able to be  quantified. We reviewed that in the event that one partner is found to be a carrier for one or more conditions, carrier screening would be available to the partner for those conditions. We also discussed that carrier status for the majority of conditions on the screen are not associated with medical implications for the carrier; however, there are some conditions for which carrier status would increase the chance of medical implications for the carrier.  We discussed the risks, benefits, and limitations of carrier screening with the couple. We discussed the possible results that the tests might provide including: positive, negative, unanticipated, and no result. Finally, they were counseled regarding the cost of each option and potential out of pocket expenses. After thoughtful consideration of their options, Ms. Shamona Tanton elected to pursue expanded carrier, which was drawn today to be performed through [facility] This lab's F[facility] panel screens for carrier status of 176 hereditary disorders. We discussed that results will be available in approximately 2 weeks.   I counseled this couple regarding the above risks and available options.  The approximate face-to-face time with the genetic counselor was 45 minutes.  Chipper Oman, MS Certified Genetic Counselor 07/21/2017

## 2017-08-04 ENCOUNTER — Telehealth (HOSPITAL_COMMUNITY): Payer: Self-pay

## 2017-08-04 NOTE — Telephone Encounter (Signed)
Called Christy Jackson to discuss her and her husband's peripheral blood chromosome results. This couple had peripheral blood chromosome analysis due to a history of recurrent unexplained miscarriages. The patient was identified by name and DOB.  We reviewed that the her results are normal 46,XX and her husband's results are also normal, 46,XY. We discussed that this indicates that there are no detectable rearrangements (translocations), insertions, deletions, or duplications, that explain the previous pregnancy losses. We again reviewed the variety of causes of recurrent pregnancy losses. The patient also had expanded carrier screening. The results are still pending.  All questions were answered to her satisfaction, she was encouraged to call with additional questions or concerns.  Donald Prosehristy S. Letita Prentiss, MS Certified Genetic Counselor

## 2017-08-07 ENCOUNTER — Telehealth (HOSPITAL_COMMUNITY): Payer: Self-pay | Admitting: MS"

## 2017-08-07 NOTE — Telephone Encounter (Signed)
Patient returned call. Called Ms. Camry Patman to discuss her carrier screening results. Mrs. Verdone had expanded carrier screening through [facility] The patient was identified by name and DOB. We reviewed that the results are negative for all of the conditions for which analysis was performed, with the exception of Isovaleric Acidemia.  This indicates that she does not have a detectable gene alteration in any of the genes for which analysis was performed, with the exception of IVD.  We discussed this condition briefly.   She was counseled that Isovaleric Acidemia is an organic acid disorder and symptoms are caused by buildup of isovaleric acidemia. The two forms described include a form that appears shortly after birth and is rapidly life-threatening, and the other which may appear later in childhood as episodes of illness. 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 We discussed that this condition is not expected to be related to the couple's history of recurrent pregnancy loss, according to available information in the medical literature.   We reviewed that this condition is autosomal recessive, and that carrier screening is available for her partner, if desired.  Ms. Sia declined pursuing carrier screening for her partner at this time but understands that this available at any time in the future, if desired. Based on the carrier frequency of this condition, the chance for him to be a carrier is approximately 1 in 260.  Thus, the overall chance to have a child with Isovaleric Acidemia is approximately 1 in 1040 prior to carrier screening for her husband.   She was counseled that we can send a screening kit to them or he can come to the office to have his blood drawn. Ms. Locascio declined pursuing carrier screening for her husband at this time.   We reviewed that  carrier screening does not detect all carriers of all of these conditions, but a normal result significantly decreases the likelihood of being a carrier, and therefore, the overall reproductive risk. We reviewed that [facility] sequences most of the genes, which is associated with a high detection rate for carriers, thus a negative screen is very reassuring. All questions were answered to her satisfaction, she was encouraged to call with additional questions or concerns. ? Chipper Oman, MS Certified Genetic Counselor 08/07/2017 2:09 PM

## 2017-08-07 NOTE — Telephone Encounter (Signed)
Left message for patient to return call regarding results of expanded carrier screening panel ([facility]).   Christy Jackson  08/07/2017 11:18 AM

## 2017-08-18 ENCOUNTER — Other Ambulatory Visit (HOSPITAL_COMMUNITY): Payer: Self-pay

## 2017-10-17 ENCOUNTER — Other Ambulatory Visit: Payer: Self-pay | Admitting: Obstetrics & Gynecology

## 2017-12-21 ENCOUNTER — Encounter: Payer: Self-pay | Admitting: Physician Assistant

## 2017-12-21 ENCOUNTER — Inpatient Hospital Stay (HOSPITAL_COMMUNITY)
Admission: AD | Admit: 2017-12-21 | Discharge: 2017-12-21 | Disposition: A | Discharge status: Home / Self Care | Payer: BLUE CROSS/BLUE SHIELD | Source: Ambulatory Visit | Attending: Obstetrics and Gynecology | Admitting: Obstetrics and Gynecology

## 2017-12-21 ENCOUNTER — Inpatient Hospital Stay (HOSPITAL_COMMUNITY): Payer: BLUE CROSS/BLUE SHIELD

## 2017-12-21 ENCOUNTER — Encounter (HOSPITAL_COMMUNITY): Payer: Self-pay | Admitting: *Deleted

## 2017-12-21 ENCOUNTER — Ambulatory Visit: Payer: BLUE CROSS/BLUE SHIELD | Admitting: Physician Assistant

## 2017-12-21 ENCOUNTER — Encounter (HOSPITAL_COMMUNITY): Payer: Self-pay

## 2017-12-21 VITALS — BP 108/78 | HR 90 | Temp 98.2°F | Resp 16 | Ht 66.0 in

## 2017-12-21 DIAGNOSIS — O3680X0 Pregnancy with inconclusive fetal viability, not applicable or unspecified: Secondary | ICD-10-CM

## 2017-12-21 DIAGNOSIS — Z3A01 Less than 8 weeks gestation of pregnancy: Secondary | ICD-10-CM | POA: Insufficient documentation

## 2017-12-21 DIAGNOSIS — N939 Abnormal uterine and vaginal bleeding, unspecified: Secondary | ICD-10-CM | POA: Diagnosis not present

## 2017-12-21 DIAGNOSIS — O209 Hemorrhage in early pregnancy, unspecified: Secondary | ICD-10-CM

## 2017-12-21 DIAGNOSIS — B373 Candidiasis of vulva and vagina: Secondary | ICD-10-CM

## 2017-12-21 DIAGNOSIS — O208 Other hemorrhage in early pregnancy: Secondary | ICD-10-CM

## 2017-12-21 DIAGNOSIS — Z3201 Encounter for pregnancy test, result positive: Secondary | ICD-10-CM | POA: Diagnosis not present

## 2017-12-21 DIAGNOSIS — O26851 Spotting complicating pregnancy, first trimester: Secondary | ICD-10-CM | POA: Diagnosis not present

## 2017-12-21 DIAGNOSIS — B3731 Acute candidiasis of vulva and vagina: Secondary | ICD-10-CM

## 2017-12-21 LAB — URINALYSIS, ROUTINE W REFLEX MICROSCOPIC
BILIRUBIN URINE: NEGATIVE
Glucose, UA: NEGATIVE mg/dL
KETONES UR: NEGATIVE mg/dL
NITRITE: NEGATIVE
Protein, ur: NEGATIVE mg/dL
Specific Gravity, Urine: 1.009 (ref 1.005–1.030)
pH: 5 (ref 5.0–8.0)

## 2017-12-21 LAB — CBC
HEMATOCRIT: 38.1 % (ref 36.0–46.0)
Hemoglobin: 12.9 g/dL (ref 12.0–15.0)
MCH: 28.9 pg (ref 26.0–34.0)
MCHC: 33.9 g/dL (ref 30.0–36.0)
MCV: 85.4 fL (ref 80.0–100.0)
NRBC: 0 % (ref 0.0–0.2)
Platelets: 264 10*3/uL (ref 150–400)
RBC: 4.46 MIL/uL (ref 3.87–5.11)
RDW: 12.9 % (ref 11.5–15.5)
WBC: 8.4 10*3/uL (ref 4.0–10.5)

## 2017-12-21 LAB — POCT WET + KOH PREP: Trich by wet prep: ABSENT

## 2017-12-21 LAB — HCG, QUANTITATIVE, PREGNANCY: HCG, BETA CHAIN, QUANT, S: 643 m[IU]/mL — AB (ref <5)

## 2017-12-21 LAB — POCT URINE PREGNANCY: Preg Test, Ur: POSITIVE — AB

## 2017-12-21 NOTE — MAU Note (Signed)
Pt was sent for an ectopic workup from her office. Having spotting and cramping. Pain is 2/10 when she has it, intermittent.

## 2017-12-21 NOTE — MAU Provider Note (Signed)
Chief Complaint: Vaginal Bleeding   None     SUBJECTIVE HPI: Christy Jackson is a 32 y.o. G4P0030 at [redacted]w[redacted]d by LMP who presents to maternity admissions reporting menstrual-like cramping and pink then brown spotting today. She had white vaginal discharge she thought might be yeast yesterday but with no itching. Then the bleeding started today.  She had vaginal cultures done at [facility] on 12/20/17.  She has hx of ruptured ectopic and SAB x 2. She knew to come in and be evaluated when she saw bleeding today.  There are no other symptoms. She reports the bleeding is light and the pain is minimal.  She has not tried any treatments.Marland Kitchen   HPI  Past Medical History:  Diagnosis Date  . Medical history non-contributory    Past Surgical History:  Procedure Laterality Date  . LAPAROSCOPY N/A 01/16/2017   Procedure: LAPAROSCOPY OPERATIVE, RIGHT SALPINGECTOMY;  Surgeon: Tereso Newcomer, MD;  Location: [facility];  Service: Gynecology;  Laterality: N/A;   Social History   Socioeconomic History  . Marital status: Married    Spouse name: Not on file  . Number of children: Not on file  . Years of education: Not on file  . Highest education level: Not on file  Occupational History  . Not on file  Social Needs  . Financial resource strain: Not on file  . Food insecurity:    Worry: Not on file    Inability: Not on file  . Transportation needs:    Medical: Not on file    Non-medical: Not on file  Tobacco Use  . Smoking status: Never Smoker  . Smokeless tobacco: Never Used  Substance and Sexual Activity  . Alcohol use: No  . Drug use: No  . Sexual activity: Yes    Birth control/protection: None  Lifestyle  . Physical activity:    Days per week: Not on file    Minutes per session: Not on file  . Stress: Not on file  Relationships  . Social connections:    Talks on phone: Not on file    Gets together: Not on file    Attends religious service: Not on file    Active member of club or  organization: Not on file    Attends meetings of clubs or organizations: Not on file    Relationship status: Not on file  . Intimate partner violence:    Fear of current or ex partner: Not on file    Emotionally abused: Not on file    Physically abused: Not on file    Forced sexual activity: Not on file  Other Topics Concern  . Not on file  Social History Narrative  . Not on file   No current facility-administered medications on file prior to encounter.    Current Outpatient Medications on File Prior to Encounter  Medication Sig Dispense Refill  . acetaminophen (TYLENOL) 500 MG tablet Take 1,000 mg by mouth every 6 (six) hours as needed for mild pain or moderate pain.    . folic acid (FOLVITE) 1 MG tablet Take 1 tablet (1 mg total) by mouth daily. 30 tablet 10  . ibuprofen (ADVIL,MOTRIN) 600 MG tablet TAKE 1 TABLET(600 MG) BY MOUTH EVERY 6 HOURS AS NEEDED FOR HEADACHE OR MILD PAIN OR MODERATE PAIN OR CRAMPING (Patient not taking: Reported on 12/21/2017) 30 tablet 0  . progesterone (PROMETRIUM) 200 MG capsule Take 1 capsule (200 mg total) by mouth 2 (two) times daily. 60 capsule 3  No Known Allergies  ROS:  Review of Systems  Constitutional: Negative for chills, fatigue and fever.  Respiratory: Negative for shortness of breath.   Cardiovascular: Negative for chest pain.  Gastrointestinal: Negative for nausea and vomiting.  Genitourinary: Positive for pelvic pain and vaginal bleeding. Negative for difficulty urinating, dysuria, flank pain, vaginal discharge and vaginal pain.  Neurological: Negative for dizziness and headaches.  Psychiatric/Behavioral: Negative.      I have reviewed patient's Past Medical Hx, Surgical Hx, Family Hx, Social Hx, medications and allergies.   Physical Exam   Patient Vitals for the past 24 hrs:  BP Temp Temp src Pulse Resp SpO2  12/21/17 1730 118/81 98.1 F (36.7 C) Oral 93 16 -  12/21/17 1342 - - - - - 100 %   Constitutional: Well-developed,  well-nourished female in no acute distress.  Cardiovascular: normal rate Respiratory: normal effort GI: Abd soft, non-tender. Pos BS x 4 MS: Extremities nontender, no edema, normal ROM Neurologic: Alert and oriented x 4.  GU: Neg CVAT.  PELVIC EXAM: Pt declines cultures.  Pad evaluated and scant pink spotting noted in MAU.   LAB RESULTS Results for orders placed or performed during the hospital encounter of 12/21/17 (from the past 24 hour(s))  CBC     Status: None   Collection Time: 12/21/17  1:32 PM  Result Value Ref Range   WBC 8.4 4.0 - 10.5 K/uL   RBC 4.46 3.87 - 5.11 MIL/uL   Hemoglobin 12.9 12.0 - 15.0 g/dL   HCT 16.1 09.6 - 04.5 %   MCV 85.4 80.0 - 100.0 fL   MCH 28.9 26.0 - 34.0 pg   MCHC 33.9 30.0 - 36.0 g/dL   RDW 40.9 81.1 - 91.4 %   Platelets 264 150 - 400 K/uL   nRBC 0.0 0.0 - 0.2 %  hCG, quantitative, pregnancy     Status: Abnormal   Collection Time: 12/21/17  1:32 PM  Result Value Ref Range   hCG, Beta Chain, Quant, S 643 (H) <5 mIU/mL  Urinalysis, Routine w reflex microscopic     Status: Abnormal   Collection Time: 12/21/17  1:59 PM  Result Value Ref Range   Color, Urine YELLOW YELLOW   APPearance CLEAR CLEAR   Specific Gravity, Urine 1.009 1.005 - 1.030   pH 5.0 5.0 - 8.0   Glucose, UA NEGATIVE NEGATIVE mg/dL   Hgb urine dipstick MODERATE (A) NEGATIVE   Bilirubin Urine NEGATIVE NEGATIVE   Ketones, ur NEGATIVE NEGATIVE mg/dL   Protein, ur NEGATIVE NEGATIVE mg/dL   Nitrite NEGATIVE NEGATIVE   Leukocytes, UA LARGE (A) NEGATIVE   RBC / HPF 0-5 0 - 5 RBC/hpf   WBC, UA 0-5 0 - 5 WBC/hpf   Bacteria, UA RARE (A) NONE SEEN   Squamous Epithelial / LPF 0-5 0 - 5   Mucus PRESENT     --/--/O POS (11/15 1134)  IMAGING US Ob Less Than 14 Weeks With Ob Transvaginal  Addendum Date: 12/21/2017   ADDENDUM REPORT: 12/21/2017 17:24 ADDENDUM: Corrected report: FINDINGS: Intrauterine gestational sac: None. Yolk sac: Not visualized. Embryo: Not visualized. Cardiac  activity: Not visualized. Electronically Signed   By: Norva Pavlov M.D.   On: 12/21/2017 17:24   Result Date: 12/21/2017 CLINICAL DATA:  Vaginal bleeding in the first trimester. Quantitative beta HCG is 643. LMP was 11/17/2017. By LMP gestational age is 4 weeks 6 days. EDC by LMP is 08/24/2018. EXAM: OBSTETRIC <14 WK [place] AND TRANSVAGINAL OB US TECHNIQUE: Both transabdominal and  transvaginal ultrasound examinations were performed for complete evaluation of the gestation as well as the maternal uterus, adnexal regions, and pelvic cul-de-sac. Transvaginal technique was performed to assess early pregnancy. COMPARISON:  05/31/2017 FINDINGS: Intrauterine gestational sac: None Yolk sac:  Visualized. Embryo:  Visualized. Cardiac Activity: Not Visualized. Subchorionic hemorrhage:  None visualized. Maternal uterus/adnexae: Normal in appearance. No free pelvic fluid. IMPRESSION: 1.  Pregnancy of unknown location. Considerations include completed spontaneous abortion, early intrauterine pregnancy or early ectopic pregnancy. Serial quantitative beta HCG values and follow-up ultrasound are recommended as appropriate to document progression of and location of pregnancy. Ectopic pregnancy has not been excluded. 2. No adnexal mass. Electronically Signed: By: Norva Pavlov M.D. On: 12/21/2017 15:23    MAU Management/MDM: Ordered labs [place] and and reviewed results.  Findings today could represent a normal early pregnancy, spontaneous abortion or ectopic pregnancy which can be life-threatening.  Ectopic precautions were given to the patient with plan to return in 48 hours for repeat quant hcg to evaluate pregnancy development.  Pt to return sooner with emergencies.  Pt discharged with strict ectopic precautions.  ASSESSMENT 1. Pregnancy of unknown anatomic location   2. Vaginal bleeding affecting early pregnancy   3. Spotting affecting pregnancy in first trimester     PLAN Discharge home Allergies as of 12/21/2017    No Known Allergies     Medication List    STOP taking these medications   ibuprofen 600 MG tablet Commonly known as:  ADVIL,MOTRIN     TAKE these medications   acetaminophen 500 MG tablet Commonly known as:  TYLENOL Take 1,000 mg by mouth every 6 (six) hours as needed for mild pain or moderate pain.   folic acid 1 MG tablet Commonly known as:  FOLVITE Take 1 tablet (1 mg total) by mouth daily.   progesterone 200 MG capsule Commonly known as:  PROMETRIUM Take 1 capsule (200 mg total) by mouth 2 (two) times daily.      Follow-up Information    [facility] Follow up.   Why:  Return to MAU on Saturday, 12/23/17 at 4 pm for repeat labs. Return sooner as needed for emergencies. Contact information: [address] [place] [phone]          Sharen Counter Certified Nurse-Midwife 12/21/2017  5:47 PM

## 2017-12-21 NOTE — Patient Instructions (Addendum)
Please go to [facility] today   Address: [address] [place], [place] [address]  Hours:  Open 24 hours  Phone: [phone]   If you have lab work done today you will be contacted with your lab results within the next 2 weeks.  If you have not heard from [place] then please contact us. The fastest way to get your results is to register for My Chart.   IF you received an x-ray today, you will receive an invoice from [facility]. Please contact [facility] at [phone] with questions or concerns regarding your invoice.   IF you received labwork today, you will receive an invoice from [facility]. Please contact [facility] at [phone] with questions or concerns regarding your invoice.   Our billing staff will not be able to assist you with questions regarding bills from these companies.  You will be contacted with the lab results as soon as they are available. The fastest way to get your results is to activate your My Chart account. Instructions are located on the last page of this paperwork. If you have not heard from [place] regarding the results in 2 weeks, please contact this office.

## 2017-12-21 NOTE — Progress Notes (Signed)
Christy Jackson  MRN: [id number] DOB: 1985/06/03  Subjective:  Christy Jackson is a 31 y.o. female seen in office today for a chief complaint of light vaginal spotting x 3 days.  Has associated white discharge.  Notes the spotting is brown in color, not bright red.  Also having intermittent abdominal cramps, not present at this moment.  LMP 11/17/2017.  Took home pregnancy test and it was positive.Denies nausea, vomiting, fever, chills, vaginal itching, dizziness, lightheadedness.  Has PMH of miscarriage in 10/2016 and tubal pregnancy that ruptured in 12/2016.  Gynecologist is Dr. Macon Large.  She contacted their office 6 days ago and discussed her fear of another miscarriage.  She was told to start taking progesterone 200 mg Christy, which she has been doing for the past 5 days.  Review of Systems  Per HPI  Patient Active Problem List   Diagnosis Date Noted  . Recurrent pregnancy loss without current pregnancy 07/21/2017    Current Outpatient Medications on File Prior to Visit  Medication Sig Dispense Refill  . folic acid (FOLVITE) 1 MG tablet Take 1 tablet (1 mg total) by mouth Christy. 30 tablet 10  . progesterone (PROMETRIUM) 200 MG capsule Take 1 capsule (200 mg total) by mouth 2 (two) times Christy. 60 capsule 3  . acetaminophen (TYLENOL) 500 MG tablet Take 1,000 mg by mouth every 6 (six) hours as needed for mild pain or moderate pain.    Marland Kitchen ibuprofen (ADVIL,MOTRIN) 600 MG tablet TAKE 1 TABLET(600 MG) BY MOUTH EVERY 6 HOURS AS NEEDED FOR HEADACHE OR MILD PAIN OR MODERATE PAIN OR CRAMPING (Patient not taking: Reported on 12/21/2017) 30 tablet 0   No current facility-administered medications on file prior to visit.     No Known Allergies   Objective:  BP 108/78 (BP Location: Left Arm, Patient Position: Sitting, Cuff Size: Large)   Pulse 90   Temp 98.2 F (36.8 C) (Oral)   Resp 16   Ht 5\' 6"  (1.676 m)   LMP 11/17/2017   SpO2 96%   BMI 32.52 kg/m   Physical Exam  Constitutional: She is  oriented to person, place, and time. She appears well-developed and well-nourished.  HENT:  Head: Normocephalic and atraumatic.  Eyes: Conjunctivae are normal.  Neck: Normal range of motion.  Pulmonary/Chest: Effort normal.  Abdominal: Soft. Normal appearance and bowel sounds are normal. There is no tenderness. There is no rigidity, no rebound, no guarding, no tenderness at McBurney's point and negative Murphy's sign.  Genitourinary:  Genitourinary Comments: Deferred GU exam  Neurological: She is alert and oriented to person, place, and time.  Skin: Skin is warm and dry.  Psychiatric: She has a normal mood and affect.  Tearful  Vitals reviewed.    Results for orders placed or performed in visit on 12/21/17 (from the past 24 hour(s))  POCT urine pregnancy     Status: Abnormal   Collection Time: 12/21/17 10:51 AM  Result Value Ref Range   Preg Test, Ur Positive (A) Negative  POCT Wet + KOH Prep     Status: Abnormal   Collection Time: 12/21/17 12:05 PM  Result Value Ref Range   Yeast by KOH Present (A) Absent   Yeast by wet prep Present (A) Absent   WBC by wet prep Too numerous to count  (A) Few   Clue Cells Wet Prep HPF POC None None   Trich by wet prep Absent Absent   Bacteria Wet Prep HPF POC Many (A) Few   Epithelial Cells  By [employer] ([facility]) Moderate (A) None, Few, Too numerous to count   RBC,UR,HPF,POC Few (A) None RBC/hpf    Assessment and Plan :  1. Vaginal bleeding Pt is overall well appearing, NAD. Reports intermittent abdominal cramping and light vaginal spotting for the past few days, was just instructed by her gyn to start oral progesterone Christy. No pain with abdominal exam today. Self collect wet prep positive for yeast and RBC. Discussed with pt that a GU exam is needed but with her hx and presenting sx, she will likely need further evaluation by MAU. Concern for threatened abortion vs early ectopic pregnancy. She declines GU exam here and prefers to be seen at MAU.  She is stable, well appearing, vitals normal. May be transported via personal vehicle as her husband is here to drive her.  - POCT urine pregnancy - POCT Wet + KOH Prep  2. Positive pregnancy test 3. Yeast vaginitis    Benjiman Core PA-C  [facility] at [facility] 12/21/2017 1:27 PM

## 2017-12-23 ENCOUNTER — Inpatient Hospital Stay (HOSPITAL_COMMUNITY)
Admission: AD | Admit: 2017-12-23 | Discharge: 2017-12-23 | Disposition: A | Discharge status: Home / Self Care | Payer: BLUE CROSS/BLUE SHIELD | Source: Ambulatory Visit | Attending: Family Medicine | Admitting: Family Medicine

## 2017-12-23 DIAGNOSIS — O039 Complete or unspecified spontaneous abortion without complication: Secondary | ICD-10-CM

## 2017-12-23 DIAGNOSIS — Z3A01 Less than 8 weeks gestation of pregnancy: Secondary | ICD-10-CM | POA: Diagnosis not present

## 2017-12-23 LAB — HCG, QUANTITATIVE, PREGNANCY: HCG, BETA CHAIN, QUANT, S: 303 m[IU]/mL — AB (ref <5)

## 2017-12-23 NOTE — MAU Note (Signed)
Christy Jackson is a 32 y.o. at [redacted]w[redacted]d here in MAU reporting: for follow up HCG. Vaginal bleeding: still brown discharge. Patient states is the same as her previous visit. Pain score: denies Vitals:   12/23/17 1712  BP: 126/75  Pulse: 80  Resp: 16  Temp: 97.9 F (36.6 C)  SpO2: 99%     Lab orders placed from triage: HCG

## 2017-12-23 NOTE — MAU Note (Signed)
Sharen Counter CNM in Triage to discuss test results and d/c plan with pt. Verbal and written d/c instructions given by CNM and pt d/c home from Triage

## 2017-12-23 NOTE — MAU Provider Note (Signed)
S: 32 y.o. G4P0030 @[redacted]w[redacted]d  by LMP presents to MAU for repeat hcg.  She denies abdominal pain or vaginal bleeding today.    Her quant hcg on 12/21/17 was 643 and ultrasound showed no IUP and no ectopic pregnancy.  HPI  O: BP 126/75 (BP Location: Right Arm)   Pulse 80   Temp 97.9 F (36.6 C) (Oral)   Resp 16   Wt 96.6 kg   LMP 11/17/2017   SpO2 99% Comment: ra  BMI 34.38 kg/m   VS reviewed, nursing note reviewed,  Constitutional: well developed, well nourished, no distress HEENT: normocephalic CV: normal rate Pulm/chest wall: normal effort Abdomen: soft Neuro: alert and oriented x 3 Skin: warm, dry Psych: affect normal  Results for orders placed or performed during the hospital encounter of 12/23/17 (from the past 24 hour(s))  hCG, quantitative, pregnancy     Status: Abnormal   Collection Time: 12/23/17  5:21 PM  Result Value Ref Range   hCG, Beta Chain, Quant, S 303 (H) <5 mIU/mL    --/--/O POS (11/15 1134)  MDM: Ordered labs/reviewed results.  Quant hcg dropped by 50% which is most c/w miscarriage.  Discussed results with pt. Pt to f/u with hcg in office in 1 week.  Ectopic precautions given and pt to return to MAU sooner if s/sx of ectopic, as ruptured ectopic can be life threatening.  Pt stable at time of discharge.  A: 1. SAB (spontaneous abortion)     P: D/C home with ectopic/bleeding precautions F/U with outpatient labs as scheduled F/U appt with Dr Macon Large to evaluate/treat recurrent miscarriage Return to MAU as needed for emergencies  LEFTWICH-KIRBY, Marquin Patino, CNM 2:18 PM

## 2017-12-27 ENCOUNTER — Telehealth: Payer: Self-pay | Admitting: General Practice

## 2017-12-27 DIAGNOSIS — B379 Candidiasis, unspecified: Secondary | ICD-10-CM

## 2017-12-27 MED ORDER — FLUCONAZOLE 150 MG PO TABS: 150.0000 mg | ORAL_TABLET | Freq: Once | ORAL | 0 refills | Status: AC

## 2017-12-27 NOTE — Telephone Encounter (Signed)
Patient called into front office stating she is confused about her bhcg results and wants to know what they are. Informed patient of bhcg results. Patient verbalized understanding & states she was told at the ER she had a yeast infection but no prescription has been sent to her pharmacy. Patient states she keeps getting these infections frequently and isn't sure if the medication is working. Rx sent in per protocol and discussed if she is getting these infections frequently, she may come in to be seen if she'd like. Patient verbalized understanding & had no questions.

## 2017-12-28 ENCOUNTER — Other Ambulatory Visit: Payer: Self-pay | Admitting: *Deleted

## 2017-12-28 DIAGNOSIS — O039 Complete or unspecified spontaneous abortion without complication: Secondary | ICD-10-CM

## 2017-12-29 ENCOUNTER — Other Ambulatory Visit: Payer: BLUE CROSS/BLUE SHIELD

## 2017-12-29 DIAGNOSIS — O039 Complete or unspecified spontaneous abortion without complication: Secondary | ICD-10-CM

## 2017-12-30 LAB — BETA HCG QUANT (REF LAB): hCG Quant: 140 m[IU]/mL

## 2018-01-01 ENCOUNTER — Other Ambulatory Visit: Payer: Self-pay | Admitting: *Deleted

## 2018-01-01 DIAGNOSIS — O039 Complete or unspecified spontaneous abortion without complication: Secondary | ICD-10-CM

## 2018-01-05 ENCOUNTER — Other Ambulatory Visit: Payer: BLUE CROSS/BLUE SHIELD

## 2018-01-05 DIAGNOSIS — O039 Complete or unspecified spontaneous abortion without complication: Secondary | ICD-10-CM

## 2018-01-06 LAB — BETA HCG QUANT (REF LAB): hCG Quant: 236 m[IU]/mL

## 2018-01-07 ENCOUNTER — Telehealth: Payer: Self-pay | Admitting: Advanced Practice Midwife

## 2018-01-07 NOTE — Telephone Encounter (Signed)
Pt had drop in hcg from 643 on 12/21/17 to 303 on 12/23/17 then was scheduled for weekly quant hcg in the office.  First hcg on 11/15 dropped again to 140.  Then, on 01/05/18, hcg was 236.  Given pt hx of ruptured ectopic, I recommend she return for hcg and possible US in MAU.  If she is stable without pain, she may prefer repeat lab on 01/08/18, Monday, in the office but this should be run stat and pt should wait for results.    Message left on pt phone to return call to MAU office, [phone], for lab results.

## 2018-01-08 ENCOUNTER — Telehealth: Payer: Self-pay | Admitting: Advanced Practice Midwife

## 2018-01-08 ENCOUNTER — Other Ambulatory Visit: Payer: Self-pay | Admitting: Advanced Practice Midwife

## 2018-01-08 NOTE — Telephone Encounter (Signed)
Pt with hx of ruptured ectopic pregnancy.  Called pt to discuss hcg follow up results from 01/05/18.  Pt was initially seen in MAU on 12/21/17 and had hcg of 643, then returned on 12/23/17 and had drop in hcg to 303.  She was scheduled for weekly nonstat hcg in the office.  First hcg on 11/15 was 140, then 1 week later on 11/22 hcg was 236.  Called pt on 11/24 and left a message.  Called again today and spoke with pt about her abnormal lab results. She denies intercourse since her last MAU visit on 12/23/17.  She reports an episode of light menstrual bleeding starting 4 days ago and finishing today. She denies any pain.   Recommend pt come to MAU for stat hcg but pt does not desire to come to emergency room.  Appt made for 11 am tomorrow, 01/09/18 for stat hcg in office.  She still has appt to see Christy CarbonJennifer Rasch, NP, on 01/10/18 for miscarriage follow up. I did not change this appointment and will wait for lab results tomorrow.

## 2018-01-08 NOTE — Progress Notes (Signed)
error 

## 2018-01-09 ENCOUNTER — Ambulatory Visit: Payer: BLUE CROSS/BLUE SHIELD | Admitting: General Practice

## 2018-01-09 ENCOUNTER — Telehealth: Payer: Self-pay | Admitting: Advanced Practice Midwife

## 2018-01-09 DIAGNOSIS — Z8759 Personal history of other complications of pregnancy, childbirth and the puerperium: Secondary | ICD-10-CM

## 2018-01-09 DIAGNOSIS — IMO0002 Reserved for concepts with insufficient information to code with codable children: Secondary | ICD-10-CM

## 2018-01-09 DIAGNOSIS — R799 Abnormal finding of blood chemistry, unspecified: Secondary | ICD-10-CM

## 2018-01-09 NOTE — Progress Notes (Signed)
Reviewed patient's chart with Dr Jolayne Pantheronstant who states bhcg does not need to be stat today, can be routine- patient needs to keep appt tomorrow for follow up.  Discussed importance of keeping appt tomorrow with patient. Patient verbalized understanding and states she has not had bleeding so far today or pain.

## 2018-01-09 NOTE — Telephone Encounter (Signed)
Called Dr Adrian BlackwaterStinson to review pt chart and follow up plan today. [facility] as ordered. Upon further review, order was changed to nonstat since pt has follow up appointment tomorrow, 01/10/18 in the office.  Hcg was drawn at 11 am today but is not currently resulted.  Dr Adrian BlackwaterStinson reviewed lab results and pt hx of ectopic pregnancy.  Pt reported no pain and no bleeding today in the office.  She was informed of the importance of keeping her appointment on 11/27.  Given that pt is asymptomatic, she can follow up tomorrow as scheduled for lab results. If hcg continues to rise inappropriately, consider methotrexate for presumed ectopic pregnancy.

## 2018-01-10 ENCOUNTER — Encounter (HOSPITAL_COMMUNITY): Payer: Self-pay

## 2018-01-10 ENCOUNTER — Encounter: Payer: Self-pay | Admitting: Obstetrics and Gynecology

## 2018-01-10 ENCOUNTER — Inpatient Hospital Stay (HOSPITAL_COMMUNITY)
Admission: AD | Admit: 2018-01-10 | Discharge: 2018-01-10 | Disposition: A | Discharge status: Home / Self Care | Payer: BLUE CROSS/BLUE SHIELD | Source: Ambulatory Visit | Attending: Family Medicine | Admitting: Family Medicine

## 2018-01-10 ENCOUNTER — Other Ambulatory Visit: Payer: Self-pay

## 2018-01-10 ENCOUNTER — Ambulatory Visit (INDEPENDENT_AMBULATORY_CARE_PROVIDER_SITE_OTHER): Payer: BLUE CROSS/BLUE SHIELD | Admitting: Obstetrics and Gynecology

## 2018-01-10 VITALS — BP 103/75 | HR 95 | Wt 211.7 lb

## 2018-01-10 DIAGNOSIS — O3680X0 Pregnancy with inconclusive fetal viability, not applicable or unspecified: Secondary | ICD-10-CM

## 2018-01-10 DIAGNOSIS — O009 Unspecified ectopic pregnancy without intrauterine pregnancy: Secondary | ICD-10-CM

## 2018-01-10 DIAGNOSIS — Z3A01 Less than 8 weeks gestation of pregnancy: Secondary | ICD-10-CM | POA: Insufficient documentation

## 2018-01-10 DIAGNOSIS — O0281 Inappropriate change in quantitative human chorionic gonadotropin (hCG) in early pregnancy: Secondary | ICD-10-CM

## 2018-01-10 DIAGNOSIS — O26891 Other specified pregnancy related conditions, first trimester: Secondary | ICD-10-CM | POA: Diagnosis present

## 2018-01-10 LAB — HCG, QUANTITATIVE, PREGNANCY: HCG, BETA CHAIN, QUANT, S: 274 m[IU]/mL — AB (ref <5)

## 2018-01-10 LAB — COMPREHENSIVE METABOLIC PANEL
ALT: 18 U/L (ref 0–44)
AST: 21 U/L (ref 15–41)
Albumin: 3.6 g/dL (ref 3.5–5.0)
Alkaline Phosphatase: 84 U/L (ref 38–126)
Anion gap: 9 (ref 5–15)
BILIRUBIN TOTAL: 0.3 mg/dL (ref 0.3–1.2)
BUN: 6 mg/dL (ref 6–20)
CALCIUM: 8.7 mg/dL — AB (ref 8.9–10.3)
CHLORIDE: 104 mmol/L (ref 98–111)
CO2: 24 mmol/L (ref 22–32)
CREATININE: 0.53 mg/dL (ref 0.44–1.00)
Glucose, Bld: 127 mg/dL — ABNORMAL HIGH (ref 70–99)
Potassium: 3.4 mmol/L — ABNORMAL LOW (ref 3.5–5.1)
Sodium: 137 mmol/L (ref 135–145)
TOTAL PROTEIN: 7.7 g/dL (ref 6.5–8.1)

## 2018-01-10 LAB — CBC
HCT: 38.7 % (ref 36.0–46.0)
Hemoglobin: 12.9 g/dL (ref 12.0–15.0)
MCH: 28.9 pg (ref 26.0–34.0)
MCHC: 33.3 g/dL (ref 30.0–36.0)
MCV: 86.8 fL (ref 80.0–100.0)
NRBC: 0 % (ref 0.0–0.2)
PLATELETS: 303 10*3/uL (ref 150–400)
RBC: 4.46 MIL/uL (ref 3.87–5.11)
RDW: 12.9 % (ref 11.5–15.5)
WBC: 7.2 10*3/uL (ref 4.0–10.5)

## 2018-01-10 LAB — BETA HCG QUANT (REF LAB): HCG QUANT: 297 m[IU]/mL

## 2018-01-10 MED ORDER — METHOTREXATE INJECTION FOR WOMEN'S HOSPITAL
50.0000 mg/m2 | Freq: Once | INTRAMUSCULAR | Status: AC
  Administered 2018-01-10: 105 mg via INTRAMUSCULAR
  Filled 2018-01-10: qty 2.1

## 2018-01-10 NOTE — MAU Note (Signed)
Pt here from downstairs clinic for treatment of ectopic pregnancy

## 2018-01-10 NOTE — Progress Notes (Signed)
Mrs. Christy Jackson is a 32 y.o. female here lab results; she has been followed between the MAU and the [facility] for quants.  Her first visit on 11/7 was for pain and bleeding. Quant at that time was 68643, she returned on 11/9 and her quant dropped from 55303. She continued to have bleeding and thought she had a miscarriage. She was brought back on 11/15 and quant was 140.   Quant 11/22: 236 Quant: 11/26: 297  Counseled patient in detail about the inappropriate  rise/decline in Quants. Given her history of ruptured ectopic she was counseled to received MTX for presumed ectopic. Discussed with Dr. Vergie LivingPickens who agrees. The patient is agreeable to plan of care and was escorted upstairs by RN.   O positive blood type.   Duane Lopeasch, Jennifer I, NP 01/10/2018 3:16 PM

## 2018-01-10 NOTE — Discharge Instructions (Signed)
Methotrexate Treatment for an Ectopic Pregnancy, Care After °Refer to this sheet in the next few weeks. These instructions provide you with information on caring for yourself after your procedure. Your health care provider may also give you more specific instructions. Your treatment has been planned according to current medical practices, but problems sometimes occur. Call your health care provider if you have any problems or questions after your procedure. °What can I expect after the procedure? °You may have some abdominal cramping, vaginal bleeding, and fatigue in the first few days after taking methotrexate. Some other possible side effects of methotrexate include: °· Nausea. °· Vomiting. °· Diarrhea. °· Mouth sores. °· Swelling or irritation of the lining of your lungs (pneumonitis). °· Liver damage. °· Hair loss. ° °Follow these instructions at home: °After you have received the methotrexate medicine, you need to be careful of your activities and watch your condition for several weeks. It may take 1 week before your hormone levels return to normal. °Activity °· Do not have sexual intercourse until your health care provider says it is safe to do so. °· You may resume your usual diet. °· Limit strenuous activity. °· Do not drink alcohol. °General instructions °· Do not take aspirin, ibuprofen, or naproxen (nonsteroidal anti-inflammatory drugs [NSAIDs]). °· Do not take folic acid, prenatal vitamins, or other vitamins that contain folic acid. °· Avoid traveling too far away from your health care provider. °· Keep all follow-up visits as told by your health care provider. This is important. °Contact a health care provider if: °· You cannot control your nausea and vomiting. °· You cannot control your diarrhea. °· You have sores in your mouth and want treatment. °· You need pain medicine for your abdominal pain. °· You have a rash. °· You are having a reaction to the medicine. °Get help right away if: °· You have  increasing abdominal or pelvic pain. °· You notice increased bleeding. °· You feel light-headed, or you faint. °· You have shortness of breath. °· Your heart rate increases. °· You have a cough. °· You have chills. °· You have a fever. °This information is not intended to replace advice given to you by your health care provider. Make sure you discuss any questions you have with your health care provider. °Document Released: 01/20/2011 Document Revised: 07/09/2015 Document Reviewed: 11/19/2012 °Elsevier Interactive Patient Education © 2017 [employer] Inc. ° °

## 2018-01-10 NOTE — MAU Provider Note (Signed)
History     CSN: [id number]  Arrival date and time: 01/10/18 1458  Chief Complaint  Patient presents with  . Ectopic Pregnancy   32 y.o. [id number] @[redacted]w[redacted]d  by LMP sent from clinic for MTX. She was seen in MAU on 12/21/17 for cramping and spotting and qHCG was 643, US showed no IUP or adnexal mass. Quant HCG 2 days later was 303 and she was presumed to have SAB. Repeat qHCG 1 week later was 140. Repeat qHCG 1 week after that was 236, therefore qHCG was repeated again (yesterday) and was 297. She is having minimal intermittent LLQ pain and no VB.    OB History    Gravida  4   Para      Term      Preterm      AB  3   Living        SAB  2   TAB      Ectopic  1   Multiple      Live Births              Past Medical History:  Diagnosis Date  . Medical history non-contributory     Past Surgical History:  Procedure Laterality Date  . LAPAROSCOPY N/A 01/16/2017   Procedure: LAPAROSCOPY OPERATIVE, RIGHT SALPINGECTOMY;  Surgeon: Tereso NewcomerAnyanwu, Ugonna A, MD;  Location: [facility];  Service: Gynecology;  Laterality: N/A;    History reviewed. No pertinent family history.  Social History   Tobacco Use  . Smoking status: Never Smoker  . Smokeless tobacco: Never Used  Substance Use Topics  . Alcohol use: No  . Drug use: No    Allergies: No Known Allergies  Medications Prior to Admission  Medication Sig Dispense Refill Last Dose  . acetaminophen (TYLENOL) 500 MG tablet Take 1,000 mg by mouth every 6 (six) hours as needed for mild pain or moderate pain.   Not Taking  . folic acid (FOLVITE) 1 MG tablet Take 1 tablet (1 mg total) by mouth daily. 30 tablet 10 Taking    Review of Systems  Gastrointestinal: Positive for abdominal pain (minimal).  Genitourinary: Negative for vaginal bleeding.   Physical Exam   Blood pressure 108/80, pulse 83, temperature 98.5 F (36.9 C), resp. rate 12, height 5\' 6"  (1.676 m), weight 96.5 kg, last menstrual period 11/17/2017, SpO2 96  %.  Physical Exam  Constitutional: She is oriented to person, place, and time. She appears well-developed and well-nourished. No distress.  HENT:  Head: Normocephalic and atraumatic.  Cardiovascular: Normal rate.  Respiratory: Effort normal. No respiratory distress.  Musculoskeletal: Normal range of motion.  Neurological: She is alert and oriented to person, place, and time.  Psychiatric: She has a normal mood and affect.    Results for orders placed or performed during the hospital encounter of 01/10/18 (from the past 24 hour(s))  CBC     Status: None   Collection Time: 01/10/18  3:42 PM  Result Value Ref Range   WBC 7.2 4.0 - 10.5 K/uL   RBC 4.46 3.87 - 5.11 MIL/uL   Hemoglobin 12.9 12.0 - 15.0 g/dL   HCT 62.938.7 52.836.0 - 41.346.0 %   MCV 86.8 80.0 - 100.0 fL   MCH 28.9 26.0 - 34.0 pg   MCHC 33.3 30.0 - 36.0 g/dL   RDW 24.412.9 01.011.5 - 27.215.5 %   Platelets 303 150 - 400 K/uL   nRBC 0.0 0.0 - 0.2 %  Comprehensive metabolic panel     Status: Abnormal  Collection Time: 01/10/18  3:42 PM  Result Value Ref Range   Sodium 137 135 - 145 mmol/L   Potassium 3.4 (L) 3.5 - 5.1 mmol/L   Chloride 104 98 - 111 mmol/L   CO2 24 22 - 32 mmol/L   Glucose, Bld 127 (H) 70 - 99 mg/dL   BUN 6 6 - 20 mg/dL   Creatinine, Ser 1.61 0.44 - 1.00 mg/dL   Calcium 8.7 (L) 8.9 - 10.3 mg/dL   Total Protein 7.7 6.5 - 8.1 g/dL   Albumin 3.6 3.5 - 5.0 g/dL   AST 21 15 - 41 U/L   ALT 18 0 - 44 U/L   Alkaline Phosphatase 84 38 - 126 U/L   Total Bilirubin 0.3 0.3 - 1.2 mg/dL   GFR calc non Af Amer >60 >60 mL/min   GFR calc Af Amer >60 >60 mL/min   Anion gap 9 5 - 15  hCG, quantitative, pregnancy     Status: Abnormal   Collection Time: 01/10/18  3:42 PM  Result Value Ref Range   hCG, Beta Chain, Quant, S 274 (H) <5 mIU/mL   MAU Course  Procedures Methotrexate  MDM Rasch (FNP in clinic) consulted with Dr. Vergie Living and plan for MTX. Labs ordered and reviewed. Will follow qHCG on day 4. Strict return precautions.  Questions answered. Stable for discharge home.   Assessment and Plan   1. Ectopic pregnancy without intrauterine pregnancy, unspecified location    Discharge home Follow up in MAU on 01/13/18 for labs Strict return precautions  Allergies as of 01/10/2018   No Known Allergies     Medication List    STOP taking these medications   folic acid 1 MG tablet Commonly known as:  FOLVITE     TAKE these medications   acetaminophen 500 MG tablet Commonly known as:  TYLENOL Take 1,000 mg by mouth every 6 (six) hours as needed for mild pain or moderate pain.      [name], CNM 01/10/2018, 5:36 PM

## 2018-01-13 ENCOUNTER — Inpatient Hospital Stay (HOSPITAL_COMMUNITY)
Admission: AD | Admit: 2018-01-13 | Discharge: 2018-01-13 | Disposition: A | Discharge status: Home / Self Care | Payer: BLUE CROSS/BLUE SHIELD | Source: Ambulatory Visit | Attending: Obstetrics and Gynecology | Admitting: Obstetrics and Gynecology

## 2018-01-13 DIAGNOSIS — O009 Unspecified ectopic pregnancy without intrauterine pregnancy: Secondary | ICD-10-CM | POA: Diagnosis present

## 2018-01-13 LAB — HCG, QUANTITATIVE, PREGNANCY: hCG, Beta Chain, Quant, S: 256 m[IU]/mL — ABNORMAL HIGH (ref <5)

## 2018-01-13 MED ORDER — PROMETHAZINE HCL 25 MG PO TABS: 25.0000 mg | ORAL_TABLET | Freq: Four times a day (QID) | ORAL | 0 refills | Status: DC | PRN

## 2018-01-13 NOTE — Discharge Instructions (Signed)
Methotrexate Treatment for an Ectopic Pregnancy, Care After °Refer to this sheet in the next few weeks. These instructions provide you with information on caring for yourself after your procedure. Your health care provider may also give you more specific instructions. Your treatment has been planned according to current medical practices, but problems sometimes occur. Call your health care provider if you have any problems or questions after your procedure. °What can I expect after the procedure? °You may have some abdominal cramping, vaginal bleeding, and fatigue in the first few days after taking methotrexate. Some other possible side effects of methotrexate include: °· Nausea. °· Vomiting. °· Diarrhea. °· Mouth sores. °· Swelling or irritation of the lining of your lungs (pneumonitis). °· Liver damage. °· Hair loss. ° °Follow these instructions at home: °After you have received the methotrexate medicine, you need to be careful of your activities and watch your condition for several weeks. It may take 1 week before your hormone levels return to normal. °Activity °· Do not have sexual intercourse until your health care provider says it is safe to do so. °· You may resume your usual diet. °· Limit strenuous activity. °· Do not drink alcohol. °General instructions °· Do not take aspirin, ibuprofen, or naproxen (nonsteroidal anti-inflammatory drugs [NSAIDs]). °· Do not take folic acid, prenatal vitamins, or other vitamins that contain folic acid. °· Avoid traveling too far away from your health care provider. °· Keep all follow-up visits as told by your health care provider. This is important. °Contact a health care provider if: °· You cannot control your nausea and vomiting. °· You cannot control your diarrhea. °· You have sores in your mouth and want treatment. °· You need pain medicine for your abdominal pain. °· You have a rash. °· You are having a reaction to the medicine. °Get help right away if: °· You have  increasing abdominal or pelvic pain. °· You notice increased bleeding. °· You feel light-headed, or you faint. °· You have shortness of breath. °· Your heart rate increases. °· You have a cough. °· You have chills. °· You have a fever. °This information is not intended to replace advice given to you by your health care provider. Make sure you discuss any questions you have with your health care provider. °Document Released: 01/20/2011 Document Revised: 07/09/2015 Document Reviewed: 11/19/2012 °Elsevier Interactive Patient Education © 2017 [employer] Inc. ° °

## 2018-01-13 NOTE — MAU Note (Signed)
Bobbijo Hulan AmatoMustafa is a 32 y.o. at 5475w1d here in MAU reporting: for follow up HCG Pain score: denies Vaginal bleeding: denies Vitals:   01/13/18 1723  BP: 118/77  Pulse: 80  Resp: 16  Temp: 97.9 F (36.6 C)  SpO2: 97%      Lab orders placed from triage: hcg sign and held order

## 2018-01-13 NOTE — MAU Note (Signed)
Judeth HornErin Lawrence NP in Triage to discuss lab results and d/c plan with pt. NP gave pt written and verbal d/c instructions and pt d/c home from triage

## 2018-01-13 NOTE — MAU Provider Note (Signed)
[name]  is a 32 y.o. G4P0030  at 3030w1d who presents to MAU today for follow-up quant hCG. This is day 4 s/p MTX for suspected ectopic pregnancy. The patient denies abdominal pain or vaginal bleeding. Reports some nausea, but no vomiting.   BP 118/77 (BP Location: Right Arm)   Pulse 80   Temp 97.9 F (36.6 C) (Oral)   Resp 16   Wt 95.7 kg   LMP 11/17/2017   SpO2 97% Comment: ra  BMI 34.06 kg/m   GENERAL: Well-developed, well-nourished female in no acute distress.  HEENT: Normocephalic, atraumatic.   LUNGS: Effort normal HEART: Regular rate  SKIN: Warm, dry and without erythema PSYCH: Normal mood and affect   A: 1. Ectopic pregnancy without intrauterine pregnancy, unspecified location   -HCG down to 256 from 274   P: Discharge home Pt scheduled for stat HCG in office on Tuesday (day 7) Strict ectopic return precautions Rx phenergan per patient request  Judeth HornLawrence, Tanicka Bisaillon, NP  01/13/2018 7:22 PM

## 2018-01-16 ENCOUNTER — Ambulatory Visit: Payer: BLUE CROSS/BLUE SHIELD

## 2018-01-16 ENCOUNTER — Inpatient Hospital Stay (HOSPITAL_COMMUNITY)
Admission: AD | Admit: 2018-01-16 | Discharge: 2018-01-17 | Disposition: A | Discharge status: Home / Self Care | Payer: BLUE CROSS/BLUE SHIELD | Source: Ambulatory Visit | Attending: Family Medicine | Admitting: Family Medicine

## 2018-01-16 DIAGNOSIS — O0281 Inappropriate change in quantitative human chorionic gonadotropin (hCG) in early pregnancy: Secondary | ICD-10-CM

## 2018-01-16 DIAGNOSIS — O00102 Left tubal pregnancy without intrauterine pregnancy: Secondary | ICD-10-CM | POA: Insufficient documentation

## 2018-01-16 LAB — HCG, QUANTITATIVE, PREGNANCY: hCG, Beta Chain, Quant, S: 208 m[IU]/mL — ABNORMAL HIGH (ref <5)

## 2018-01-16 NOTE — MAU Note (Signed)
PT SAYS HAD MTHOTREXATE ON WED  THEN CAME BACK  SAT FOR LABS  THEN CAME AGAIN TODAY  FOR  LABS-  NO PAIN.   PAIN STARTED  AT 1030PM - ON LEFT SIDE.     HAS NOT  HAD ANY BLEEDING.    BLEEDING STARTED  AT 1030PM-   PASSED  BLOOD  CLOT-  SIZE OF FINGER. PAD ON  IN TRIAGE-    NOTHING

## 2018-01-16 NOTE — Progress Notes (Signed)
Here for stat bhcg. Denies pain.or  bleeding. Reviewed results with Dr. Earlene PlaterWallace and informed patient appropriate drop in bhcg. Plan weekly bhcg until level returns to normal.Also advised to go to mau if severe pain or heavy bleeding.

## 2018-01-16 NOTE — Progress Notes (Signed)
From Pt here today for STAT beta lab s/p day 4 methotrexate tx.  Pt denies any pain or bleeding.  Pt advised that she will be here for two hours for results and then she will receive f/u.  Pt agreed.

## 2018-01-17 ENCOUNTER — Other Ambulatory Visit: Payer: Self-pay

## 2018-01-17 ENCOUNTER — Inpatient Hospital Stay (HOSPITAL_COMMUNITY): Payer: BLUE CROSS/BLUE SHIELD

## 2018-01-17 ENCOUNTER — Encounter (HOSPITAL_COMMUNITY): Payer: Self-pay

## 2018-01-17 ENCOUNTER — Encounter (HOSPITAL_COMMUNITY): Payer: Self-pay | Admitting: Emergency Medicine

## 2018-01-17 ENCOUNTER — Inpatient Hospital Stay (EMERGENCY_DEPARTMENT_HOSPITAL)
Admission: AD | Admit: 2018-01-17 | Discharge: 2018-01-18 | Disposition: A | Discharge status: Home / Self Care | Payer: BLUE CROSS/BLUE SHIELD | Source: Home / Self Care | Attending: Obstetrics & Gynecology | Admitting: Obstetrics & Gynecology

## 2018-01-17 DIAGNOSIS — R109 Unspecified abdominal pain: Secondary | ICD-10-CM | POA: Diagnosis not present

## 2018-01-17 DIAGNOSIS — Z3A08 8 weeks gestation of pregnancy: Secondary | ICD-10-CM

## 2018-01-17 DIAGNOSIS — O00109 Unspecified tubal pregnancy without intrauterine pregnancy: Secondary | ICD-10-CM | POA: Insufficient documentation

## 2018-01-17 DIAGNOSIS — O26891 Other specified pregnancy related conditions, first trimester: Secondary | ICD-10-CM

## 2018-01-17 DIAGNOSIS — O00102 Left tubal pregnancy without intrauterine pregnancy: Secondary | ICD-10-CM

## 2018-01-17 DIAGNOSIS — O009 Unspecified ectopic pregnancy without intrauterine pregnancy: Secondary | ICD-10-CM

## 2018-01-17 LAB — CBC
HCT: 34.3 % — ABNORMAL LOW (ref 36.0–46.0)
HCT: 38.1 % (ref 36.0–46.0)
Hemoglobin: 11.5 g/dL — ABNORMAL LOW (ref 12.0–15.0)
Hemoglobin: 12.5 g/dL (ref 12.0–15.0)
MCH: 28.7 pg (ref 26.0–34.0)
MCH: 28.7 pg (ref 26.0–34.0)
MCHC: 33.5 g/dL (ref 30.0–36.0)
MCHC: 32.8 g/dL (ref 30.0–36.0)
MCV: 85.5 fL (ref 80.0–100.0)
MCV: 87.6 fL (ref 80.0–100.0)
NRBC: 0 % (ref 0.0–0.2)
PLATELETS: 231 10*3/uL (ref 150–400)
Platelets: 254 10*3/uL (ref 150–400)
RBC: 4.01 MIL/uL (ref 3.87–5.11)
RBC: 4.35 MIL/uL (ref 3.87–5.11)
RDW: 12.7 % (ref 11.5–15.5)
RDW: 12.8 % (ref 11.5–15.5)
WBC: 6.7 10*3/uL (ref 4.0–10.5)
WBC: 7.9 10*3/uL (ref 4.0–10.5)

## 2018-01-17 LAB — HCG, QUANTITATIVE, PREGNANCY: hCG, Beta Chain, Quant, S: 137 m[IU]/mL — ABNORMAL HIGH (ref <5)

## 2018-01-17 MED ORDER — OXYCODONE-ACETAMINOPHEN 5-325 MG PO TABS
2.0000 | ORAL_TABLET | Freq: Once | ORAL | Status: AC
  Administered 2018-01-17: 2 via ORAL
  Filled 2018-01-17: qty 2

## 2018-01-17 NOTE — MAU Note (Signed)
Pt presents to MAU c/o LLQ pain that she rates a 10/10 pt reports the pain is sharp and she is unable to get comfortable. Pt states she has vomited once and is nauseated. Pt reports spotting. Pt also states she has a HA as well as dizziness and weakness.

## 2018-01-17 NOTE — MAU Provider Note (Signed)
History     CSN: [id number]  Arrival date and time: 01/16/18 2350   First Provider Initiated Contact with Patient 01/17/18 [phone]      Chief Complaint  Patient presents with  . Abdominal Pain   HPI Christy Jackson is a 32 y.o. G4P0030 7 days s/p MTX for a presumed ectopic pregnancy who presents with a sudden onset of abdominal pain. She states the pain started at 2230 and she rates the pain a 9/10. She tried tylenol with no relief. She also reports some bleeding but that has since stopped.   OB History    Gravida  4   Para      Term      Preterm      AB  3   Living        SAB  2   TAB      Ectopic  1   Multiple      Live Births              Past Medical History:  Diagnosis Date  . Medical history non-contributory     Past Surgical History:  Procedure Laterality Date  . LAPAROSCOPY N/A 01/16/2017   Procedure: LAPAROSCOPY OPERATIVE, RIGHT SALPINGECTOMY;  Surgeon: Tereso Newcomer, MD;  Location: [facility];  Service: Gynecology;  Laterality: N/A;    No family history on file.  Social History   Tobacco Use  . Smoking status: Never Smoker  . Smokeless tobacco: Never Used  Substance Use Topics  . Alcohol use: No  . Drug use: No    Allergies: No Known Allergies  Medications Prior to Admission  Medication Sig Dispense Refill Last Dose  . acetaminophen (TYLENOL) 500 MG tablet Take 1,000 mg by mouth every 6 (six) hours as needed for mild pain or moderate pain.   Not Taking  . promethazine (PHENERGAN) 25 MG tablet Take 1 tablet (25 mg total) by mouth every 6 (six) hours as needed for nausea or vomiting. 30 tablet 0     Review of Systems  Constitutional: Negative.  Negative for fatigue and fever.  HENT: Negative.   Respiratory: Negative.  Negative for shortness of breath.   Cardiovascular: Negative.  Negative for chest pain.  Gastrointestinal: Positive for abdominal pain. Negative for constipation, diarrhea, nausea and vomiting.  Genitourinary: Positive  for vaginal bleeding. Negative for dysuria.  Neurological: Negative.  Negative for dizziness and headaches.   Physical Exam   Blood pressure 133/71, pulse 77, temperature (!) 97.2 F (36.2 C), temperature source Oral, resp. rate 18, height 5\' 6"  (1.676 m), weight 97.2 kg, last menstrual period 11/17/2017, SpO2 100 %.  Physical Exam  Nursing note and vitals reviewed. Constitutional: She is oriented to person, place, and time. She appears well-developed and well-nourished. No distress.  HENT:  Head: Normocephalic.  Eyes: Pupils are equal, round, and reactive to light.  Cardiovascular: Normal rate, regular rhythm and normal heart sounds.  Respiratory: Effort normal and breath sounds normal. No respiratory distress.  GI: Soft. Bowel sounds are normal. She exhibits no distension. There is no tenderness.  Neurological: She is alert and oriented to person, place, and time.  Skin: Skin is warm and dry.  Psychiatric: She has a normal mood and affect. Her behavior is normal. Judgment and thought content normal.    MAU Course  Procedures Results for Christy, Jackson (MRN [id number]) as of 01/17/2018 00:42  Ref. Range 12/21/2017 13:32 12/23/2017 17:21 01/10/2018 15:42 01/13/2018 17:45 01/16/2018 14:12  HCG, Beta Chain, Quant, S  Latest Ref Range: <5 mIU/mL 643 (H) 303 (H) 274 (H) 256 (H) 208 (H)   Results for orders placed or performed during the hospital encounter of 01/16/18 (from the past 24 hour(s))  CBC     Status: Abnormal   Collection Time: 01/17/18  1:26 AM  Result Value Ref Range   WBC 6.7 4.0 - 10.5 K/uL   RBC 4.01 3.87 - 5.11 MIL/uL   Hemoglobin 11.5 (L) 12.0 - 15.0 g/dL   HCT 16.134.3 (L) 09.636.0 - 04.546.0 %   MCV 85.5 80.0 - 100.0 fL   MCH 28.7 26.0 - 34.0 pg   MCHC 33.5 30.0 - 36.0 g/dL   RDW 40.912.7 81.111.5 - 91.415.5 %   Platelets 254 150 - 400 K/uL    [place] Ob Transvaginal  Result Date: 01/17/2018 CLINICAL DATA:  Pain after methotrexate EXAM: TRANSVAGINAL OB ULTRASOUND TECHNIQUE: Transvaginal  ultrasound was performed for complete evaluation of the gestation as well as the maternal uterus, adnexal regions, and pelvic cul-de-sac. COMPARISON:  12/21/2017 FINDINGS: Intrauterine gestational sac: None Yolk sac:  Not visualized Embryo:  Not visualized Cardiac Activity: Heart Rate:  bpm MSD:   mm    w     d CRL:     mm    w  d                  US EDC: Subchorionic hemorrhage:  None visualized. Maternal uterus/adnexae: Soft tissue area noted adjacent to the left ovary measuring 1.9 x 1.7 x 1.1 cm of unknown etiology. This conceivably could reflect an ectopic pregnancy. No free fluid. IMPRESSION: No intrauterine gestation visualized. Area of soft tissue separate from the left ovary in the left adnexa measuring up to 1.9 cm. Cannot exclude ectopic pregnancy. Electronically Signed   By: Charlett NoseKevin  Dover M.D.   On: 01/17/2018 00:36   MDM US OB Transvaginal CBC Percocet PO  Consulted with Dr. Adrian BlackwaterStinson- reviewed labs and ultrasound as well as patient presentation, ok to discharge patient home. Patient can use ibuprofen and tylenol as needed. Message sent to clinic RN to call patient to evaluate pain Thursday. Strict return precautions reviewed.  Assessment and Plan   1. Left tubal pregnancy without intrauterine pregnancy    -Discharge home in stable condition -Strict pain and bleeding precautions discussed -[facility] -Patient may return to MAU as needed or if her condition were to change or worsen  Rolm BookbinderCaroline M Quinette Hentges CNM 01/17/2018, 1:50 AM

## 2018-01-17 NOTE — MAU Note (Signed)
TOOK  1 TYLENOL AT  2300. - NO RELIEF

## 2018-01-17 NOTE — MAU Provider Note (Signed)
Chief Complaint: Abdominal Pain; Emesis; Nausea; Dizziness; and Fatigue   First Provider Initiated Contact with Patient 01/17/18 2055     SUBJECTIVE HPI: Christy Jackson is a 32 y.o. G4P0030 at [redacted]w[redacted]d who presents to [facility] reporting abdominal pain. Currently being monitored for ectopic pregnancy. Had MTX 11/27. Initially reported no abdominal pain until yesterday. Was seen in MAU last night for LLQ pain. Pt was discharged home. States that pain has worsened since last night. States pain makes her vomit. Also reports dizziness & chills.  Denies fever, nausea, cough. Pain does not radiate. Some bright red spotting.   Location: LLQ abdomen Quality: sharp Severity: 10/10 on pain scale Duration: 1 day Timing: constant Modifying factors: nothing makes better or worse Associated signs and symptoms: pain causes her to vomit  Past Medical History:  Diagnosis Date  . Medical history non-contributory    OB History  Gravida Para Term Preterm AB Living  4       3    SAB TAB Ectopic Multiple Live Births  2   1        # Outcome Date GA Lbr Len/2nd Weight Sex Delivery Anes PTL Lv  4 Current           3 SAB 05/31/17 [redacted]w[redacted]d         2 Ectopic 01/16/17 [redacted]w[redacted]d    ECTOPIC        Birth Comments: Had laparoscopic right salpingectomy     Complications: Ruptured right tubal ectopic pregnancy causing hemoperitoneum  1 SAB            Past Surgical History:  Procedure Laterality Date  . LAPAROSCOPY N/A 01/16/2017   Procedure: LAPAROSCOPY OPERATIVE, RIGHT SALPINGECTOMY;  Surgeon: Tereso Newcomer, MD;  Location: [facility];  Service: Gynecology;  Laterality: N/A;   Social History   Socioeconomic History  . Marital status: Married    Spouse name: Not on file  . Number of children: Not on file  . Years of education: Not on file  . Highest education level: Not on file  Occupational History  . Not on file  Social Needs  . Financial resource strain: Not on file  . Food insecurity:    Worry: Not  on file    Inability: Not on file  . Transportation needs:    Medical: Not on file    Non-medical: Not on file  Tobacco Use  . Smoking status: Never Smoker  . Smokeless tobacco: Never Used  Substance and Sexual Activity  . Alcohol use: No  . Drug use: No  . Sexual activity: Yes    Birth control/protection: None  Lifestyle  . Physical activity:    Days per week: Not on file    Minutes per session: Not on file  . Stress: Not on file  Relationships  . Social connections:    Talks on phone: Not on file    Gets together: Not on file    Attends religious service: Not on file    Active member of club or organization: Not on file    Attends meetings of clubs or organizations: Not on file    Relationship status: Not on file  . Intimate partner violence:    Fear of current or ex partner: Not on file    Emotionally abused: Not on file    Physically abused: Not on file    Forced sexual activity: Not on file  Other Topics Concern  . Not on file  Social History Narrative  .  Not on file   No family history on file. No current facility-administered medications on file prior to encounter.    Current Outpatient Medications on File Prior to Encounter  Medication Sig Dispense Refill  . acetaminophen (TYLENOL) 500 MG tablet Take 1,000 mg by mouth every 6 (six) hours as needed for mild pain or moderate pain.    . promethazine (PHENERGAN) 25 MG tablet Take 1 tablet (25 mg total) by mouth every 6 (six) hours as needed for nausea or vomiting. 30 tablet 0   No Known Allergies  I have reviewed patient's Past Medical Hx, Surgical Hx, Family Hx, Social Hx, medications and allergies.   Review of Systems  Constitutional: Positive for chills and fatigue. Negative for fever.  Respiratory: Negative for cough.   Gastrointestinal: Positive for abdominal pain and vomiting. Negative for constipation, diarrhea and nausea.  Genitourinary: Positive for vaginal bleeding. Negative for vaginal discharge.   Neurological: Positive for dizziness. Negative for syncope and headaches.    OBJECTIVE Patient Vitals for the past 24 hrs:  BP Temp Temp src Pulse Resp SpO2 Height Weight  01/18/18 0312 110/64 98 F (36.7 C) - 76 17 100 % - -  01/17/18 2040 119/69 (!) 97.5 F (36.4 C) Oral 69 18 - 5\' 6"  (1.676 m) 95.6 kg   Constitutional: Well-developed, well-nourished female in no acute distress.  Cardiovascular: normal rate & rhythm, no murmur Respiratory: normal rate and effort. Lung sounds clear throughout GI: TTP in LLQ. Abd soft, Decreased BS. No guarding or rebound tenderness MS: Extremities nontender, no edema, normal ROM Neurologic: Alert and oriented x 4.     LAB RESULTS Results for orders placed or performed during the hospital encounter of 01/17/18 (from the past 24 hour(s))  CBC     Status: None   Collection Time: 01/17/18  9:25 PM  Result Value Ref Range   WBC 7.9 4.0 - 10.5 K/uL   RBC 4.35 3.87 - 5.11 MIL/uL   Hemoglobin 12.5 12.0 - 15.0 g/dL   HCT 16.1 09.6 - 04.5 %   MCV 87.6 80.0 - 100.0 fL   MCH 28.7 26.0 - 34.0 pg   MCHC 32.8 30.0 - 36.0 g/dL   RDW 40.9 81.1 - 91.4 %   Platelets 231 150 - 400 K/uL   nRBC 0.0 0.0 - 0.2 %  hCG, quantitative, pregnancy     Status: Abnormal   Collection Time: 01/17/18  9:25 PM  Result Value Ref Range   hCG, Beta Chain, Quant, S 137 (H) <5 mIU/mL    IMAGING US Ob Transvaginal  Result Date: 01/17/2018 CLINICAL DATA:  Pain after methotrexate on Wednesday. Some blood clots passed today. Known left ectopic pregnancy. EXAM: TRANSVAGINAL OB ULTRASOUND TECHNIQUE: Transvaginal ultrasound was performed for complete evaluation of the gestation as well as the maternal uterus, adnexal regions, and pelvic cul-de-sac. COMPARISON:  01/17/2018 at 0011 hours FINDINGS: Intrauterine gestational sac: None Yolk sac:  Not Visualized. Embryo:  Not Visualized. Cardiac Activity: Not Visualized. Heart Rate: Not applicable MSD: Not applicable CRL: Not applicable  Subchorionic hemorrhage:  Not applicable Maternal uterus/adnexae: Unremarkable ovaries with the right measuring 2.7 x 1.3 x 1.4 cm and the left measuring 2.4 x 1.5 x 2 cm. Redemonstration of left adnexal soft tissue suspicious for stigmata of an ectopic pregnancy now measures approximately 2.3 x 2.4 x 2.8 cm increased from 1.9 cm earlier. Trace complex fluid in the cul-de-sac. IMPRESSION: Allowing for operator dependent imaging differences, the 1.9 cm soft tissue focus in the left  adnexa is slightly more prominent at 2.3 x 2.4 x 2.8 cm currently. Trace hypoechoic complex free fluid the cul-de-sac. Findings may represent stigmata of an ectopic pregnancy on the left. No evidence of active hemorrhage or rupture. No intrauterine gestation.  Thin endometrial cavity. Electronically Signed   By: Tollie Ethavid  Kwon M.D.   On: 01/17/2018 22:15    MAU COURSE Orders Placed This Encounter  Procedures  . US OB Transvaginal  . CBC  . hCG, quantitative, pregnancy  . Diet NPO time specified  . Orthostatic vital signs  . Discharge patient   Meds ordered this encounter  Medications  . lactated ringers infusion  . DISCONTD: HYDROmorphone (DILAUDID) injection 1 mg  . DISCONTD: ketorolac (TORADOL) 30 MG/ML injection 30 mg  . ketorolac (TORADOL) injection 60 mg  . HYDROmorphone (DILAUDID) injection 1 mg  . ibuprofen (ADVIL,MOTRIN) 600 MG tablet    Sig: Take 1 tablet (600 mg total) by mouth every 6 (six) hours as needed for moderate pain.    Dispense:  30 tablet    Refill:  0    Order Specific Question:   Supervising Provider    Answer:   Adam PhenixARNOLD, JAMES G [[id number]]    MDM VSS, pt not orthostatic HCG down to 137 from 208 last night Ultrasound - right adnexal mass now measuring 2.3 cm which is increased from last night. No evidence of rupture.  Dr. Debroah LoopArnold in to see patient. Will give dilaudid & toradol (given IM since RN unable to gain IV access).  Pt reports improvement in pain, down to 3/10.  Per Dr. Debroah LoopArnold. Pt to  be discharged home & to keep her f/u HCG appt on Tuesday.   Patient & spouse continue to have questions at time of discharge & pt requesting rx for pain medication. Dr. Debroah LoopArnold at bedside again to discuss tonight's assessment & plan for patient. Will rx ibuprofen.  ASSESSMENT 1. Ectopic pregnancy without intrauterine pregnancy   2. Abdominal pain during pregnancy in first trimester     PLAN Discharge home in stable condition. Ectopic precautions Rx ibuprofen Keep HCG visit next Tuesday Stressed importance of returning to MAU for worsening pain, pain not controlled by medication, and other concerning signs for ruptured ectopic   Follow-up Information    [facility] Follow up.   Why:  If symptoms worsen Contact information: [address] [id number] [place] [id number] [phone]         Allergies as of 01/18/2018   No Known Allergies     Medication List    TAKE these medications   acetaminophen 500 MG tablet Commonly known as:  TYLENOL Take 1,000 mg by mouth every 6 (six) hours as needed for mild pain or moderate pain.   ibuprofen 600 MG tablet Commonly known as:  ADVIL,MOTRIN Take 1 tablet (600 mg total) by mouth every 6 (six) hours as needed for moderate pain.   promethazine 25 MG tablet Commonly known as:  PHENERGAN Take 1 tablet (25 mg total) by mouth every 6 (six) hours as needed for nausea or vomiting.        Judeth HornLawrence, Sanela Evola, NP 01/18/2018  3:22 AM

## 2018-01-17 NOTE — Discharge Instructions (Signed)
Methotrexate Treatment for an Ectopic Pregnancy °Methotrexate is a medicine that treats a pregnancy condition in which the fetus develops outside the uterus (ectopic pregnancy) by stopping the growth of the fertilized egg. It also helps your body absorb tissue from the egg. This takes between 2 weeks and 6 weeks. Most ectopic pregnancies can be successfully treated with methotrexate if they are detected early enough. °Tell a health care provider about: °· Any allergies you have. °· All medicines you are taking, including vitamins, herbs, eye drops, creams, and over-the-counter medicines. °· Any medical conditions you have. °What are the risks? °Generally, this is a safe treatment. However, problems can occur, including: °· Nausea. °· Vomiting. °· Diarrhea. °· Abdominal cramping. °· Mouth sores. °· Increased vaginal bleeding or spotting. °· Swelling or irritation of the lining of your lungs (pneumonitis). °· Failed treatment and continuation of the pregnancy. °· Liver damage. °· Hair loss. ° °There is still a risk of the ectopic pregnancy rupturing while using the methotrexate. °What happens before the procedure? °Before you take the medicine: °· Liver tests, kidney tests, and a complete blood test are performed. °· Blood tests are performed to measure the pregnancy hormone levels and to determine your blood type. °· If you are Rh-negative and the father is Rh-positive or his Rh type is not known, you will be given a Rho (D) immune globulin shot. ° °What happens during the procedure? °There are two methods that your health care provider may use to give you methotrexate. °· One method involves a single dose or injection of the medicine. °· Another method involves a series of doses given through several injections. ° °What happens after the procedure? °· You may have some abdominal cramping, vaginal bleeding, and fatigue in the first few days after taking methotrexate. °· Blood tests will be taken for several weeks to  check the pregnancy hormone levels. The blood tests are performed until there is no more pregnancy hormone detected in the blood. °This information is not intended to replace advice given to you by your health care provider. Make sure you discuss any questions you have with your health care provider. °Document Released: 01/25/2001 Document Revised: 07/09/2015 Document Reviewed: 11/19/2012 °Elsevier Interactive Patient Education © 2017 [employer] Inc[employer] ° °

## 2018-01-18 MED ORDER — KETOROLAC TROMETHAMINE 60 MG/2ML IM SOLN
60.0000 mg | Freq: Once | INTRAMUSCULAR | Status: AC
  Administered 2018-01-18: 60 mg via INTRAMUSCULAR
  Filled 2018-01-18: qty 2

## 2018-01-18 MED ORDER — HYDROMORPHONE HCL 1 MG/ML IJ SOLN: 1.0000 mg | Freq: Once | INTRAMUSCULAR | Status: DC

## 2018-01-18 MED ORDER — LACTATED RINGERS IV SOLN: INTRAVENOUS | Status: DC

## 2018-01-18 MED ORDER — IBUPROFEN 600 MG PO TABS: 600.0000 mg | ORAL_TABLET | Freq: Four times a day (QID) | ORAL | 0 refills | Status: DC | PRN

## 2018-01-18 MED ORDER — KETOROLAC TROMETHAMINE 30 MG/ML IJ SOLN: 30.0000 mg | Freq: Once | INTRAMUSCULAR | Status: DC

## 2018-01-18 MED ORDER — HYDROMORPHONE HCL 1 MG/ML IJ SOLN
1.0000 mg | Freq: Once | INTRAMUSCULAR | Status: AC
  Administered 2018-01-18: 1 mg via INTRAMUSCULAR
  Filled 2018-01-18: qty 1

## 2018-01-18 NOTE — Discharge Instructions (Signed)
Return to care   If you have heavier bleeding that soaks through more that 2 pads per hour for an hour or more  If you bleed so much that you feel like you might pass out or you do pass out  If you have significant abdominal pain that is not improved with Tylenol or ibuprofen      Ectopic Pregnancy An ectopic pregnancy is when the fertilized egg attaches (implants) outside the uterus. Most ectopic pregnancies occur in one of the tubes where eggs travel from the ovary to the uterus (fallopian tubes), but the implanting can occur in other locations. In rare cases, ectopic pregnancies occur on the ovary, intestine, pelvis, abdomen, or cervix. In an ectopic pregnancy, the fertilized egg does not have the ability to develop into a normal, healthy baby. A ruptured ectopic pregnancy is one in which tearing or bursting of a fallopian tube causes internal bleeding. Often, there is intense lower abdominal pain, and vaginal bleeding sometimes occurs. Having an ectopic pregnancy can be life-threatening. If this dangerous condition is not treated, it can lead to blood loss, shock, or even death. What are the causes? The most common cause of this condition is damage to one of the fallopian tubes. A fallopian tube may be narrowed or blocked, and that keeps the fertilized egg from reaching the uterus. What increases the risk? This condition is more likely to develop in women of childbearing age who have different levels of risk. The levels of risk can be divided into three categories. High risk  You have gone through infertility treatment.  You have had an ectopic pregnancy before.  You have had surgery on the fallopian tubes, or another surgical procedure, such as an abortion.  You have had surgery to have the fallopian tubes tied (tubal ligation).  You have problems or diseases of the fallopian tubes.  You have been exposed to diethylstilbestrol (DES). This medicine was used until 1971, and it had  effects on babies whose mothers took the medicine.  You become pregnant while using an IUD (intrauterine device) for birth control. Moderate risk  You have a history of infertility.  You have had an STI (sexually transmitted infection).  You have a history of pelvic inflammatory disease (PID).  You have scarring from endometriosis.  You have multiple sexual partners.  You smoke. Low risk  You have had pelvic surgery.  You use vaginal douches.  You became sexually active before age 26. What are the signs or symptoms? Common symptoms of this condition include normal pregnancy symptoms, such as missing a period, nausea, tiredness, abdominal pain, breast tenderness, and bleeding. However, ectopic pregnancy will have additional symptoms, such as:  Pain with intercourse.  Irregular vaginal bleeding or spotting.  Cramping or pain on one side or in the lower abdomen.  Fast heartbeat, low blood pressure, and sweating.  Passing out while having a bowel movement.  Symptoms of a ruptured ectopic pregnancy and internal bleeding may include:  Sudden, severe pain in the abdomen and pelvis.  Dizziness, weakness, light-headedness, or fainting.  Pain in the shoulder or neck area.  How is this diagnosed? This condition is diagnosed by:  A pelvic exam to locate pain or a mass in the abdomen.  A pregnancy test. This blood test checks for the presence as well as the specific level of pregnancy hormone in the bloodstream.  Ultrasound. This is performed if a pregnancy test is positive. In this test, a probe is inserted into the vagina. The  probe will detect a fetus, possibly in a location other than the uterus.  Taking a sample of uterus tissue (dilation and curettage, or D&C).  Surgery to perform a visual exam of the inside of the abdomen using a thin, lighted tube that has a tiny camera on the end (laparoscope).  Culdocentesis. This procedure involves inserting a needle at the top  of the vagina, behind the uterus. If blood is present in this area, it may indicate that a fallopian tube is torn.  How is this treated? This condition is treated with medicine or surgery. Medicine  An injection of a medicine (methotrexate) may be given to cause the pregnancy tissue to be absorbed. This medicine may save your fallopian tube. It may be given if: ? The diagnosis is made early, with no signs of active bleeding. ? The fallopian tube has not ruptured. ? You are considered to be a good candidate for the medicine. Usually, pregnancy hormone blood levels are checked after methotrexate treatment. This is to be sure that the medicine is effective. It may take 4-6 weeks for the pregnancy to be absorbed. Most pregnancies will be absorbed by 3 weeks. Surgery  A laparoscope may be used to remove the pregnancy tissue.  If severe internal bleeding occurs, a larger cut (incision) may be made in the lower abdomen (laparotomy) to remove the fetus and placenta. This is done to stop the bleeding.  Part or all of the fallopian tube may be removed (salpingectomy) along with the fetus and placenta. The fallopian tube may also be repaired during the surgery.  In very rare circumstances, removal of the uterus (hysterectomy) may be required.  After surgery, pregnancy hormone testing may be done to be sure that there is no pregnancy tissue left. Whether your treatment is medicine or surgery, you may receive a Rho (D) immune globulin shot to prevent problems with any future pregnancy. This shot may be given if:  You are Rh-negative and the baby's father is Rh-positive.  You are Rh-negative and you do not know the Rh type of the baby's father.  Follow these instructions at home:  Rest and limit your activity after the procedure for as long as told by your health care provider.  Until your health care provider says that it is safe: ? Do not lift anything that is heavier than 10 lb (4.5 kg), or the  limit that your health care provider tells you. ? Avoid physical exercise and any movement that requires effort (is strenuous).  To help prevent constipation: ? Eat a healthy diet that includes fruits, vegetables, and whole grains. ? Drink 6-8 glasses of water per day. Get help right away if:  You develop worsening pain that is not relieved by medicine.  You have: ? A fever or chills. ? Vaginal bleeding. ? Redness and swelling at the incision site. ? Nausea and vomiting.  You feel dizzy or weak.  You feel light-headed or you faint. This information is not intended to replace advice given to you by your health care provider. Make sure you discuss any questions you have with your health care provider. Document Released: 03/10/2004 Document Revised: 09/30/2015 Document Reviewed: 09/02/2015 Elsevier Interactive Patient Education  [employer]2018 [employer]

## 2018-01-19 ENCOUNTER — Ambulatory Visit (INDEPENDENT_AMBULATORY_CARE_PROVIDER_SITE_OTHER): Payer: BLUE CROSS/BLUE SHIELD | Admitting: Obstetrics and Gynecology

## 2018-01-19 ENCOUNTER — Encounter: Payer: Self-pay | Admitting: Obstetrics and Gynecology

## 2018-01-19 VITALS — BP 115/78 | HR 74 | Ht 66.0 in | Wt 212.4 lb

## 2018-01-19 DIAGNOSIS — O00102 Left tubal pregnancy without intrauterine pregnancy: Secondary | ICD-10-CM

## 2018-01-19 NOTE — Progress Notes (Signed)
Pt states is not in any pain at this time.

## 2018-01-19 NOTE — Progress Notes (Signed)
32 yo [id number] s/p MTX on 11/27 presenting today for follow up. Patient was seen in MAU due to severe abdominal pain. Pelvic ultrasound performed did not demonstrate a ruptured ectopic. Patient reports significant improvement in her pain since her MAU visit. She takes ibuprofen as needed for cramping pain.  Past Medical History:  Diagnosis Date  . Medical history non-contributory    Past Surgical History:  Procedure Laterality Date  . LAPAROSCOPY N/A 01/16/2017   Procedure: LAPAROSCOPY OPERATIVE, RIGHT SALPINGECTOMY;  Surgeon: Tereso Newcomer, MD;  Location: [facility];  Service: Gynecology;  Laterality: N/A;   No family history on file. Social History   Tobacco Use  . Smoking status: Never Smoker  . Smokeless tobacco: Never Used  Substance Use Topics  . Alcohol use: No  . Drug use: No   ROS See pertinent in HPI  Blood pressure 115/78, pulse 74, height 5\' 6"  (1.676 m), weight 212 lb 6.4 oz (96.3 kg), last menstrual period 11/17/2017, unknown if currently breastfeeding. GENERAL: Well-developed, well-nourished female in no acute distress.  ABDOMEN: Soft, nontender, nondistended. No organomegaly. EXTREMITIES: No cyanosis, clubbing, or edema, 2+ distal pulses.  US Ob Transvaginal  Result Date: 01/17/2018 CLINICAL DATA:  Pain after methotrexate on Wednesday. Some blood clots passed today. Known left ectopic pregnancy. EXAM: TRANSVAGINAL OB ULTRASOUND TECHNIQUE: Transvaginal ultrasound was performed for complete evaluation of the gestation as well as the maternal uterus, adnexal regions, and pelvic cul-de-sac. COMPARISON:  01/17/2018 at 0011 hours FINDINGS: Intrauterine gestational sac: None Yolk sac:  Not Visualized. Embryo:  Not Visualized. Cardiac Activity: Not Visualized. Heart Rate: Not applicable MSD: Not applicable CRL: Not applicable Subchorionic hemorrhage:  Not applicable Maternal uterus/adnexae: Unremarkable ovaries with the right measuring 2.7 x 1.3 x 1.4 cm and the left measuring  2.4 x 1.5 x 2 cm. Redemonstration of left adnexal soft tissue suspicious for stigmata of an ectopic pregnancy now measures approximately 2.3 x 2.4 x 2.8 cm increased from 1.9 cm earlier. Trace complex fluid in the cul-de-sac. IMPRESSION: Allowing for operator dependent imaging differences, the 1.9 cm soft tissue focus in the left adnexa is slightly more prominent at 2.3 x 2.4 x 2.8 cm currently. Trace hypoechoic complex free fluid the cul-de-sac. Findings may represent stigmata of an ectopic pregnancy on the left. No evidence of active hemorrhage or rupture. No intrauterine gestation.  Thin endometrial cavity. Electronically Signed   By: Tollie Eth M.D.   On: 01/17/2018 22:15   US Ob Transvaginal  Result Date: 01/17/2018 CLINICAL DATA:  Pain after methotrexate EXAM: TRANSVAGINAL OB ULTRASOUND TECHNIQUE: Transvaginal ultrasound was performed for complete evaluation of the gestation as well as the maternal uterus, adnexal regions, and pelvic cul-de-sac. COMPARISON:  12/21/2017 FINDINGS: Intrauterine gestational sac: None Yolk sac:  Not visualized Embryo:  Not visualized Cardiac Activity: Heart Rate:  bpm MSD:   mm    w     d CRL:     mm    w  d                  [place] EDC: Subchorionic hemorrhage:  None visualized. Maternal uterus/adnexae: Soft tissue area noted adjacent to the left ovary measuring 1.9 x 1.7 x 1.1 cm of unknown etiology. This conceivably could reflect an ectopic pregnancy. No free fluid. IMPRESSION: No intrauterine gestation visualized. Area of soft tissue separate from the left ovary in the left adnexa measuring up to 1.9 cm. Cannot exclude ectopic pregnancy. Electronically Signed   By: Charlett Nose M.D.  On: 01/17/2018 00:36   [place] Ob Less Than 14 Weeks With Ob Transvaginal  Addendum Date: 12/21/2017   ADDENDUM REPORT: 12/21/2017 17:24 ADDENDUM: Corrected report: FINDINGS: Intrauterine gestational sac: None. Yolk sac: Not visualized. Embryo: Not visualized. Cardiac activity: Not visualized.  Electronically Signed   By: Norva PavlovElizabeth  Brown M.D.   On: 12/21/2017 17:24   Result Date: 12/21/2017 CLINICAL DATA:  Vaginal bleeding in the first trimester. Quantitative beta HCG is 643. LMP was 11/17/2017. By LMP gestational age is 4 weeks 6 days. EDC by LMP is 08/24/2018. EXAM: OBSTETRIC <14 WK US AND TRANSVAGINAL OB US TECHNIQUE: Both transabdominal and transvaginal ultrasound examinations were performed for complete evaluation of the gestation as well as the maternal uterus, adnexal regions, and pelvic cul-de-sac. Transvaginal technique was performed to assess early pregnancy. COMPARISON:  05/31/2017 FINDINGS: Intrauterine gestational sac: None Yolk sac:  Visualized. Embryo:  Visualized. Cardiac Activity: Not Visualized. Subchorionic hemorrhage:  None visualized. Maternal uterus/adnexae: Normal in appearance. No free pelvic fluid. IMPRESSION: 1.  Pregnancy of unknown location. Considerations include completed spontaneous abortion, early intrauterine pregnancy or early ectopic pregnancy. Serial quantitative beta HCG values and follow-up ultrasound are recommended as appropriate to document progression of and location of pregnancy. Ectopic pregnancy has not been excluded. 2. No adnexal mass. Electronically Signed: By: Norva PavlovElizabeth  Brown M.D. On: 12/21/2017 15:23   Results for orders placed or performed during the hospital encounter of 01/17/18 (from the past 72 hour(s))  CBC     Status: None   Collection Time: 01/17/18  9:25 PM  Result Value Ref Range   WBC 7.9 4.0 - 10.5 K/uL   RBC 4.35 3.87 - 5.11 MIL/uL   Hemoglobin 12.5 12.0 - 15.0 g/dL   HCT 96.038.1 45.436.0 - 09.846.0 %   MCV 87.6 80.0 - 100.0 fL   MCH 28.7 26.0 - 34.0 pg   MCHC 32.8 30.0 - 36.0 g/dL   RDW 11.912.8 14.711.5 - 82.915.5 %   Platelets 231 150 - 400 K/uL   nRBC 0.0 0.0 - 0.2 %    Comment: Performed at [facility], [address] [place], [place] [address]  hCG, quantitative, pregnancy     Status: Abnormal   Collection Time: 01/17/18  9:25 PM   Result Value Ref Range   hCG, Beta Chain, Quant, S 137 (H) <5 mIU/mL    Comment:          GEST. AGE      CONC.  (mIU/mL)   <=1 WEEK        5 - 50     2 WEEKS       50 - 500     3 WEEKS       100 - 10,000     4 WEEKS     1,000 - 30,000     5 WEEKS     3,500 - 115,000   6-8 WEEKS     12,000 - 270,000    12 WEEKS     15,000 - 220,000        FEMALE AND NON-PREGNANT FEMALE:     LESS THAN 5 mIU/mL Performed at [facility], [address] [place], [place] [address]      A/P 32 yo here for follow up s/p MTX - Drop in HCG from 208 on 12/3 to 137 on 12/4 - Patient to follow up as scheduled on 12/10 for quant HCG - Patient asked to continue monitoring symptoms and to return sooner with worsening pain

## 2018-01-21 ENCOUNTER — Encounter (HOSPITAL_COMMUNITY): Payer: Self-pay | Admitting: Emergency Medicine

## 2018-01-21 ENCOUNTER — Encounter (HOSPITAL_COMMUNITY): Payer: Self-pay | Admitting: Anesthesiology

## 2018-01-21 ENCOUNTER — Inpatient Hospital Stay (HOSPITAL_COMMUNITY)
Admission: AD | Admit: 2018-01-21 | Discharge: 2018-01-21 | Disposition: A | Discharge status: Home / Self Care | Payer: BLUE CROSS/BLUE SHIELD | Source: Ambulatory Visit | Attending: Obstetrics and Gynecology | Admitting: Obstetrics and Gynecology

## 2018-01-21 ENCOUNTER — Inpatient Hospital Stay (HOSPITAL_COMMUNITY): Payer: BLUE CROSS/BLUE SHIELD

## 2018-01-21 DIAGNOSIS — O26891 Other specified pregnancy related conditions, first trimester: Secondary | ICD-10-CM

## 2018-01-21 DIAGNOSIS — O009 Unspecified ectopic pregnancy without intrauterine pregnancy: Secondary | ICD-10-CM | POA: Insufficient documentation

## 2018-01-21 DIAGNOSIS — R1032 Left lower quadrant pain: Secondary | ICD-10-CM | POA: Diagnosis present

## 2018-01-21 DIAGNOSIS — R109 Unspecified abdominal pain: Secondary | ICD-10-CM | POA: Diagnosis not present

## 2018-01-21 DIAGNOSIS — O26899 Other specified pregnancy related conditions, unspecified trimester: Secondary | ICD-10-CM

## 2018-01-21 LAB — COMPREHENSIVE METABOLIC PANEL
ALT: 19 U/L (ref 0–44)
AST: 16 U/L (ref 15–41)
Albumin: 3.2 g/dL — ABNORMAL LOW (ref 3.5–5.0)
Alkaline Phosphatase: 73 U/L (ref 38–126)
Anion gap: 8 (ref 5–15)
BUN: 8 mg/dL (ref 6–20)
CO2: 26 mmol/L (ref 22–32)
Calcium: 8.4 mg/dL — ABNORMAL LOW (ref 8.9–10.3)
Chloride: 103 mmol/L (ref 98–111)
Creatinine, Ser: 0.59 mg/dL (ref 0.44–1.00)
GFR calc Af Amer: >60 mL/min (ref >60)
GFR calc non Af Amer: >60 mL/min (ref >60)
Glucose, Bld: 104 mg/dL — ABNORMAL HIGH (ref 70–99)
Potassium: 4 mmol/L (ref 3.5–5.1)
Sodium: 137 mmol/L (ref 135–145)
TOTAL PROTEIN: 6.5 g/dL (ref 6.5–8.1)
Total Bilirubin: 0.6 mg/dL (ref 0.3–1.2)

## 2018-01-21 LAB — CBC WITH DIFFERENTIAL/PLATELET
Basophils Absolute: 0 10*3/uL (ref 0.0–0.1)
Basophils Relative: 0 %
Eosinophils Absolute: 0.1 10*3/uL (ref 0.0–0.5)
Eosinophils Relative: 2 %
HEMATOCRIT: 36.6 % (ref 36.0–46.0)
Hemoglobin: 12.2 g/dL (ref 12.0–15.0)
Lymphocytes Relative: 53 %
Lymphs Abs: 4 10*3/uL (ref 0.7–4.0)
MCH: 29 pg (ref 26.0–34.0)
MCHC: 33.3 g/dL (ref 30.0–36.0)
MCV: 87.1 fL (ref 80.0–100.0)
MONO ABS: 0.4 10*3/uL (ref 0.1–1.0)
Monocytes Relative: 5 %
NEUTROS ABS: 3 10*3/uL (ref 1.7–7.7)
Neutrophils Relative %: 40 %
Platelets: 291 10*3/uL (ref 150–400)
RBC: 4.2 MIL/uL (ref 3.87–5.11)
RDW: 13.1 % (ref 11.5–15.5)
WBC: 7.6 10*3/uL (ref 4.0–10.5)
nRBC: 0 % (ref 0.0–0.2)

## 2018-01-21 LAB — HCG, QUANTITATIVE, PREGNANCY: hCG, Beta Chain, Quant, S: 33 m[IU]/mL — ABNORMAL HIGH (ref <5)

## 2018-01-21 LAB — TYPE AND SCREEN
ABO/RH(D): O POS
Antibody Screen: NEGATIVE

## 2018-01-21 MED ORDER — KETOROLAC TROMETHAMINE 10 MG PO TABS: 10.0000 mg | ORAL_TABLET | Freq: Four times a day (QID) | ORAL | 0 refills | Status: DC | PRN

## 2018-01-21 MED ORDER — HYDROMORPHONE HCL 1 MG/ML IJ SOLN
1.0000 mg | Freq: Once | INTRAMUSCULAR | Status: AC
  Administered 2018-01-21: 1 mg via INTRAVENOUS
  Filled 2018-01-21: qty 1

## 2018-01-21 MED ORDER — ONDANSETRON 8 MG PO TBDP: 8.0000 mg | ORAL_TABLET | Freq: Three times a day (TID) | ORAL | 0 refills | Status: DC | PRN

## 2018-01-21 MED ORDER — SODIUM CHLORIDE 0.9 % IV SOLN
8.0000 mg | Freq: Once | INTRAVENOUS | Status: AC
  Administered 2018-01-21: 8 mg via INTRAVENOUS
  Filled 2018-01-21: qty 4

## 2018-01-21 MED ORDER — OXYCODONE-ACETAMINOPHEN 5-325 MG PO TABS
2.0000 | ORAL_TABLET | Freq: Once | ORAL | Status: AC
  Administered 2018-01-21: 2 via ORAL
  Filled 2018-01-21: qty 2

## 2018-01-21 MED ORDER — LACTATED RINGERS IV SOLN
INTRAVENOUS | Status: DC
  Administered 2018-01-21: 03:00:00 via INTRAVENOUS

## 2018-01-21 MED ORDER — KETOROLAC TROMETHAMINE 30 MG/ML IJ SOLN
30.0000 mg | Freq: Once | INTRAMUSCULAR | Status: AC
  Administered 2018-01-21: 30 mg via INTRAVENOUS
  Filled 2018-01-21: qty 1

## 2018-01-21 MED ORDER — OXYCODONE-ACETAMINOPHEN 5-325 MG PO TABS: 1.0000 | ORAL_TABLET | ORAL | 0 refills | Status: DC | PRN

## 2018-01-21 NOTE — MAU Provider Note (Signed)
History     CSN: [id number]  Arrival date and time: 01/21/18 0148   First Provider Initiated Contact with Patient 01/21/18 0248      Chief Complaint  Patient presents with  . Abdominal Pain   HPI   Ms.Nichoel Bischoff is a 32 y.o. female here with complaints of worsening abdominal pain. She was recently diagnosed with an ectopic pregnancy and received MTX on 11/27. She has been back and fourth to MAU with increased pain. Today she says the pain because worse in her LLQ. She took ibuprofen which did not help.   OB History    Gravida  4   Para      Term      Preterm      AB  3   Living        SAB  2   TAB      Ectopic  1   Multiple      Live Births              Past Medical History:  Diagnosis Date  . Medical history non-contributory     Past Surgical History:  Procedure Laterality Date  . LAPAROSCOPY N/A 01/16/2017   Procedure: LAPAROSCOPY OPERATIVE, RIGHT SALPINGECTOMY;  Surgeon: Tereso Newcomer, MD;  Location: [facility];  Service: Gynecology;  Laterality: N/A;    History reviewed. No pertinent family history.  Social History   Tobacco Use  . Smoking status: Never Smoker  . Smokeless tobacco: Never Used  Substance Use Topics  . Alcohol use: No  . Drug use: No    Allergies: No Known Allergies  Medications Prior to Admission  Medication Sig Dispense Refill Last Dose  . acetaminophen (TYLENOL) 500 MG tablet Take 1,000 mg by mouth every 6 (six) hours as needed for mild pain or moderate pain.   Taking  . ibuprofen (ADVIL,MOTRIN) 600 MG tablet Take 1 tablet (600 mg total) by mouth every 6 (six) hours as needed for moderate pain. 30 tablet 0 Taking   Results for orders placed or performed during the hospital encounter of 01/21/18 (from the past 48 hour(s))  CBC with Differential     Status: None   Collection Time: 01/21/18  2:40 AM  Result Value Ref Range   WBC 7.6 4.0 - 10.5 K/uL   RBC 4.20 3.87 - 5.11 MIL/uL   Hemoglobin 12.2 12.0 - 15.0 g/dL    HCT 40.9 81.1 - 91.4 %   MCV 87.1 80.0 - 100.0 fL   MCH 29.0 26.0 - 34.0 pg   MCHC 33.3 30.0 - 36.0 g/dL   RDW 78.2 95.6 - 21.3 %   Platelets 291 150 - 400 K/uL   nRBC 0.0 0.0 - 0.2 %   Neutrophils Relative % 40 %   Neutro Abs 3.0 1.7 - 7.7 K/uL   Lymphocytes Relative 53 %   Lymphs Abs 4.0 0.7 - 4.0 K/uL   Monocytes Relative 5 %   Monocytes Absolute 0.4 0.1 - 1.0 K/uL   Eosinophils Relative 2 %   Eosinophils Absolute 0.1 0.0 - 0.5 K/uL   Basophils Relative 0 %   Basophils Absolute 0.0 0.0 - 0.1 K/uL    Comment: Performed at [facility], [address], [place], [place] [address]  Type and screen     Status: None   Collection Time: 01/21/18  3:29 AM  Result Value Ref Range   ABO/RH(D) O POS    Antibody Screen NEG    Sample Expiration  01/24/2018 Performed at [facility], [address] [place], [place] [address]   Comprehensive metabolic panel     Status: Abnormal   Collection Time: 01/21/18  3:29 AM  Result Value Ref Range   Sodium 137 135 - 145 mmol/L   Potassium 4.0 3.5 - 5.1 mmol/L   Chloride 103 98 - 111 mmol/L   CO2 26 22 - 32 mmol/L   Glucose, Bld 104 (H) 70 - 99 mg/dL   BUN 8 6 - 20 mg/dL   Creatinine, Ser 6.040.59 0.44 - 1.00 mg/dL   Calcium 8.4 (L) 8.9 - 10.3 mg/dL   Total Protein 6.5 6.5 - 8.1 g/dL   Albumin 3.2 (L) 3.5 - 5.0 g/dL   AST 16 15 - 41 U/L   ALT 19 0 - 44 U/L   Alkaline Phosphatase 73 38 - 126 U/L   Total Bilirubin 0.6 0.3 - 1.2 mg/dL   GFR calc non Af Amer >60 >60 mL/min   GFR calc Af Amer >60 >60 mL/min   Anion gap 8 5 - 15    Comment: Performed at [facility], [address] [place], [place] [address]  hCG, quantitative, pregnancy     Status: Abnormal   Collection Time: 01/21/18  3:29 AM  Result Value Ref Range   hCG, Beta Chain, Quant, S 33 (H) <5 mIU/mL    Comment:          GEST. AGE      CONC.  (mIU/mL)   <=1 WEEK        5 - 50     2 WEEKS       50 - 500     3 WEEKS       100 - 10,000     4 WEEKS     1,000 -  30,000     5 WEEKS     3,500 - 115,000   6-8 WEEKS     12,000 - 270,000    12 WEEKS     15,000 - 220,000        FEMALE AND NON-PREGNANT FEMALE:     LESS THAN 5 mIU/mL Performed at [facility], [address] [place], [place] [address]    [place] Ob Transvaginal  Result Date: 01/21/2018 CLINICAL DATA:  32 year old female with positive but decreasing hCG levels. Previously reported left adnexal mass. Patient was treated with methotrexate. EXAM: TRANSVAGINAL OB ULTRASOUND TECHNIQUE: Transvaginal ultrasound was performed for complete evaluation of the gestation as well as the maternal uterus, adnexal regions, and pelvic cul-de-sac. COMPARISON:  Ultrasound dated 01/17/2018. FINDINGS: The uterus is anteverted and appears unremarkable. The endometrium is unremarkable. No intrauterine pregnancy identified. The right ovary is unremarkable. There is a 1.5 x 1.1 x 1.2 cm (previously 2.3 x 2.4 x 2.8 cm) heterogeneous and echogenic structure adjacent to the left ovary in the region of the left adnexa corresponding to the soft tissue mass seen on the prior ultrasound and concerning for ectopic pregnancy. No significant vascularity noted within this structure. There is a small amount of simple appearing free fluid within the pelvis. IMPRESSION: 1. Left adnexal mass as seen previously and concerning for ectopic pregnancy. 2. No intrauterine pregnancy. Electronically Signed   By: Elgie CollardArash  Radparvar M.D.   On: 01/21/2018 03:59   Review of Systems  Constitutional: Negative for fever.  Gastrointestinal: Positive for abdominal pain, nausea and vomiting.   Physical Exam   Blood pressure 105/73, pulse 78, temperature 98 F (36.7 C), temperature source Axillary, resp.  rate 17, last menstrual period 11/17/2017, SpO2 100 %, unknown if currently breastfeeding.  Physical Exam  Constitutional: She is oriented to person, place, and time. She appears well-developed and well-nourished. No distress.  HENT:  Head:  Normocephalic.  Eyes: Pupils are equal, round, and reactive to light.  GI: Soft. Normal appearance. There is tenderness in the left lower quadrant. There is no rigidity, no rebound and no guarding.  Musculoskeletal: Normal range of motion.  Neurological: She is alert and oriented to person, place, and time.  Skin: Skin is warm. She is not diaphoretic.  Psychiatric: Her behavior is normal.   MAU Course  Procedures  None  MDM  Lr with dilaudid 1 mg IV CBC, CMP, Quant, [place] 0430: Dr. Emelda Fear at bedside to see the patient Toradol 30 mg IV Zofran 8 mg IV Percocet 2 tablets given PO prior to discharge Quant dropped from 137 to 33 and US shows a decrease in size in ectopic. Patient feeling much better since medications.   Assessment and Plan   A:  1. Abdominal pain in pregnancy, antepartum   2. Ectopic pregnancy     P:  Discharge home in stable condition Rx: Percocet, Toradol, zofran Keep your f/u appointment.  Return to MAU if symptoms worsen   [place] Goh, Harolyn Rutherford, NP 01/22/2018 3:38 PM

## 2018-01-23 ENCOUNTER — Other Ambulatory Visit: Payer: BLUE CROSS/BLUE SHIELD

## 2018-01-23 ENCOUNTER — Other Ambulatory Visit: Payer: Self-pay | Admitting: General Practice

## 2018-01-23 DIAGNOSIS — O00109 Unspecified tubal pregnancy without intrauterine pregnancy: Secondary | ICD-10-CM

## 2018-01-24 LAB — BETA HCG QUANT (REF LAB): hCG Quant: 28 m[IU]/mL

## 2018-01-25 ENCOUNTER — Telehealth: Payer: Self-pay | Admitting: Obstetrics and Gynecology

## 2018-01-25 NOTE — Telephone Encounter (Signed)
Attempted to call patient regarding recent quant results. Quant is dropping approprietly. She is to continue weekly quants until >5.   [name], [name], NP 01/25/2018 2:27 PM

## 2018-01-29 ENCOUNTER — Other Ambulatory Visit: Payer: Self-pay | Admitting: *Deleted

## 2018-01-29 ENCOUNTER — Other Ambulatory Visit: Payer: Self-pay

## 2018-01-29 DIAGNOSIS — K5900 Constipation, unspecified: Secondary | ICD-10-CM

## 2018-01-29 DIAGNOSIS — O00102 Left tubal pregnancy without intrauterine pregnancy: Secondary | ICD-10-CM

## 2018-01-29 DIAGNOSIS — O99612 Diseases of the digestive system complicating pregnancy, second trimester: Principal | ICD-10-CM

## 2018-01-29 MED ORDER — DOCUSATE SODIUM 50 MG PO CAPS: 50.0000 mg | ORAL_CAPSULE | Freq: Two times a day (BID) | ORAL | 0 refills | Status: DC

## 2018-01-29 MED ORDER — POLYETHYLENE GLYCOL 3350 17 GM/SCOOP PO POWD: 1.0000 | Freq: Once | ORAL | Status: DC

## 2018-01-30 ENCOUNTER — Other Ambulatory Visit: Payer: BLUE CROSS/BLUE SHIELD

## 2018-01-30 DIAGNOSIS — O00102 Left tubal pregnancy without intrauterine pregnancy: Secondary | ICD-10-CM

## 2018-01-31 LAB — BETA HCG QUANT (REF LAB): hCG Quant: 2 m[IU]/mL

## 2018-10-23 DIAGNOSIS — B379 Candidiasis, unspecified: Secondary | ICD-10-CM

## 2018-10-24 MED ORDER — FLUCONAZOLE 150 MG PO TABS: 150.0000 mg | ORAL_TABLET | Freq: Once | ORAL | 0 refills | Status: AC

## 2019-02-19 IMAGING — US US OB TRANSVAGINAL
1 series · 15 of 28 positions shown · non-contrast
Comparison: Ultrasound dated 01/17/2018.

CLINICAL DATA: 32-year-old female with positive but decreasing hCG
levels. Previously reported left adnexal mass. Patient was treated
with methotrexate.

EXAM:
TRANSVAGINAL OB ULTRASOUND
TECHNIQUE: Transvaginal ultrasound was performed for complete evaluation of the
gestation as well as the maternal uterus, adnexal regions, and
pelvic cul-de-sac.

[Series 1: us ob transvaginal · 31 acquisitions, 15 frames shown]
[im 1/31]
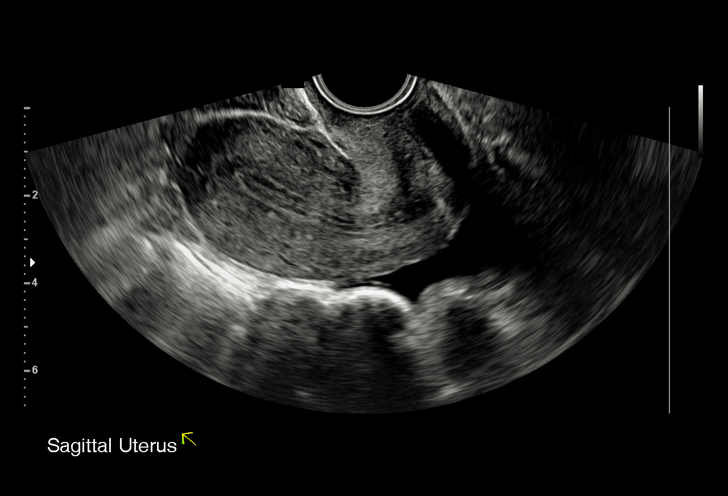
[im 3/31]
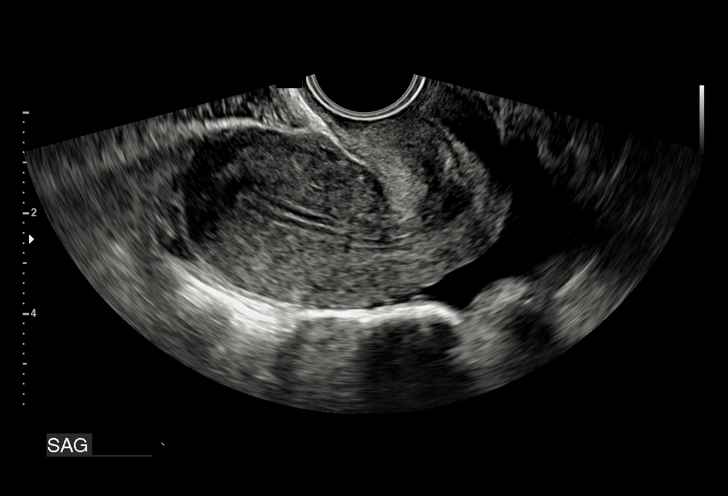
[im 5/31]
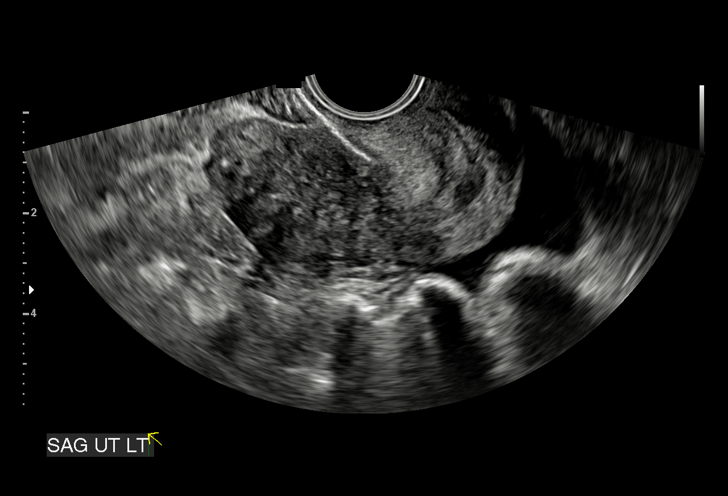
[im 7/31]
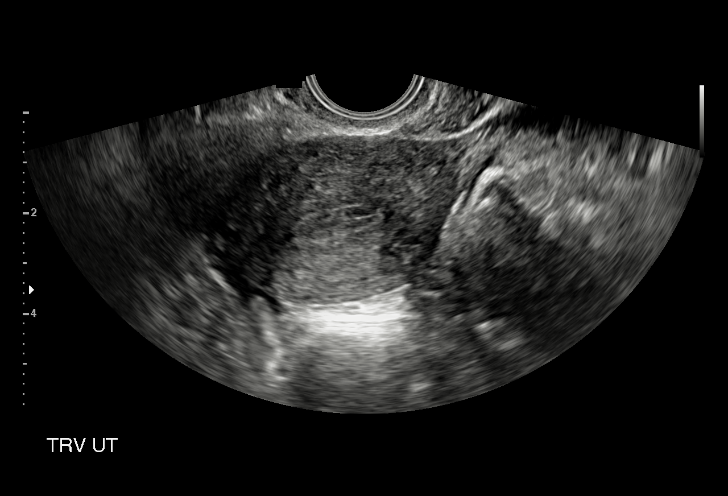
[im 9/31]
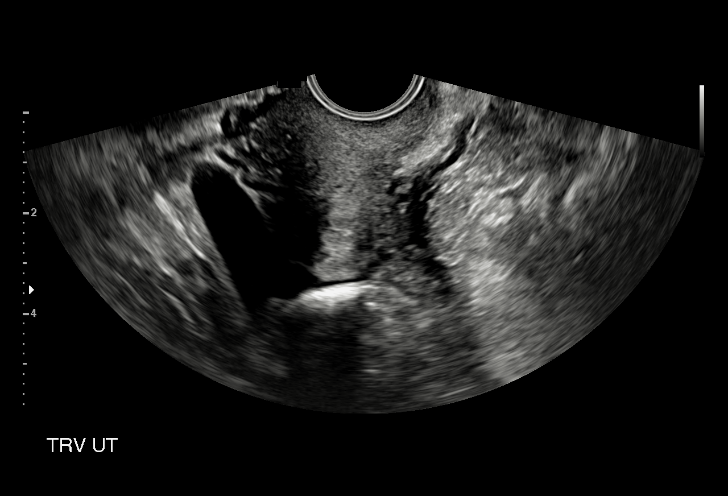
[im 12/31]
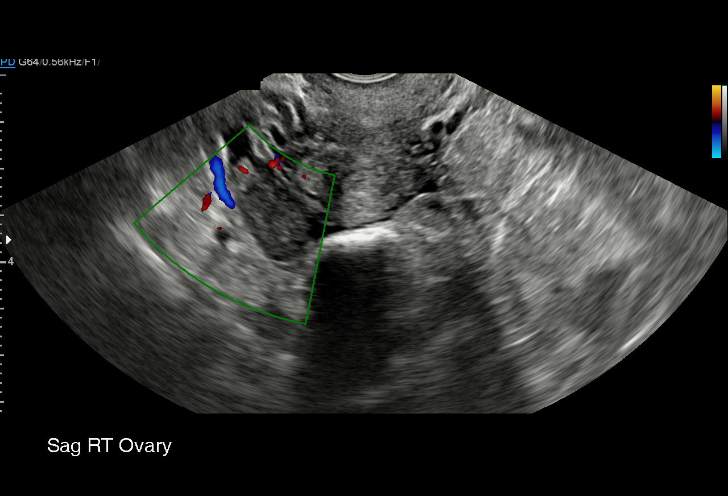
[im 14/31]
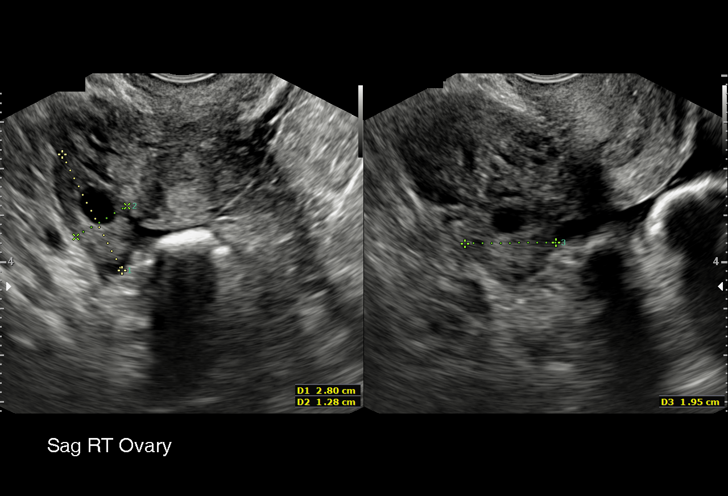
[im 16/31]
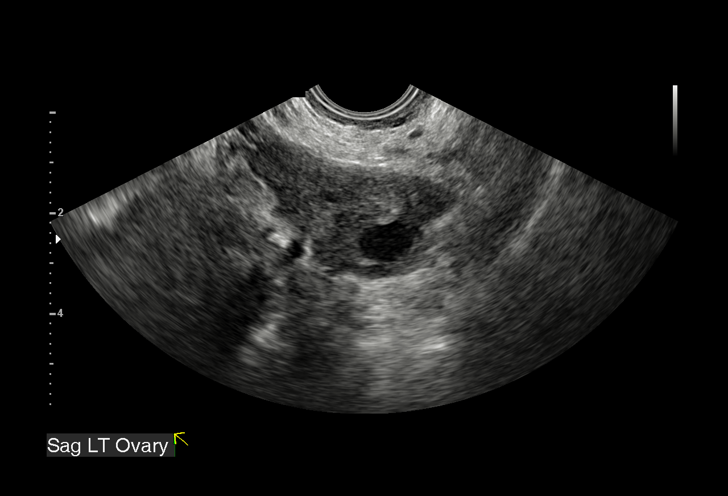
[im 17/31]
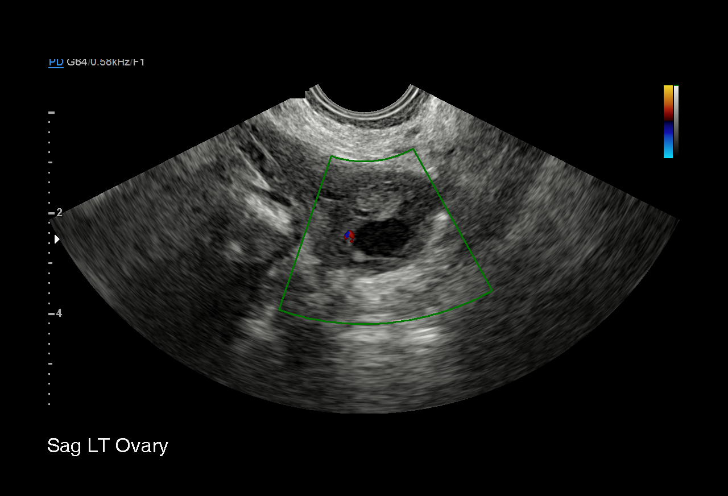
[im 19/31]
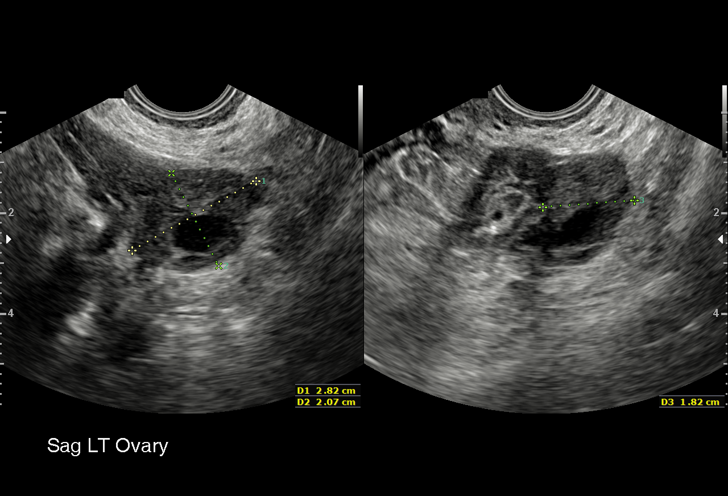
[im 22/31]
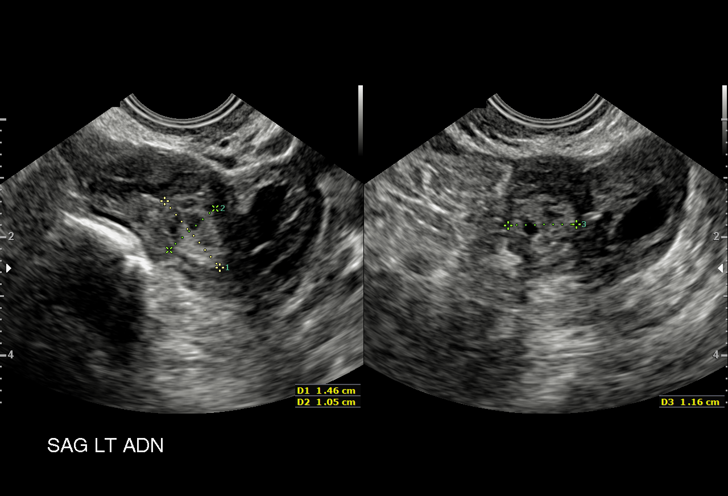
[im 24/31]
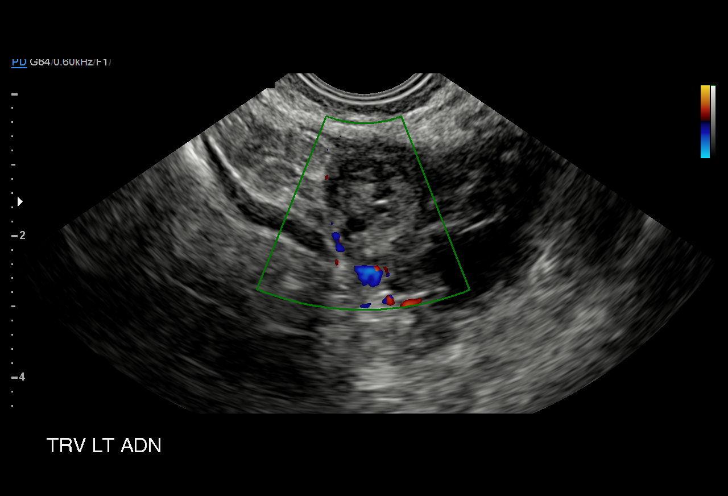
[im 26/31]
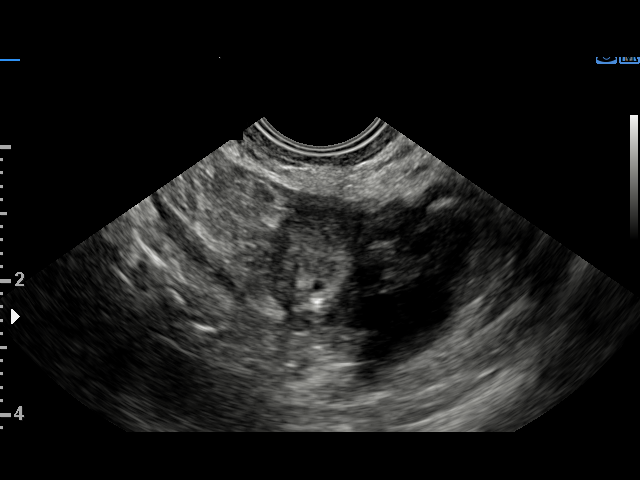
[im 28/31]
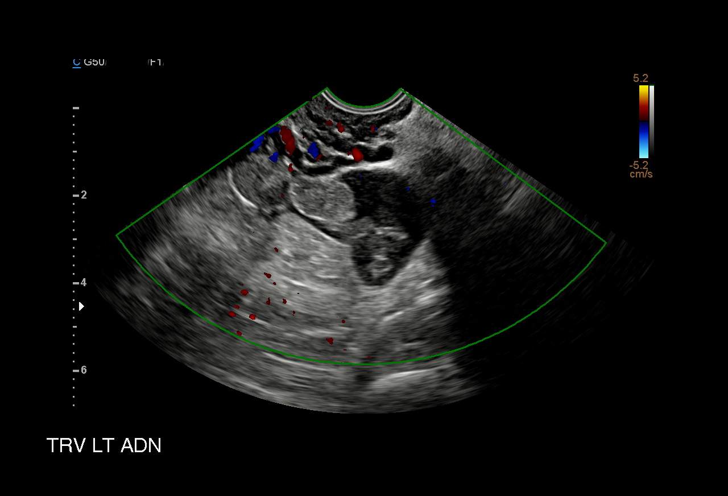
[im 31/31]
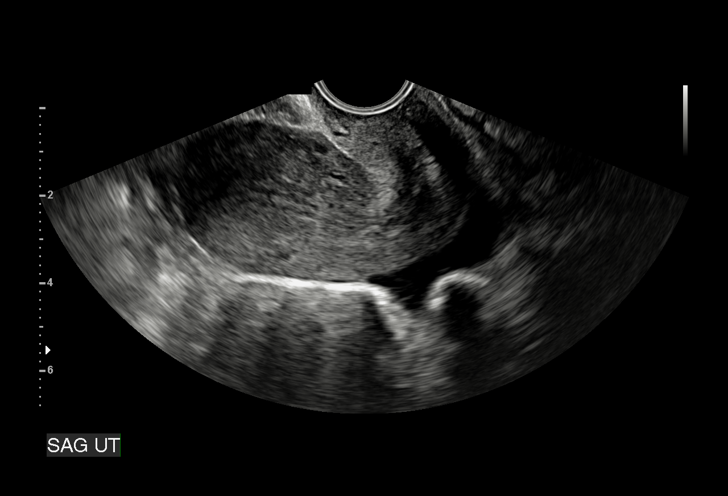

[15 of 28 positions shown; findings below may reference images not displayed]

FINDINGS: The uterus is anteverted and appears unremarkable.

The endometrium is unremarkable. No intrauterine pregnancy
identified.

The right ovary is unremarkable.

There is a 1.5 x 1.1 x 1.2 cm (previously 2.3 x 2.4 x 2.8 cm)
heterogeneous and echogenic structure adjacent to the left ovary in
the region of the left adnexa corresponding to the soft tissue mass
seen on the prior ultrasound and concerning for ectopic pregnancy.
No significant vascularity noted within this structure.

There is a small amount of simple appearing free fluid within the
pelvis.
IMPRESSION: 1. Left adnexal mass as seen previously and concerning for ectopic
pregnancy.
2. No intrauterine pregnancy.

## 2019-03-12 ENCOUNTER — Other Ambulatory Visit: Payer: Self-pay

## 2019-03-12 ENCOUNTER — Ambulatory Visit (INDEPENDENT_AMBULATORY_CARE_PROVIDER_SITE_OTHER): Payer: 59 | Admitting: Adult Health Nurse Practitioner

## 2019-03-12 VITALS — BP 100/70 | HR 87 | Temp 98.0°F | Ht 65.0 in | Wt 215.6 lb

## 2019-03-12 DIAGNOSIS — J309 Allergic rhinitis, unspecified: Secondary | ICD-10-CM

## 2019-03-12 DIAGNOSIS — Z23 Encounter for immunization: Secondary | ICD-10-CM

## 2019-03-12 DIAGNOSIS — H1013 Acute atopic conjunctivitis, bilateral: Secondary | ICD-10-CM | POA: Diagnosis not present

## 2019-03-12 MED ORDER — TRIAMCINOLONE ACETONIDE 0.1 % EX CREA: 1.0000 "application " | TOPICAL_CREAM | Freq: Two times a day (BID) | CUTANEOUS | 0 refills | Status: DC

## 2019-03-12 MED ORDER — OLOPATADINE HCL 0.1 % OP SOLN: 1.0000 [drp] | Freq: Two times a day (BID) | OPHTHALMIC | 2 refills | Status: AC

## 2019-03-12 MED ORDER — CETIRIZINE HCL 10 MG PO TABS: 10.0000 mg | ORAL_TABLET | Freq: Every day | ORAL | 11 refills | Status: DC

## 2019-03-12 MED ORDER — HYDROCORTISONE-ACETIC ACID 1-2 % OT SOLN: 4.0000 [drp] | Freq: Two times a day (BID) | OTIC | 0 refills | Status: AC

## 2019-03-12 MED ORDER — AZELASTINE HCL 0.1 % NA SOLN: 1.0000 | Freq: Two times a day (BID) | NASAL | 1 refills | Status: DC

## 2019-03-12 NOTE — Patient Instructions (Addendum)
If you have lab work done today you will be contacted with your lab results within the next 2 weeks.  If you have not heard from [place] then please contact us. The fastest way to get your results is to register for My Chart.   IF you received an x-ray today, you will receive an invoice from [facility]. Please contact [facility] at [phone] with questions or concerns regarding your invoice.   IF you received labwork today, you will receive an invoice from [place]. Please contact [facility] at [phone] with questions or concerns regarding your invoice.   Our billing staff will not be able to assist you with questions regarding bills from these companies.  You will be contacted with the lab results as soon as they are available. The fastest way to get your results is to activate your My Chart account. Instructions are located on the last page of this paperwork. If you have not heard from [place] regarding the results in 2 weeks, please contact this office.     Allergic Conjunctivitis, Adult  Allergic conjunctivitis is inflammation of the clear membrane that covers the white part of your eye and the inner surface of your eyelid (conjunctiva). The inflammation is caused by allergies. The blood vessels in the conjunctiva become inflamed and this causes the eyes to become red or pink. The eyes often feel itchy. Allergic conjunctivitis cannot be spread from one person to another person (is not contagious). What are the causes? This condition is caused by an allergic reaction. Common causes of an allergic reaction (allergens) include:  Outdoor allergens, such as: ? Pollen. ? Grass and weeds. ? Mold spores.  Indoor allergens, such as: ? Dust. ? Smoke. ? Mold. ? Pet dander. ? Animal hair. What increases the risk? You may be more likely to develop this condition if you have a family history of allergies, such as:  Allergic rhinitis.  Bronchial asthma.  Atopic  dermatitis. What are the signs or symptoms? Symptoms of this condition include eyes that are:  Itchy.  Red.  Watery.  Puffy. Your eyes may also:  Sting or burn.  Have clear drainage coming from them. How is this diagnosed? This condition may be diagnosed by medical history and physical exam. If you have drainage from your eyes, it may be tested to rule out other causes of conjunctivitis. You may also need to see a health care provider who specializes in treating allergies (allergist) or eye conditions (ophthalmologist) for tests to confirm the diagnosis. You may have:  Skin tests to see which allergens are causing your symptoms. These tests involve pricking the skin with a tiny needle and exposing the skin to small amounts of potential allergens to see if your skin reacts.  Blood tests.  Tissue scrapings from your eyelid. These will be examined under a microscope. How is this treated? Treatments for this condition may include:  Cold cloths (compresses) to soothe itching and swelling.  Washing the face to remove allergens.  Eye drops. These may be prescription or over-the-counter. There are several different types. You may need to try different types to see which one works best for you. Your may need: ? Eye drops that block the allergic reaction (antihistamine). ? Eye drops that reduce swelling and irritation (anti-inflammatory). ? Steroid eye drops to lessen a severe reaction (vernal conjunctivitis).  Oral antihistamine medicines to reduce your allergic reaction. You may need these if eye drops do not help or are difficult to use.  Follow these instructions at home:  Avoid known allergens whenever possible.  Take or apply over-the-counter and prescription medicines only as told by your health care provider. These include any eye drops.  Apply a cool, clean washcloth to your eye for 10-20 minutes, 3-4 times a day.  Do not touch or rub your eyes.  Do not wear contact  lenses until the inflammation is gone. Wear glasses instead.  Do not wear eye makeup until the inflammation is gone.  Keep all follow-up visits as told by your health care provider. This is important. Contact a health care provider if:  Your symptoms get worse or do not improve with treatment.  You have mild eye pain.  You have sensitivity to light.  You have spots or blisters on your eyes.  You have pus draining from your eye.  You have a fever. Get help right away if:  You have redness, swelling, or other symptoms in only one eye.  Your vision is blurred or you have vision changes.  You have severe eye pain. This information is not intended to replace advice given to you by your health care provider. Make sure you discuss any questions you have with your health care provider. Document Revised: 01/13/2017 Document Reviewed: 08/14/2015 Elsevier Patient Education  2020 [employer]. Allergic Rhinitis, Adult Allergic rhinitis is an allergic reaction that affects the mucous membrane inside the nose. It causes sneezing, a runny or stuffy nose, and the feeling of mucus going down the back of the throat (postnasal drip). Allergic rhinitis can be mild to severe. There are two types of allergic rhinitis:  Seasonal. This type is also called hay fever. It happens only during certain seasons.  Perennial. This type can happen at any time of the year. What are the causes? This condition happens when the body's defense system (immune system) responds to certain harmless substances called allergens as though they were germs.  Seasonal allergic rhinitis is triggered by pollen, which can come from grasses, trees, and weeds. Perennial allergic rhinitis may be caused by:  House dust mites.  Pet dander.  Mold spores. What are the signs or symptoms? Symptoms of this condition include:  Sneezing.  Runny or stuffy nose (nasal congestion).  Postnasal drip.  Itchy nose.  Tearing of the  eyes.  Trouble sleeping.  Daytime sleepiness. How is this diagnosed? This condition may be diagnosed based on:  Your medical history.  A physical exam.  Tests to check for related conditions, such as: ? Asthma. ? Pink eye. ? Ear infection. ? Upper respiratory infection.  Tests to find out which allergens trigger your symptoms. These may include skin or blood tests. How is this treated? There is no cure for this condition, but treatment can help control symptoms. Treatment may include:  Taking medicines that block allergy symptoms, such as antihistamines. Medicine may be given as a shot, nasal spray, or pill.  Avoiding the allergen.  Desensitization. This treatment involves getting ongoing shots until your body becomes less sensitive to the allergen. This treatment may be done if other treatments do not help.  If taking medicine and avoiding the allergen does not work, new, stronger medicines may be prescribed. Follow these instructions at home:  Find out what you are allergic to. Common allergens include smoke, dust, and pollen.  Avoid the things you are allergic to. These are some things you can do to help avoid allergens: ? Replace carpet with wood, tile, or vinyl flooring. Carpet can trap dander and  dust. ? Do not smoke. Do not allow smoking in your home. ? Change your heating and air conditioning filter at least once a month. ? During allergy season:  Keep windows closed as much as possible.  Plan outdoor activities when pollen counts are lowest. This is usually during the evening hours.  When coming indoors, change clothing and shower before sitting on furniture or bedding.  Take over-the-counter and prescription medicines only as told by your health care provider.  Keep all follow-up visits as told by your health care provider. This is important. Contact a health care provider if:  You have a fever.  You develop a persistent cough.  You make whistling  sounds when you breathe (you wheeze).  Your symptoms interfere with your normal daily activities. Get help right away if:  You have shortness of breath. Summary  This condition can be managed by taking medicines as directed and avoiding allergens.  Contact your health care provider if you develop a persistent cough or fever.  During allergy season, keep windows closed as much as possible. This information is not intended to replace advice given to you by your health care provider. Make sure you discuss any questions you have with your health care provider. Document Revised: 01/13/2017 Document Reviewed: 03/10/2016 Elsevier Patient Education  2020 [employer].

## 2019-03-19 ENCOUNTER — Encounter: Payer: Self-pay | Admitting: Adult Health Nurse Practitioner

## 2019-03-19 DIAGNOSIS — Z23 Encounter for immunization: Secondary | ICD-10-CM | POA: Insufficient documentation

## 2019-03-19 DIAGNOSIS — H1013 Acute atopic conjunctivitis, bilateral: Secondary | ICD-10-CM | POA: Insufficient documentation

## 2019-03-19 NOTE — Progress Notes (Signed)
History   Chief Complaint  Patient presents with  . Rash    Pt stated that she has a rash under her left eye for the past [redacted] weeks along with some itching and  watery eyes    HPI  Patient presents for what is becoming chronic itchy eyes and rash under her left eye.  Reports it is constant and that work is problematic as when she has symptoms, there is a question of Covid.  She denies cough, SOB, or loss of taste or smell.  Has started having swelling under her eyes with rash.  Gets very bad if she is vaccuuming at home and stirs up dust.  Notes previous allergy testing in the past allergic to dust mimtes.   Past Medical History:  Diagnosis Date  . Medical history non-contributory    Past Surgical History:  Procedure Laterality Date  . LAPAROSCOPY N/A 01/16/2017   Procedure: LAPAROSCOPY OPERATIVE, RIGHT SALPINGECTOMY;  Surgeon: Tereso Newcomer, MD;  Location: [facility];  Service: Gynecology;  Laterality: N/A;   No family history on file. Social History   Tobacco Use  . Smoking status: Never Smoker  . Smokeless tobacco: Never Used  Substance Use Topics  . Alcohol use: No  . Drug use: No   OB History    Gravida  4   Para      Term      Preterm      AB  3   Living        SAB  2   TAB      Ectopic  1   Multiple      Live Births             Review of Systems   Review of Systems  Ophthalmic ROS: positive for - excessive tearing, itchy eyes and allergic shiners negative for - blurry vision, decreased vision, loss of vision or photophobia  Allergy and Immunology ROS: positive for - itchy/watery eyes, nasal congestion, postnasal drip and seasonal allergies negative for - hives or insect bite sensitivity ENT ROS: positive for - sneezing and ear itching negative for - sinus pain or sore throat Respiratory ROS: no cough, shortness of breath, or wheezing   Allergies   Patient has no known allergies.  Home Medications    Current Outpatient Medications:   .  acetaminophen (TYLENOL) 500 MG tablet, Take 1,000 mg by mouth every 6 (six) hours as needed for mild pain or moderate pain., Disp: , Rfl:  .  acetic acid-hydrocortisone (VOSOL-HC) OTIC solution, Place 4 drops into both ears 2 (two) times daily., Disp: 10 mL, Rfl: 0 .  azelastine (ASTELIN) 0.1 % nasal spray, Place 1 spray into both nostrils 2 (two) times daily. Use in each nostril as directed, Disp: 30 mL, Rfl: 1 .  cetirizine (ZYRTEC) 10 MG tablet, Take 1 tablet (10 mg total) by mouth daily., Disp: 30 tablet, Rfl: 11 .  docusate sodium (COLACE) 50 MG capsule, Take 1 capsule (50 mg total) by mouth 2 (two) times daily. (Patient not taking: Reported on 03/12/2019), Disp: 10 capsule, Rfl: 0 .  ketorolac (TORADOL) 10 MG tablet, Take 1 tablet (10 mg total) by mouth every 6 (six) hours as needed. (Patient not taking: Reported on 03/12/2019), Disp: 20 tablet, Rfl: 0 .  olopatadine (PATANOL) 0.1 % ophthalmic solution, Place 1 drop into both eyes 2 (two) times daily for 14 days., Disp: 5 mL, Rfl: 2 .  ondansetron (ZOFRAN ODT) 8 MG disintegrating tablet, Take  1 tablet (8 mg total) by mouth every 8 (eight) hours as needed for nausea or vomiting. (Patient not taking: Reported on 03/12/2019), Disp: 20 tablet, Rfl: 0 .  oxyCODONE-acetaminophen (PERCOCET/ROXICET) 5-325 MG tablet, Take 1-2 tablets by mouth every 4 (four) hours as needed for moderate pain or severe pain. (Patient not taking: Reported on 03/12/2019), Disp: 15 tablet, Rfl: 0 .  triamcinolone cream (KENALOG) 0.1 %, Apply 1 application topically 2 (two) times daily., Disp: 30 g, Rfl: 0  Current Facility-Administered Medications:  .  polyethylene glycol powder (GLYCOLAX/MIRALAX) container 255 g, 1 Container, Oral, Once, Woodroe Mode, MD  Meds Ordered and Administered this Visit   Meds ordered this encounter  Medications  . cetirizine (ZYRTEC) 10 MG tablet    Sig: Take 1 tablet (10 mg total) by mouth daily.    Dispense:  30 tablet    Refill:  11   . olopatadine (PATANOL) 0.1 % ophthalmic solution    Sig: Place 1 drop into both eyes 2 (two) times daily for 14 days.    Dispense:  5 mL    Refill:  2  . azelastine (ASTELIN) 0.1 % nasal spray    Sig: Place 1 spray into both nostrils 2 (two) times daily. Use in each nostril as directed    Dispense:  30 mL    Refill:  1  . triamcinolone cream (KENALOG) 0.1 %    Sig: Apply 1 application topically 2 (two) times daily.    Dispense:  30 g    Refill:  0  . acetic acid-hydrocortisone (VOSOL-HC) OTIC solution    Sig: Place 4 drops into both ears 2 (two) times daily.    Dispense:  10 mL    Refill:  0    BP 100/70 (BP Location: Left Arm, Patient Position: Sitting, Cuff Size: Normal)   Pulse 87   Temp 98 F (36.7 C) (Temporal)   Ht 5\' 5"  (1.651 m)   Wt 215 lb 9.6 oz (97.8 kg)   LMP 03/09/2019   SpO2 100%   BMI 35.88 kg/m   Physical Exam General appearance: alert, well appearing, and in no distress and oriented to person, place, and time. Patient appears well. Afebrile. Eyes; very moderate allergic conjunctivitis noted with moderate infraorbital swelling, L>R. Nose; nasal pallor and congestion noted. Ears are normal. Throat unremarkable. Neck without significant lymphadenopathy. Lungs are clear to auscultation.   MDM   1. Need for prophylactic vaccination and inoculation against influenza   2. Allergic conjunctivitis and rhinitis, bilateral    Orders Placed This Encounter  Procedures  . Flu Vaccine QUAD 36+ mos IM   Meds ordered this encounter  Medications  . cetirizine (ZYRTEC) 10 MG tablet    Sig: Take 1 tablet (10 mg total) by mouth daily.    Dispense:  30 tablet    Refill:  11  . olopatadine (PATANOL) 0.1 % ophthalmic solution    Sig: Place 1 drop into both eyes 2 (two) times daily for 14 days.    Dispense:  5 mL    Refill:  2  . azelastine (ASTELIN) 0.1 % nasal spray    Sig: Place 1 spray into both nostrils 2 (two) times daily. Use in each nostril as directed     Dispense:  30 mL    Refill:  1  . triamcinolone cream (KENALOG) 0.1 %    Sig: Apply 1 application topically 2 (two) times daily.    Dispense:  30 g    Refill:  0  . acetic acid-hydrocortisone (VOSOL-HC) OTIC solution    Sig: Place 4 drops into both ears 2 (two) times daily.    Dispense:  10 mL    Refill:  0   Will tx for allergic rhinitis/conjunctivitis, and eczema in ear.  Note written for change of rugs in apt. Complex due to exacerbation of daily allergies.  She is satisfied with plan.  If further worsening, will refer for allergy testing.  She   Elyse Jarvis, NP

## 2019-12-16 HISTORY — PX: OTHER SURGICAL HISTORY: SHX169

## 2020-01-21 ENCOUNTER — Ambulatory Visit: Payer: 59 | Admitting: Family Medicine

## 2020-01-22 ENCOUNTER — Other Ambulatory Visit: Payer: Self-pay

## 2020-01-22 ENCOUNTER — Encounter: Payer: Self-pay | Admitting: Family Medicine

## 2020-01-22 ENCOUNTER — Encounter (HOSPITAL_COMMUNITY): Payer: Self-pay | Admitting: Obstetrics and Gynecology

## 2020-01-22 ENCOUNTER — Inpatient Hospital Stay (HOSPITAL_COMMUNITY)
Admission: AD | Admit: 2020-01-22 | Discharge: 2020-01-22 | Disposition: A | Discharge status: Home / Self Care | Payer: Medicaid Other | Attending: Obstetrics and Gynecology | Admitting: Obstetrics and Gynecology

## 2020-01-22 ENCOUNTER — Inpatient Hospital Stay (HOSPITAL_COMMUNITY): Payer: Medicaid Other

## 2020-01-22 DIAGNOSIS — O30041 Twin pregnancy, dichorionic/diamniotic, first trimester: Secondary | ICD-10-CM | POA: Insufficient documentation

## 2020-01-22 DIAGNOSIS — B373 Candidiasis of vulva and vagina: Secondary | ICD-10-CM | POA: Diagnosis not present

## 2020-01-22 DIAGNOSIS — Z3A08 8 weeks gestation of pregnancy: Secondary | ICD-10-CM | POA: Diagnosis not present

## 2020-01-22 DIAGNOSIS — R109 Unspecified abdominal pain: Secondary | ICD-10-CM | POA: Diagnosis not present

## 2020-01-22 DIAGNOSIS — O98811 Other maternal infectious and parasitic diseases complicating pregnancy, first trimester: Secondary | ICD-10-CM | POA: Diagnosis not present

## 2020-01-22 DIAGNOSIS — Z3A01 Less than 8 weeks gestation of pregnancy: Secondary | ICD-10-CM

## 2020-01-22 DIAGNOSIS — R110 Nausea: Secondary | ICD-10-CM | POA: Diagnosis not present

## 2020-01-22 DIAGNOSIS — O23591 Infection of other part of genital tract in pregnancy, first trimester: Secondary | ICD-10-CM | POA: Diagnosis not present

## 2020-01-22 DIAGNOSIS — O30042 Twin pregnancy, dichorionic/diamniotic, second trimester: Secondary | ICD-10-CM

## 2020-01-22 DIAGNOSIS — O26891 Other specified pregnancy related conditions, first trimester: Secondary | ICD-10-CM | POA: Diagnosis present

## 2020-01-22 DIAGNOSIS — O26899 Other specified pregnancy related conditions, unspecified trimester: Secondary | ICD-10-CM

## 2020-01-22 DIAGNOSIS — O30049 Twin pregnancy, dichorionic/diamniotic, unspecified trimester: Secondary | ICD-10-CM | POA: Diagnosis present

## 2020-01-22 LAB — WET PREP, GENITAL
Clue Cells Wet Prep HPF POC: NONE SEEN
Sperm: NONE SEEN
Trich, Wet Prep: NONE SEEN

## 2020-01-22 LAB — URINALYSIS, ROUTINE W REFLEX MICROSCOPIC
Bilirubin Urine: NEGATIVE
Glucose, UA: NEGATIVE mg/dL
Hgb urine dipstick: NEGATIVE
Ketones, ur: NEGATIVE mg/dL
Nitrite: NEGATIVE
Protein, ur: NEGATIVE mg/dL
Specific Gravity, Urine: 1.027 (ref 1.005–1.030)
pH: 5 (ref 5.0–8.0)

## 2020-01-22 LAB — CBC
HCT: 36.4 % (ref 36.0–46.0)
Hemoglobin: 12.6 g/dL (ref 12.0–15.0)
MCH: 30.1 pg (ref 26.0–34.0)
MCHC: 34.6 g/dL (ref 30.0–36.0)
MCV: 87.1 fL (ref 80.0–100.0)
Platelets: 238 10*3/uL (ref 150–400)
RBC: 4.18 MIL/uL (ref 3.87–5.11)
RDW: 13.2 % (ref 11.5–15.5)
WBC: 6.9 10*3/uL (ref 4.0–10.5)
nRBC: 0 % (ref 0.0–0.2)

## 2020-01-22 LAB — HCG, QUANTITATIVE, PREGNANCY: hCG, Beta Chain, Quant, S: 98000 m[IU]/mL — ABNORMAL HIGH (ref <5)

## 2020-01-22 LAB — POCT PREGNANCY, URINE: Preg Test, Ur: POSITIVE — AB

## 2020-01-22 MED ORDER — TERCONAZOLE 0.4 % VA CREA: 1.0000 | TOPICAL_CREAM | Freq: Every day | VAGINAL | 0 refills | Status: DC

## 2020-01-22 MED ORDER — METOCLOPRAMIDE HCL 10 MG PO TABS
10.0000 mg | ORAL_TABLET | Freq: Once | ORAL | Status: AC
  Administered 2020-01-22: 10 mg via ORAL
  Filled 2020-01-22: qty 1

## 2020-01-22 NOTE — Discharge Instructions (Signed)
Abdominal Pain During Pregnancy  Abdominal pain is common during pregnancy, and has many possible causes. Some causes are more serious than others, and sometimes the cause is not known. Abdominal pain can be a sign that labor is starting. It can also be caused by normal growth and stretching of muscles and ligaments during pregnancy. Always tell your health care provider if you have any abdominal pain. Follow these instructions at home:  Do not have sex or put anything in your vagina until your pain goes away completely.  Get plenty of rest until your pain improves.  Drink enough fluid to keep your urine pale yellow.  Take over-the-counter and prescription medicines only as told by your health care provider.  Keep all follow-up visits as told by your health care provider. This is important. Contact a health care provider if:  Your pain continues or gets worse after resting.  You have lower abdominal pain that: ? Comes and goes at regular intervals. ? Spreads to your back. ? Is similar to menstrual cramps.  You have pain or burning when you urinate. Get help right away if:  You have a fever or chills.  You have vaginal bleeding.  You are leaking fluid from your vagina.  You are passing tissue from your vagina.  You have vomiting or diarrhea that lasts for more than 24 hours.  Your baby is moving less than usual.  You feel very weak or faint.  You have shortness of breath.  You develop severe pain in your upper abdomen. Summary  Abdominal pain is common during pregnancy, and has many possible causes.  If you experience abdominal pain during pregnancy, tell your health care provider right away.  Follow your health care provider's home care instructions and keep all follow-up visits as directed. This information is not intended to replace advice given to you by your health care provider. Make sure you discuss any questions you have with your health care  provider. Document Revised: 05/21/2018 Document Reviewed: 05/05/2016 Elsevier Patient Education  2020 Elsevier Inc.  [facility] Prenatal Care Providers   Center for [facility] at [facility]       Phone: [phone]  Center for [facility] at [place] Phone: [phone]  Center for [employer] at [place]  Phone: [phone]  Center for [facility] Healthcare at [facility]  Phone: [phone]  Center for [facility] Healthcare at [place]  Phone: [phone]  [facility]       Phone: [phone]  [facility]    Phone: [phone]   [facility])    Phone: [phone]  [facility]    Phone: [phone]  [facility]    Phone: [phone]   [facility]  Phone: [phone]  [facility]    Phone: [phone]  [facility]   Phone: [phone]  [facility]    Phone: [phone]

## 2020-01-22 NOTE — MAU Provider Note (Signed)
History     CSN: [id number]  Arrival date and time: 01/22/20 1620   First Provider Initiated Contact with Patient 01/22/20 1756      Chief Complaint  Patient presents with  . Abdominal Pain  . Nausea   Christy Jackson is a 34 y.o. G5P0040 at [redacted]w[redacted]d by Definite LMP of 11/25/2019.  She presents today for Abdominal Pain and Nausea.  She reports her abdominal pain started about one week ago and is located in the lower abdominal area on the right side.  Patient describes the pain as cramping that is mild to moderate.  Patient reports she has not taken anything for the pain and it is improved with increased movement and worsened with rest. Patient rates the pain a 4/10.  Patient also reports that she was on several medications prior to pregnancy, but has run out of the prescriptions.    OB History    Gravida  5   Para      Term      Preterm      AB  3   Living        SAB  2   TAB      Ectopic  1   Multiple      Live Births              Past Medical History:  Diagnosis Date  . Medical history non-contributory     Past Surgical History:  Procedure Laterality Date  . IVF   12/16/2019  . LAPAROSCOPY N/A 01/16/2017   Procedure: LAPAROSCOPY OPERATIVE, RIGHT SALPINGECTOMY;  Surgeon: Tereso Newcomer, MD;  Location: [facility];  Service: Gynecology;  Laterality: N/A;    History reviewed. No pertinent family history.  Social History   Tobacco Use  . Smoking status: Never Smoker  . Smokeless tobacco: Never Used  Substance Use Topics  . Alcohol use: No  . Drug use: No    Allergies: No Known Allergies  Facility-Administered Medications Prior to Admission  Medication Dose Route Frequency Provider Last Rate Last Admin  . polyethylene glycol powder (GLYCOLAX/MIRALAX) container 255 g  1 Container Oral Once Adam Phenix, MD       Medications Prior to Admission  Medication Sig Dispense Refill Last Dose  . enoxaparin (LOVENOX) 60 MG/0.6ML injection Inject 60 mg into  the skin.   Past Week at Unknown time  . progesterone (ENDOMETRIN) 100 MG vaginal insert Place 100 mg vaginally 2 (two) times daily.   Past Week at Unknown time  . progesterone 200 MG SUPP Place 200 mg vaginally at bedtime.   Past Week at Unknown time  . azelastine (ASTELIN) 0.1 % nasal spray Place 1 spray into both nostrils 2 (two) times daily. Use in each nostril as directed 30 mL 1   . cetirizine (ZYRTEC) 10 MG tablet Take 1 tablet (10 mg total) by mouth daily. 30 tablet 11   . triamcinolone cream (KENALOG) 0.1 % Apply 1 application topically 2 (two) times daily. 30 g 0     Review of Systems  Constitutional: Negative for chills and fever.  Eyes: Negative for visual disturbance.  Respiratory: Negative for cough and shortness of breath.   Gastrointestinal: Positive for nausea. Negative for constipation, diarrhea and vomiting.  Genitourinary: Negative for difficulty urinating, dysuria, vaginal bleeding and vaginal discharge.  Neurological: Positive for dizziness. Negative for light-headedness and headaches.   Physical Exam   Blood pressure 117/76, pulse 90, temperature 98.5 F (36.9 C), resp. rate 17, last menstrual  period 11/25/2019, unknown if currently breastfeeding.  Physical Exam Vitals and nursing note reviewed.  Constitutional:      Appearance: She is well-developed.  HENT:     Head:     Comments: UTA as Hijab in place Eyes:     Conjunctiva/sclera: Conjunctivae normal.  Cardiovascular:     Rate and Rhythm: Normal rate.  Pulmonary:     Effort: Pulmonary effort is normal. No respiratory distress.  Abdominal:     Tenderness: There is abdominal tenderness in the suprapubic area and left lower quadrant.  Genitourinary:    Comments: Swabs collected by nurse.  Musculoskeletal:     Cervical back: Normal range of motion.  Skin:    General: Skin is warm and dry.  Neurological:     Mental Status: She is alert and oriented to person, place, and time.  Psychiatric:        Mood  and Affect: Mood normal.        Behavior: Behavior normal.        Thought Content: Thought content normal.     MAU Course  Procedures Results for orders placed or performed during the hospital encounter of 01/22/20 (from the past 24 hour(s))  Pregnancy, urine POC     Status: Abnormal   Collection Time: 01/22/20  4:47 PM  Result Value Ref Range   Preg Test, Ur POSITIVE (A) NEGATIVE  Urinalysis, Routine w reflex microscopic Urine, Clean Catch     Status: Abnormal   Collection Time: 01/22/20  4:49 PM  Result Value Ref Range   Color, Urine YELLOW YELLOW   APPearance HAZY (A) CLEAR   Specific Gravity, Urine 1.027 1.005 - 1.030   pH 5.0 5.0 - 8.0   Glucose, UA NEGATIVE NEGATIVE mg/dL   Hgb urine dipstick NEGATIVE NEGATIVE   Bilirubin Urine NEGATIVE NEGATIVE   Ketones, ur NEGATIVE NEGATIVE mg/dL   Protein, ur NEGATIVE NEGATIVE mg/dL   Nitrite NEGATIVE NEGATIVE   Leukocytes,Ua MODERATE (A) NEGATIVE   RBC / HPF 0-5 0 - 5 RBC/hpf   WBC, UA 0-5 0 - 5 WBC/hpf   Bacteria, UA RARE (A) NONE SEEN   Squamous Epithelial / LPF 6-10 0 - 5   Mucus PRESENT    Ca Oxalate Crys, UA PRESENT   Wet prep, genital     Status: Abnormal   Collection Time: 01/22/20  5:38 PM   Specimen: Vaginal  Result Value Ref Range   Yeast Wet Prep HPF POC PRESENT (A) NONE SEEN   Trich, Wet Prep NONE SEEN NONE SEEN   Clue Cells Wet Prep HPF POC NONE SEEN NONE SEEN   WBC, Wet Prep HPF POC MANY (A) NONE SEEN   Sperm NONE SEEN   CBC     Status: None   Collection Time: 01/22/20  5:48 PM  Result Value Ref Range   WBC 6.9 4.0 - 10.5 K/uL   RBC 4.18 3.87 - 5.11 MIL/uL   Hemoglobin 12.6 12.0 - 15.0 g/dL   HCT 02.1 36 - 46 %   MCV 87.1 80.0 - 100.0 fL   MCH 30.1 26.0 - 34.0 pg   MCHC 34.6 30.0 - 36.0 g/dL   RDW 11.5 52.0 - 80.2 %   Platelets 238 150 - 400 K/uL   nRBC 0.0 0.0 - 0.2 %  hCG, quantitative, pregnancy     Status: Abnormal   Collection Time: 01/22/20  5:48 PM  Result Value Ref Range   hCG, Beta Chain,  Quant, S 98,000 (H) <5  mIU/mL   US OB Comp AddL Gest Less 14 Wks  Result Date: 01/22/2020 CLINICAL DATA:  Initial evaluation for acute pelvic cramping, history of prior IVF on 12/16/2019. EXAM: TWIN OBSTETRICAL ULTRASOUND <14 WKS COMPARISON:  None available. FINDINGS: Number of IUPs:  2 Chorionicity/Amnionicity:  Dichorionic/diamniotic TWIN 1 Yolk sac:  Present Embryo:  Present Cardiac Activity: Present Heart Rate: 144 bpm CRL: 15.2 mm   7 w 6 d                  [place] EDC: 09/03/2020 TWIN 2 Yolk sac:  Present Embryo:  Present Cardiac Activity: Present Heart Rate: 144 bpm CRL: 12.8 mm 7 w 3 d                  [place] EDC: 09/06/2020 Subchorionic hemorrhage:  None visualized. Maternal uterus/adnexae: Right ovary not visualized. Left ovary normal in appearance. No adnexal mass or free fluid. IMPRESSION: 1. Viable twin pregnancy as detailed above.  No complication. 2. No other acute maternal uterine or adnexal abnormality. Electronically Signed   By: Rise Mu M.D.   On: 01/22/2020 19:15   US OB LESS THAN 14 WEEKS WITH OB TRANSVAGINAL  Result Date: 01/22/2020 CLINICAL DATA:  Initial evaluation for acute pelvic cramping, history of prior IVF on 12/16/2019. EXAM: TWIN OBSTETRICAL ULTRASOUND <14 WKS COMPARISON:  None available. FINDINGS: Number of IUPs:  2 Chorionicity/Amnionicity:  Dichorionic/diamniotic TWIN 1 Yolk sac:  Present Embryo:  Present Cardiac Activity: Present Heart Rate: 144 bpm CRL: 15.2 mm   7 w 6 d                  [place] EDC: 09/03/2020 TWIN 2 Yolk sac:  Present Embryo:  Present Cardiac Activity: Present Heart Rate: 144 bpm CRL: 12.8 mm 7 w 3 d                  [place] EDC: 09/06/2020 Subchorionic hemorrhage:  None visualized. Maternal uterus/adnexae: Right ovary not visualized. Left ovary normal in appearance. No adnexal mass or free fluid. IMPRESSION: 1. Viable twin pregnancy as detailed above.  No complication. 2. No other acute maternal uterine or adnexal abnormality. Electronically Signed   By:  Rise Mu M.D.   On: 01/22/2020 19:15     MDM Physical Exam  Labs: UA, UPT, CBC, hCG, Wet Prep and GC/CT Ultrasound Antiemetic Assessment and Plan  34 year old G5P0040 at 8.2 weeks Abdominal Pain Nausea  -Labs ordered and collected. -Cultures collected blindly by nurse.  -Provider to bedside and POC reviewed. -Exam performed.  -Patient informed that provider unable to fill prescriptions that was given prior to pregnancy. -Discussed need to establish [facility].  -Patient offered and declines pain medication. -Agreeable to antiemetic; reviewed usage of reglan. -Will send for [place] and await results   Cherre Robins 01/22/2020, 5:56 PM   Reassessment (7:20 PM) Candidiasis of vagina TIUP at 7.6 and 7.3 weeks Di/Di  -Results return as above. -Patient informed of yeast and treatment sent to pharmacy on file.  -Reviewed ultrasound and informed of twin pregnancy! -Reassured that two heartbeats noted.  -Given provider list for initiation of prenatal care. -Bleeding precautions given.  -Encouraged to call or return to MAU if symptoms worsen or with the onset of new symptoms. -Discharged to home in improved and stable condition.  Cherre Robins MSN, CNM Advanced Practice Provider, Center for [employer]

## 2020-01-22 NOTE — MAU Note (Addendum)
Patient reports cramping in the middle and left side of her abdomen.  Also reports nausea, dizziness & fatigue.  Denies VB.  LMP 10/11.  Also reports a loss of appetite. Has not taken any pregnancy tests.  Had IVF transfer 11/1 in [place].

## 2020-01-23 LAB — GC/CHLAMYDIA PROBE AMP (~~LOC~~) NOT AT ARMC
Chlamydia: NEGATIVE
Comment: NEGATIVE
Comment: NORMAL
Neisseria Gonorrhea: NEGATIVE

## 2020-02-13 ENCOUNTER — Ambulatory Visit (INDEPENDENT_AMBULATORY_CARE_PROVIDER_SITE_OTHER): Payer: Medicaid Other | Admitting: Family Medicine

## 2020-02-13 ENCOUNTER — Encounter: Payer: Self-pay | Admitting: Family Medicine

## 2020-02-13 ENCOUNTER — Other Ambulatory Visit: Payer: Self-pay

## 2020-02-13 ENCOUNTER — Other Ambulatory Visit (HOSPITAL_COMMUNITY)
Admission: RE | Admit: 2020-02-13 | Discharge: 2020-02-13 | Disposition: A | Discharge status: Home / Self Care | Payer: Medicaid Other | Source: Ambulatory Visit | Attending: Family Medicine | Admitting: Family Medicine

## 2020-02-13 VITALS — Wt 191.9 lb

## 2020-02-13 DIAGNOSIS — U071 COVID-19: Secondary | ICD-10-CM

## 2020-02-13 DIAGNOSIS — O98511 Other viral diseases complicating pregnancy, first trimester: Secondary | ICD-10-CM

## 2020-02-13 DIAGNOSIS — O0991 Supervision of high risk pregnancy, unspecified, first trimester: Secondary | ICD-10-CM | POA: Insufficient documentation

## 2020-02-13 DIAGNOSIS — Z8279 Family history of other congenital malformations, deformations and chromosomal abnormalities: Secondary | ICD-10-CM

## 2020-02-13 DIAGNOSIS — O30041 Twin pregnancy, dichorionic/diamniotic, first trimester: Secondary | ICD-10-CM

## 2020-02-13 DIAGNOSIS — O09529 Supervision of elderly multigravida, unspecified trimester: Secondary | ICD-10-CM

## 2020-02-13 DIAGNOSIS — O0990 Supervision of high risk pregnancy, unspecified, unspecified trimester: Secondary | ICD-10-CM

## 2020-02-13 MED ORDER — ASPIRIN EC 81 MG PO TBEC: 81.0000 mg | DELAYED_RELEASE_TABLET | Freq: Every day | ORAL | 2 refills | Status: DC

## 2020-02-13 MED ORDER — DOXYLAMINE-PYRIDOXINE 10-10 MG PO TBEC: 2.0000 | DELAYED_RELEASE_TABLET | Freq: Every day | ORAL | 5 refills | Status: DC

## 2020-02-13 NOTE — Progress Notes (Signed)
Informal bedside US for [facility] per M-mode.  [name] - 161 bpm, [name] - 174 bpm.  Fetal movement observed form both babies.  Separating membrane visualized.

## 2020-02-13 NOTE — Progress Notes (Signed)
Subjective:  Christy Jackson is a [id number] seen today for her first obstetrical visit.  Her obstetrical history is significant for IVF pregnancy after 2 miscarriages and 2 ectopic pregnancies. [place], which she stopped mid first trimester. This is a desired pregnancy. Her brother has Down's Syndrome. She had COVID during first month of pregnancy - no long term problems. Patient does intend to breast feed. Pregnancy history fully reviewed.  Patient reports nausea and vomiting.  Wt 191 lb 14.4 oz (87 kg)   LMP 11/25/2019   BMI 31.93 kg/m   HISTORY: OB History  Gravida Para Term Preterm AB Living  5       4    SAB IAB Ectopic Multiple Live Births  2   2        # Outcome Date GA Lbr Len/2nd Weight Sex Delivery Anes PTL Lv  5 Current           4 Ectopic 01/2018          3 SAB 05/31/17 [id number]      2 Ectopic 01/16/17 [id number]        Birth Comments: Had laparoscopic right salpingectomy     Complications: Ruptured right tubal ectopic pregnancy causing hemoperitoneum  1 SAB 10/2016            Past Medical History:  Diagnosis Date  . Medical history non-contributory     Past Surgical History:  Procedure Laterality Date  . IVF   12/16/2019  . LAPAROSCOPY N/A 01/16/2017   Procedure: LAPAROSCOPY OPERATIVE, RIGHT SALPINGECTOMY;  Surgeon: AnyaOsborne Oman;  [facility];  Service: Gynecology;  Laterality: N/A;    History reviewed. No pertinent family history.   Exam  Wt 191 lb 14.4 oz (87 kg)   LMP 11/25/2019   BMI 31.93 kg/m   Chaperone present during exam  CONSTITUTIONAL: Well-developed, well-nourished female in no acute distress.  HENT:  Normocephalic, atraumatic, External right and left ear normal. Oropharynx is clear and moist EYES: Conjunctivae and EOM are normal. Pupils are equal, round, and reactive to light. No scleral icterus.  NECK: Normal range of motion, supple, no  masses.  Normal thyroid.  CARDIOVASCULAR: Normal heart rate noted, regular rhythm RESPIRATORY: Clear to auscultation bilaterally. Effort and breath sounds normal, no problems with respiration noted. BREASTS: declined ABDOMEN: Soft, normal bowel sounds, no distention noted.  No tenderness, rebound or guarding.  PELVIC: Normal appearing external genitalia; normal appearing vaginal mucosa and cervix. No abnormal discharge noted.  MUSCULOSKELETAL: Normal range of motion. No tenderness.  No cyanosis, clubbing, or edema.  2+ distal pulses. SKIN: Skin is warm and dry. No rash noted. Not diaphoretic. No erythema. No pallor. NEUROLOGIC: Alert and oriented to person, place, and time. Normal reflexes, muscle tone coordination. No cranial nerve deficit noted. PSYCHIATRIC: Normal mood and affect. Normal behavior. Normal judgment and thought content.    Assessment:    Pregnancy: G5P0040 Patient Active Problem List   Diagnosis Date Noted  . Supervision of high risk pregnancy, antepartum 02/13/2020  . Family history of Down syndrome 02/13/2020  . AMA (advanced maternal age) multigravida 35+,32+specified trimester 02/13/2020  . COVID-19 affecting pregnancy in first trimester 02/13/2020  . Twin gestation, dichorionic diamniotic 01/22/2020  . Need for prophylactic vaccination and inoculation against influenza 03/19/2019      Plan:   1. Supervision of high risk pregnancy  in first trimester Discussed practice arrangement. Desires genetic testing PNL - [place] MFM OB DETAIL +14 WK; Future - Genetic Screening - Culture, OB Urine - Hemoglobin A1c - Cytology - PAP( [place]) - CBC/D/Plt+RPR+Rh+ABO+Rub Ab... - Comp Met (CMET)  2. Supervision of high risk pregnancy, antepartum  3. Dichorionic diamniotic twin pregnancy in first trimester Will need serial [place] Start ASA 74m at 12 weeks - Comp Met (CMET)  4. COVID-19 affecting pregnancy in first trimester Had COVID in first month of pregnancy. No  sequela  5. Family history of Down syndrome Panorama today  6. AMA (advanced maternal age) multigravida 314+ unspecified trimester Panorama ASA 880m    Problem list reviewed and updated. 75% of 30 min visit spent on counseling and coordination of care.     JaTruett Mainland2/30/2021

## 2020-02-13 NOTE — Progress Notes (Addendum)
NEW OB packet given  Home Medicaid form completed

## 2020-02-13 NOTE — Patient Instructions (Signed)
Twin Dietary Recommendations:  . Eat every 3-4 hours: at least 3 meals plus 2-3 snacks per day . Iron supplement twice a day and lean protein: fish, poultry, dairy, nuts . Complex carbohydrates: fruits, legumes, and whole grains . Daily mineral supplements: Calcium 3g, Magnesium 1.2g, Zinc 45mg . Healthy fats: mono and poly unsaturated (olive oil, canola oil, nuts, seeds). Limit saturated fats and trans fats . Omega-3 fatty acid rich fish protein 2-4x per week . BMI specific dietary recommendations for calories and protein  

## 2020-02-14 LAB — COMPREHENSIVE METABOLIC PANEL
ALT: 39 IU/L — ABNORMAL HIGH (ref 0–32)
AST: 25 IU/L (ref 0–40)
Albumin/Globulin Ratio: 1.2 (ref 1.2–2.2)
Albumin: 3.9 g/dL (ref 3.8–4.8)
Alkaline Phosphatase: 92 IU/L (ref 44–121)
BUN/Creatinine Ratio: 7 — ABNORMAL LOW (ref 9–23)
BUN: 3 mg/dL — ABNORMAL LOW (ref 6–20)
Bilirubin Total: 0.2 mg/dL (ref 0.0–1.2)
CO2: 17 mmol/L — ABNORMAL LOW (ref 20–29)
Calcium: 9.5 mg/dL (ref 8.7–10.2)
Chloride: 101 mmol/L (ref 96–106)
Creatinine, Ser: 0.43 mg/dL — ABNORMAL LOW (ref 0.57–1.00)
GFR calc Af Amer: 153 mL/min/{1.73_m2} (ref >59)
GFR calc non Af Amer: 133 mL/min/{1.73_m2} (ref >59)
Globulin, Total: 3.2 g/dL (ref 1.5–4.5)
Glucose: 93 mg/dL (ref 65–99)
Potassium: 4.1 mmol/L (ref 3.5–5.2)
Sodium: 134 mmol/L (ref 134–144)
Total Protein: 7.1 g/dL (ref 6.0–8.5)

## 2020-02-14 LAB — CBC/D/PLT+RPR+RH+ABO+RUB AB...
Antibody Screen: NEGATIVE
Basophils Absolute: 0 10*3/uL (ref 0.0–0.2)
Basos: 1 %
EOS (ABSOLUTE): 0 10*3/uL (ref 0.0–0.4)
Eos: 1 %
HCV Ab: <0.1 s/co ratio (ref 0.0–0.9)
HIV Screen 4th Generation wRfx: NONREACTIVE
Hematocrit: 39.8 % (ref 34.0–46.6)
Hemoglobin: 13.7 g/dL (ref 11.1–15.9)
Hepatitis B Surface Ag: NEGATIVE
Immature Grans (Abs): 0 10*3/uL (ref 0.0–0.1)
Immature Granulocytes: 0 %
Lymphocytes Absolute: 3.2 10*3/uL — ABNORMAL HIGH (ref 0.7–3.1)
Lymphs: 46 %
MCH: 29.8 pg (ref 26.6–33.0)
MCHC: 34.4 g/dL (ref 31.5–35.7)
MCV: 87 fL (ref 79–97)
Monocytes Absolute: 0.5 10*3/uL (ref 0.1–0.9)
Monocytes: 8 %
Neutrophils Absolute: 2.9 10*3/uL (ref 1.4–7.0)
Neutrophils: 44 %
Platelets: 372 10*3/uL (ref 150–450)
RBC: 4.59 x10E6/uL (ref 3.77–5.28)
RDW: 13.1 % (ref 11.7–15.4)
RPR Ser Ql: NONREACTIVE
Rh Factor: POSITIVE
Rubella Antibodies, IGG: 11.7 index (ref >0.99)
WBC: 6.7 10*3/uL (ref 3.4–10.8)

## 2020-02-14 LAB — HCV INTERPRETATION

## 2020-02-14 LAB — HEMOGLOBIN A1C
Est. average glucose Bld gHb Est-mCnc: 117 mg/dL
Hgb A1c MFr Bld: 5.7 % — ABNORMAL HIGH (ref 4.8–5.6)

## 2020-02-15 LAB — CULTURE, OB URINE

## 2020-02-15 LAB — URINE CULTURE, OB REFLEX

## 2020-02-17 LAB — CYTOLOGY - PAP
Comment: NEGATIVE
Diagnosis: NEGATIVE
High risk HPV: NEGATIVE

## 2020-02-18 ENCOUNTER — Telehealth: Payer: Self-pay | Admitting: *Deleted

## 2020-02-18 NOTE — Telephone Encounter (Signed)
I called Christy Jackson and notified her of elevated hemoglobin A1c and need for early 2 hour gtt to screen for GDM per Dr. Adrian Blackwater.  I informed her registar will schedule appointment and call her with appointment. She voices understanding. She also reports she had a little bleeding today of 5 minutes but stopped.  Instructed patient if she has continued bleeding this is worrisome for miscarriage and she needs to be evaluated; advised to go to [facility] if bleeding continues. She voices understanding [name],RN

## 2020-02-18 NOTE — Telephone Encounter (Signed)
-----   Message from Levie Heritage, DO sent at 02/18/2020  1:08 PM EST ----- She needs to be set up for early 2hr GTT

## 2020-02-19 ENCOUNTER — Other Ambulatory Visit: Payer: Medicaid Other

## 2020-02-19 ENCOUNTER — Ambulatory Visit (INDEPENDENT_AMBULATORY_CARE_PROVIDER_SITE_OTHER): Payer: Medicaid Other | Admitting: Family Medicine

## 2020-02-19 ENCOUNTER — Other Ambulatory Visit: Payer: Self-pay

## 2020-02-19 VITALS — BP 101/78 | HR 115

## 2020-02-19 DIAGNOSIS — O209 Hemorrhage in early pregnancy, unspecified: Secondary | ICD-10-CM

## 2020-02-19 DIAGNOSIS — O0990 Supervision of high risk pregnancy, unspecified, unspecified trimester: Secondary | ICD-10-CM | POA: Diagnosis not present

## 2020-02-19 DIAGNOSIS — Z8279 Family history of other congenital malformations, deformations and chromosomal abnormalities: Secondary | ICD-10-CM

## 2020-02-19 DIAGNOSIS — O98511 Other viral diseases complicating pregnancy, first trimester: Secondary | ICD-10-CM

## 2020-02-19 DIAGNOSIS — O30041 Twin pregnancy, dichorionic/diamniotic, first trimester: Secondary | ICD-10-CM

## 2020-02-19 DIAGNOSIS — O09529 Supervision of elderly multigravida, unspecified trimester: Secondary | ICD-10-CM

## 2020-02-19 DIAGNOSIS — U071 COVID-19: Secondary | ICD-10-CM

## 2020-02-19 MED ORDER — PRENATAL PLUS 27-1 MG PO TABS: 1.0000 | ORAL_TABLET | Freq: Every day | ORAL | 11 refills | Status: DC

## 2020-02-19 NOTE — Progress Notes (Signed)
Scheduled

## 2020-02-19 NOTE — Progress Notes (Signed)
Subjective:  Christy Jackson is a 35 y.o. G5P0040 at [redacted]w[redacted]d being seen today for ongoing prenatal care.  She is currently monitored for the following issues for this high-risk pregnancy and has Need for prophylactic vaccination and inoculation against influenza; Twin gestation, dichorionic diamniotic; Supervision of high risk pregnancy, antepartum; Family history of Down syndrome; AMA (advanced maternal age) multigravida 35+, unspecified trimester; and COVID-19 affecting pregnancy in first trimester on their problem list.  Patient reports vaginal bleeding.  Contractions: Not present. Vag. Bleeding: Large.  Movement: Absent. Denies leaking of fluid.   Reports was sitting down yesterday and had bright red gush of blood Did not notice other fluid No quickening yet No vaginal discharge Lasted an hour, used two pads, heavier than a period, then slowed down Has had some cramps but no pain  The following portions of the patient's history were reviewed and updated as appropriate: allergies, current medications, past family history, past medical history, past social history, past surgical history and problem list. Problem list updated.  Objective:   Vitals:   02/19/20 1047  BP: 101/78  Pulse: (!) 115    Fetal Status:     Movement: Absent     General:  Alert, oriented and cooperative. Patient is in no acute distress.  Skin: Skin is warm and dry. No rash noted.   Cardiovascular: Normal heart rate noted  Respiratory: Normal respiratory effort, no problems with respiration noted  Abdomen: Soft, gravid, appropriate for gestational age. Pain/Pressure: Present     Pelvic: Vag. Bleeding: Large     SSE: neg pool, cervix closed, very scant amount of blood at cervical os but none seen in the vaginal vault, no vaginal discharge or other abnormal findings in the vault        Extremities: Normal range of motion.  Edema: None  Mental Status: Normal mood and affect. Normal behavior. Normal judgment and  thought content.   Urinalysis:       Pt informed that the ultrasound is considered a limited OB ultrasound and is not intended to be a complete ultrasound exam.  Patient also informed that the ultrasound is not being completed with the intent of assessing for fetal or placental anomalies or any pelvic abnormalities.  Explained that the purpose of today's ultrasound is to assess for  viability.  Patient acknowledges the purpose of the exam and the limitations of the study.    My Interpretation: di/di twins seen, normal cardiac activity seen for both fetuses, subjectively normal fluid around both fetuses   Assessment and Plan:  Pregnancy: G5P0040 at [redacted]w[redacted]d  1. Supervision of high risk pregnancy, antepartum Patient here for early 2hr GTT but reported heavy vaginal bleeding last night BSUS shows viable di/di IUP with normal fetal cardiac activity visualized for both fetuses as well as normal subjective fluid SSE unremarkable with only a spot of blood on cervical os and no clear source of bleeding Reassured patient of findings, will repeat US to assess for possible etiologies of bleeding Swab also sent to evaluate for infection  - prenatal vitamin w/FE, FA (PRENATAL 1 + 1) 27-1 MG TABS tablet; Take 1 tablet by mouth daily at 12 noon.  Dispense: 30 tablet; Refill: 11  2. Dichorionic diamniotic twin pregnancy in first trimester   3. Family history of Down syndrome   4. COVID-19 affecting pregnancy in first trimester   5. AMA (advanced maternal age) multigravida 35+, unspecified trimester   Preterm labor symptoms and general obstetric precautions including but not limited to  vaginal bleeding, contractions, leaking of fluid and fetal movement were reviewed in detail with the patient. Please refer to After Visit Summary for other counseling recommendations.   Follow up after [place] results are available and for previously scheduled OB visit later this month.    Venora Maples, MD

## 2020-02-19 NOTE — Progress Notes (Signed)
Patient reports large gush of blood yesterday afternoon heavier than a period that slowed down over the course of 2 hours before stopping. Reports a weird sensation in her abdomen at that time. Denies bleeding/pain today

## 2020-02-20 ENCOUNTER — Ambulatory Visit
Admission: RE | Admit: 2020-02-20 | Discharge: 2020-02-20 | Disposition: A | Discharge status: Home / Self Care | Payer: Medicaid Other | Source: Ambulatory Visit | Attending: Family Medicine | Admitting: Family Medicine

## 2020-02-20 ENCOUNTER — Encounter: Payer: Self-pay | Admitting: *Deleted

## 2020-02-20 DIAGNOSIS — O30041 Twin pregnancy, dichorionic/diamniotic, first trimester: Secondary | ICD-10-CM

## 2020-02-20 DIAGNOSIS — O0990 Supervision of high risk pregnancy, unspecified, unspecified trimester: Secondary | ICD-10-CM

## 2020-02-20 DIAGNOSIS — O209 Hemorrhage in early pregnancy, unspecified: Secondary | ICD-10-CM | POA: Insufficient documentation

## 2020-02-20 LAB — GLUCOSE TOLERANCE, 2 HOURS W/ 1HR
Glucose, 1 hour: 125 mg/dL (ref 65–179)
Glucose, 2 hour: 108 mg/dL (ref 65–152)
Glucose, Fasting: 82 mg/dL (ref 65–91)

## 2020-02-21 ENCOUNTER — Telehealth: Payer: Self-pay | Admitting: Family Medicine

## 2020-02-21 ENCOUNTER — Encounter: Payer: Self-pay | Admitting: Family Medicine

## 2020-02-21 DIAGNOSIS — O4400 Placenta previa specified as without hemorrhage, unspecified trimester: Secondary | ICD-10-CM | POA: Insufficient documentation

## 2020-02-21 NOTE — Telephone Encounter (Signed)
Called patient, was not able to reach her. LM for patient to call the office for results of [place] and to check My Chart message.   My Chart message sent.

## 2020-02-21 NOTE — Telephone Encounter (Signed)
Attempted to call patient yesterday and today to discuss results of ultrasound completed yesterday.   Came for office visit with vaginal bleeding which had stopped, unremarkable exam but [place] did show viable di/di twin pregnancy with placenta previa.  Left voicemail saying the OBGYN office was trying to reach her to discuss results.  Continue to try to reach her, if she calls back let her know the findings of the ultrasound and that she should have nothing in the vagina until she has a follow up scan to assess the placental location. Let her know that this event does increase the risk for miscarriage as does the previa but there is not anything we can do to mitigate that risk at this time. Fortunately, the placenta is likely to migrate off the os over time, and hopefully later in the pregnancy she will no longer have a previa.

## 2020-03-12 ENCOUNTER — Other Ambulatory Visit: Payer: Self-pay

## 2020-03-12 ENCOUNTER — Ambulatory Visit (INDEPENDENT_AMBULATORY_CARE_PROVIDER_SITE_OTHER): Payer: Medicaid Other | Admitting: Obstetrics & Gynecology

## 2020-03-12 VITALS — BP 115/86 | HR 103 | Wt 196.6 lb

## 2020-03-12 DIAGNOSIS — O30041 Twin pregnancy, dichorionic/diamniotic, first trimester: Secondary | ICD-10-CM

## 2020-03-12 DIAGNOSIS — Z8279 Family history of other congenital malformations, deformations and chromosomal abnormalities: Secondary | ICD-10-CM

## 2020-03-12 DIAGNOSIS — O4402 Placenta previa specified as without hemorrhage, second trimester: Secondary | ICD-10-CM

## 2020-03-12 DIAGNOSIS — Z3183 Encounter for assisted reproductive fertility procedure cycle: Secondary | ICD-10-CM

## 2020-03-12 DIAGNOSIS — Z3A15 15 weeks gestation of pregnancy: Secondary | ICD-10-CM

## 2020-03-12 NOTE — Progress Notes (Signed)
   PRENATAL VISIT NOTE  Subjective:  Christy Jackson is a 35 y.o. G5P0040 at [redacted]w[redacted]d being seen today for ongoing prenatal care.  She is currently monitored for the following issues for this high-risk pregnancy and has Need for prophylactic vaccination and inoculation against influenza; Twin gestation, dichorionic diamniotic; Supervision of high risk pregnancy, antepartum; Family history of Down syndrome; AMA (advanced maternal age) multigravida 35+, unspecified trimester; COVID-19 affecting pregnancy in first trimester; Placenta previa; and In vitro fertilization on their problem list.  Patient reports no complaints.  She is just worried as she really desires to be a mom.  Contractions: Not present. Vag. Bleeding: None.  Movement: Absent. Denies leaking of fluid.   The following portions of the patient's history were reviewed and updated as appropriate: allergies, current medications, past family history, past medical history, past social history, past surgical history and problem list.   Objective:   Vitals:   03/12/20 1551  BP: 115/86  Pulse: (!) 103  Weight: 196 lb 9.6 oz (89.2 kg)    Fetal Status: Fetal Heart Rate (bpm): 148/155   Movement: Absent     General:  Alert, oriented and cooperative. Patient is in no acute distress.  Skin: Skin is warm and dry. No rash noted.   Cardiovascular: Normal heart rate noted  Respiratory: Normal respiratory effort, no problems with respiration noted  Abdomen: Soft, gravid, appropriate for gestational age.  Pain/Pressure: Present     Pelvic: Cervical exam deferred        Extremities: Normal range of motion.  Edema: None  Mental Status: Normal mood and affect. Normal behavior. Normal judgment and thought content.  Bedside ultrasound done for fetal heart rate: 148/145  Assessment and Plan:  Pregnancy: G5P0040 at [redacted]w[redacted]d 1. [redacted] weeks gestation of pregnancy - on PNV and baby ASA - anatomy scan scheduled 04/09/2020  - follow up 4 weeks  2. Dichorionic  diamniotic twin pregnancy in first trimester  3. Family history of Down syndrome - pt did have genetic testing and she received report of low risk girls.  We do not have this report and will reach out to get it for chart.  4. Placenta previa in second trimester - precautions given.  Pt had bleeding previously after doing vigorous house work.  Has modified this now.  Pelvic rest recommended.     5.  In vitro fertilization  Preterm labor symptoms and general obstetric precautions including but not limited to vaginal bleeding, contractions, leaking of fluid and fetal movement were reviewed in detail with the patient. Please refer to After Visit Summary for other counseling recommendations.   Return in about 4 weeks (around 04/09/2020).  Future Appointments  Date Time Provider Department Center  04/09/2020 12:30 PM [facility] NURSE [facility]  04/09/2020 12:45 PM [facility] US5 [facility]  04/09/2020  2:15 PM Federico Flake, MD [facility]    Jerene Bears, MD

## 2020-03-13 DIAGNOSIS — Z3183 Encounter for assisted reproductive fertility procedure cycle: Secondary | ICD-10-CM | POA: Insufficient documentation

## 2020-03-16 ENCOUNTER — Encounter: Payer: Self-pay | Admitting: General Practice

## 2020-03-16 ENCOUNTER — Telehealth: Payer: Self-pay | Admitting: General Practice

## 2020-03-16 NOTE — Telephone Encounter (Signed)
Called patient and informed her of [employer] test results & recommendation of husband being tested. Patient verbalized understanding & states she was informed of this several years ago. Patient states her husband was already tested. Reviewed patient's chart and discussed her husband had a chromosome analysis not carrier screen testing. Discussed what results mean & likelihood of babies being affected. Discussed we have testing kits in our office she can take home to her husband as well as [employer] pricing/financial assistance information. Patient verbalized understanding to all & states she will talk with her husband and let us know.

## 2020-03-18 ENCOUNTER — Encounter: Payer: Self-pay | Admitting: Family Medicine

## 2020-03-18 ENCOUNTER — Other Ambulatory Visit: Payer: Self-pay | Admitting: Family Medicine

## 2020-03-18 DIAGNOSIS — O352XX0 Maternal care for (suspected) hereditary disease in fetus, not applicable or unspecified: Secondary | ICD-10-CM | POA: Insufficient documentation

## 2020-03-20 ENCOUNTER — Telehealth: Payer: Self-pay

## 2020-03-20 NOTE — Telephone Encounter (Addendum)
-----   Message from Levie Heritage, DO sent at 03/18/2020  5:02 PM EST ----- I've referred her to genetics for abnormal [facility]iz[facility].  Called pt and pt informed me that Avelina Laine has already reached out to them and advised her to get FOB tested at her appt on 04/09/20.  Pt asked how to cover the testing.  I gave pt Thressa Sheller number from [place] pamphlet and informed her that she would be able to asist with her with financials.  Pt verbalized understand.    Addison Naegeli, RN

## 2020-04-09 ENCOUNTER — Other Ambulatory Visit: Payer: Self-pay | Admitting: *Deleted

## 2020-04-09 ENCOUNTER — Ambulatory Visit: Payer: Medicaid Other | Attending: Obstetrics and Gynecology

## 2020-04-09 ENCOUNTER — Ambulatory Visit (INDEPENDENT_AMBULATORY_CARE_PROVIDER_SITE_OTHER): Payer: Medicaid Other | Admitting: Family Medicine

## 2020-04-09 ENCOUNTER — Ambulatory Visit: Payer: Medicaid Other | Admitting: *Deleted

## 2020-04-09 ENCOUNTER — Other Ambulatory Visit: Payer: Self-pay | Admitting: General Practice

## 2020-04-09 ENCOUNTER — Other Ambulatory Visit: Payer: Self-pay

## 2020-04-09 ENCOUNTER — Encounter: Payer: Self-pay | Admitting: *Deleted

## 2020-04-09 VITALS — BP 116/86 | HR 85 | Wt 199.2 lb

## 2020-04-09 DIAGNOSIS — O0990 Supervision of high risk pregnancy, unspecified, unspecified trimester: Secondary | ICD-10-CM | POA: Insufficient documentation

## 2020-04-09 DIAGNOSIS — O30042 Twin pregnancy, dichorionic/diamniotic, second trimester: Secondary | ICD-10-CM

## 2020-04-09 DIAGNOSIS — O0991 Supervision of high risk pregnancy, unspecified, first trimester: Secondary | ICD-10-CM | POA: Diagnosis not present

## 2020-04-09 DIAGNOSIS — O09529 Supervision of elderly multigravida, unspecified trimester: Secondary | ICD-10-CM

## 2020-04-09 DIAGNOSIS — Z3183 Encounter for assisted reproductive fertility procedure cycle: Secondary | ICD-10-CM

## 2020-04-10 ENCOUNTER — Encounter: Payer: Self-pay | Admitting: *Deleted

## 2020-04-10 NOTE — Progress Notes (Signed)
   PRENATAL VISIT NOTE  Subjective:  Christy Jackson is a 35 y.o. G5P0040 at [redacted]w[redacted]d being seen today for ongoing prenatal care.  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  Patient reports no complaints.  Contractions: Not present. Vag. Bleeding: None.  Movement: Absent. Denies leaking of fluid.   The following portions of the patient's history were reviewed and updated as appropriate: allergies, current medications, past family history, past medical history, past social history, past surgical history and problem list.   Objective:   Vitals:   04/09/20 1609  BP: 116/86  Pulse: 85  Weight: 199 lb 3.2 oz (90.4 kg)    Fetal Status:     Movement: Absent     General:  Alert, oriented and cooperative. Patient is in no acute distress.  Skin: Skin is warm and dry. No rash noted.   Cardiovascular: Normal heart rate noted  Respiratory: Normal respiratory effort, no problems with respiration noted  Abdomen: Soft, gravid, appropriate for gestational age.  Pain/Pressure: Present     Pelvic: Cervical exam deferred        Extremities: Normal range of motion.  Edema: None  Mental Status: Normal mood and affect. Normal behavior. Normal judgment and thought content.   Assessment and Plan:  Pregnancy: G5P0040 at [redacted]w[redacted]d 1. Supervision of high risk pregnancy, antepartum - Patient asking about mode of delivery-- she wants what is safest for the baby - CHL AMB BABYSCRIPTS SCHEDULE OPTIMIZATION  2. In vitro fertilization   3. AMA (advanced  maternal age) multigravida 35+, unspecified trimester   4. Dichorionic diamniotic twin pregnancy in second trimester [place] today appropriate per patient-- unable to review at the time of visit Patient was in [place] for nearly 3 hours and was late for our appt Has growth [place] scheduled Reviewed timing of delivery at 38 weeks, unknown whether VD or CS.  Reviewed with patient there are many factors to be considered and it is too early to know  Preterm labor symptoms and general obstetric precautions including but not limited to vaginal bleeding, contractions, leaking of fluid and fetal movement were reviewed in detail with the patient. Please refer to After Visit Summary for other counseling recommendations.   Return in about 4 weeks (around 05/07/2020) for Routine prenatal care, MD only.  Future Appointments  Date Time Provider Department Center  05/07/2020  3:55 PM Adam Phenix, MD [facility]  05/11/2020  1:00 PM [facility] GENETIC COUNSELING [facility]  05/11/2020  2:30 PM [facility] NURSE [facility]  05/11/2020  2:45 PM [facility] US5 [facility]    Federico Flake, MD

## 2020-05-07 ENCOUNTER — Ambulatory Visit (INDEPENDENT_AMBULATORY_CARE_PROVIDER_SITE_OTHER): Payer: Medicaid Other | Admitting: Obstetrics & Gynecology

## 2020-05-07 ENCOUNTER — Encounter: Payer: Self-pay | Admitting: Obstetrics & Gynecology

## 2020-05-07 ENCOUNTER — Other Ambulatory Visit: Payer: Self-pay

## 2020-05-07 VITALS — BP 114/69 | HR 91

## 2020-05-07 DIAGNOSIS — O30042 Twin pregnancy, dichorionic/diamniotic, second trimester: Secondary | ICD-10-CM

## 2020-05-07 DIAGNOSIS — O0990 Supervision of high risk pregnancy, unspecified, unspecified trimester: Secondary | ICD-10-CM

## 2020-05-07 NOTE — Progress Notes (Signed)
When asked about babies movement patient said that she feels "gas" and not sure if it is the babies. She feels this everyday. Patient also stated that she fell on her left side on March 16, 22  [place], [place]

## 2020-05-07 NOTE — Patient Instructions (Signed)

## 2020-05-07 NOTE — Progress Notes (Signed)
   PRENATAL VISIT NOTE  Subjective:  Christy Jackson is a 35 y.o. G5P0040 at [redacted]w[redacted]d being seen today for ongoing prenatal care.  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  Patient reports no complaints.  Contractions: Not present. Vag. Bleeding: None.  Movement: Absent. Denies leaking of fluid.   The following portions of the patient's history were reviewed and updated as appropriate: allergies, current medications, past family history, past medical history, past social history, past surgical history and problem list.   Objective:   Vitals:   05/07/20 1621  BP: 114/69  Pulse: 91    Fetal Status:     Movement: Absent     General:  Alert, oriented and cooperative. Patient is in no acute distress.  Skin: Skin is warm and dry. No rash noted.   Cardiovascular: Normal heart rate noted  Respiratory: Normal respiratory effort, no problems with respiration noted  Abdomen: Soft, gravid, appropriate for gestational age.  Pain/Pressure: Absent     Pelvic: Cervical exam deferred        Extremities: Normal range of motion.  Edema: None  Mental Status: Normal mood and affect. Normal behavior. Normal judgment and thought content.   Assessment and Plan:  Pregnancy: G5P0040 at [redacted]w[redacted]d 1. Dichorionic diamniotic twin pregnancy in second trimester FHR 144/126  2. Supervision of high risk pregnancy, antepartum 2 hr GTT next visit  Preterm labor symptoms and general obstetric precautions including but not limited to vaginal bleeding, contractions, leaking of fluid and fetal movement were reviewed in detail with the  patient. Please refer to After Visit Summary for other counseling recommendations.   Return in about 4 weeks (around 06/04/2020) for 2 hr GTT.  Future Appointments  Date Time Provider Department Center  05/11/2020  1:00 PM [facility] GENETIC COUNSELING RM [facility]  05/11/2020  2:30 PM [facility] NURSE [facility]  05/11/2020  2:45 PM [facility] US5 [facility]    Scheryl Darter, MD

## 2020-05-11 ENCOUNTER — Ambulatory Visit: Payer: Self-pay | Admitting: Genetic Counselor

## 2020-05-11 ENCOUNTER — Encounter: Payer: Self-pay | Admitting: *Deleted

## 2020-05-11 ENCOUNTER — Other Ambulatory Visit: Payer: Self-pay

## 2020-05-11 ENCOUNTER — Ambulatory Visit (HOSPITAL_BASED_OUTPATIENT_CLINIC_OR_DEPARTMENT_OTHER): Payer: Medicaid Other

## 2020-05-11 ENCOUNTER — Other Ambulatory Visit: Payer: Self-pay | Admitting: Obstetrics and Gynecology

## 2020-05-11 ENCOUNTER — Ambulatory Visit: Payer: Medicaid Other | Attending: Obstetrics and Gynecology | Admitting: Genetic Counselor

## 2020-05-11 ENCOUNTER — Ambulatory Visit: Payer: Medicaid Other | Admitting: *Deleted

## 2020-05-11 DIAGNOSIS — O09522 Supervision of elderly multigravida, second trimester: Secondary | ICD-10-CM | POA: Insufficient documentation

## 2020-05-11 DIAGNOSIS — Z148 Genetic carrier of other disease: Secondary | ICD-10-CM

## 2020-05-11 DIAGNOSIS — O30042 Twin pregnancy, dichorionic/diamniotic, second trimester: Secondary | ICD-10-CM | POA: Diagnosis present

## 2020-05-11 DIAGNOSIS — O09812 Supervision of pregnancy resulting from assisted reproductive technology, second trimester: Secondary | ICD-10-CM | POA: Diagnosis not present

## 2020-05-11 DIAGNOSIS — O4402 Placenta previa specified as without hemorrhage, second trimester: Secondary | ICD-10-CM | POA: Diagnosis not present

## 2020-05-11 DIAGNOSIS — Z369 Encounter for antenatal screening, unspecified: Secondary | ICD-10-CM | POA: Diagnosis not present

## 2020-05-11 DIAGNOSIS — E71110 Isovaleric acidemia: Secondary | ICD-10-CM | POA: Insufficient documentation

## 2020-05-11 DIAGNOSIS — Z315 Encounter for genetic counseling: Secondary | ICD-10-CM | POA: Diagnosis not present

## 2020-05-11 DIAGNOSIS — Z3A23 23 weeks gestation of pregnancy: Secondary | ICD-10-CM | POA: Insufficient documentation

## 2020-05-11 DIAGNOSIS — O99282 Endocrine, nutritional and metabolic diseases complicating pregnancy, second trimester: Secondary | ICD-10-CM | POA: Diagnosis not present

## 2020-05-11 DIAGNOSIS — Z8279 Family history of other congenital malformations, deformations and chromosomal abnormalities: Secondary | ICD-10-CM

## 2020-05-11 DIAGNOSIS — O0990 Supervision of high risk pregnancy, unspecified, unspecified trimester: Secondary | ICD-10-CM

## 2020-05-11 DIAGNOSIS — O322XX0 Maternal care for transverse and oblique lie, not applicable or unspecified: Secondary | ICD-10-CM

## 2020-05-11 NOTE — Progress Notes (Signed)
05/11/2020  Christy Jackson July 15, 1985 MRN: [id number] DOV: 05/11/2020  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 [name] presented to her appointment alone.   Indication for genetic counseling - Carrier for isovaleric acidemia  Prenatal history  [name] is a [id number], 35 y.o. female. Her current pregnancy has completed [redacted]w[redacted]d(Estimated Date of Delivery: 09/03/20). [name] had four prior pregnancy losses, two of which were spontaneous miscarriages around 8 weeks' gestation, and two of which were ectopic pregnancies.  The current twin pregnancy was achieved via in vitro fertilization (IVF) [place]. MWeldenreported that she did not complete preimplantation genetic testing for chromosomal aneuploidies (PGT-A) prior to transfer. [name] made aware that IVF can slightly increase the risk for pregnancy-related complications and birth defects such as congenital heart defects (CHDs). Additionally, a group of genetic conditions called imprinting disorders are slightly more likely in individuals conceived via IVF (2-5 in 15,000) compared to the general population (1 in 15,000).  [name] exposure to environmental toxins or chemical agents. She denied the use of alcohol, tobacco or street drugs. She reported taking prenatal vitamins and aspirin. She denied significant viral illnesses, fevers, and bleeding during the course of her pregnancy. Her medical and surgical histories were noncontributory.  Family History  A three generation pedigree was drafted and reviewed. The family history is remarkable for the following:  - [name] a 349year old brother with Down syndrome. We reviewed that Down syndrome is most often caused by an entire extra copy of chromosome 21. This occurs due to errors in chromosomal division during the creation of egg  and sperm cells in a process called nondisjunction. If [name]'s brother has full trisomy 21 due to a nondisjunction event, there would not be an impact on [name]'s personal risk to have a pregnancy affected by Down syndrome, as this process occurs sporadically. However, we reviewed that every woman has an age-related risk for having a pregnancy affected by a chromosomal condition such as Down syndrome. Ms. MChildershad negative NIPS performed during this pregnancy, significantly decreasing the chance of the current fetuses being affected by Down syndrome. See Discussion section for more details.  The remaining family histories were reviewed and found to be noncontributory for birth defects, intellectual disability, recurrent pregnancy loss, and known genetic conditions.    The patient's ancestry [place] The father of the pregnancy's ancestry is [place] Ashkenazi Jewish ancestry and consanguinity were denied. Pedigree will be scanned under Media.  Discussion  [name] referred for genetic counseling as she had Horizon-27 carrier screening that identified her as a carrier for isovaleric acidemia.  Isovaleric acidemia:   We discussed that isovaleric acidemia is a metabolic condition that can present with acute attacks of vomiting, listlessness, lethargy, and poor feeding after birth. Babies with isovaleric acidemia may have an odor of sweaty feet. After the first attack, children with isovaleric acidemia may exhibit symptoms of the chronic intermittent form of the condition, such as failure to thrive, developmental delays, intellectual disability, seizures, and spasticity. Affected children can have attacks like those in the newborn period that are triggered by illness or other periods of stress during childhood. Isovaleric acidemia can range in severity, from being asymptomatic or mild to life-threatening. Outcomes are generally better when treatment is started prior to symptom  onset, with many children displaying normal  growth and development. Treatment for isovaleric acidemia is a low protein diet with leucine restriction.   Isovaleric acidemia is caused by pathogenic variants in the IVD gene. This gene provides instructions for creating an enzyme called isovaleryl-CoA dehydrogenase (IVD). This protein breaks down an amino acid called leucine. Pathogenic variants in the IVD gene lead to a deficiency of the IVD enzyme, which in turn allows for the buildup of acids, ammonia, and other toxins in the body. We discussed that [name]'s specific variant in the IVD gene (c.941C>T, p.A314V) is associated with an attenuated form of isovaleric acidemia. Many individuals who are homozygous or compound heterozygous for this variant are asymptomatic with no metabolic decompensation and normal development, even with limited or no treatment. We discussed that this variant is frequently detected in individuals identified on newborn screening due to mild elevations of isovaleric acidemia-related metabolites or residual IVD enzyme activity. [name] was counseled that even if her husband is also a carrier and the couple's child(ren) inherited both parental variants in the IVD gene, it is possible that they may have no clinical symptoms of the condition.   Isovaleric acidemia is inherited in an autosomal recessive fashion. This means that both parents must be carriers for the condition in order to have a 1 in 4 (25%) chance of having an affected child. We reviewed that based on the pan-ethnic carrier frequency for isovaleric acidemia, [name]'s partner has a 1 in 250 chance of being a carrier for the condition. Thus, the couple currently has a 1 in 1000 (0.1%) chance of having a child with isovaleric acidemia. We discussed that carrier testing for isovaleric acidemia is recommended for [name]'s partner. [name] indicated that she is interested in pursuing partner carrier screening;  however, she does not think her husband will be interested. [name] was also counseled that isovaleric acidemia is included on [place]'s newborn screen.  [name] carrier screening was negative for the other 26 conditions screened. Thus, her risk to be a carrier for these additional conditions (listed separately in the laboratory report) has been reduced but not eliminated. This also significantly reduces her risk of having a child affected by one of these conditions.    Aneuploidy screening results:  We also reviewed that [name] had [facility] noninvasive prenatal screening (NIPS) through the laboratory Christy Jackson that was low-risk for fetal aneuploidies. We reviewed that these results showed a less than 1 in 4,000 risk for trisomy 21 and a less than 1 in 10,000 risk for trisomies 83 and 13. Given that this is a twin pregnancy, analysis for sex chromosome aneuploidies, triploidy, and 22q11.2 deletion syndrome was unavailable. We reviewed that while this testing identifies 94-99% of pregnancies with trisomy 74, trisomy 35, and trisomy 70, it is NOT diagnostic. A positive test result requires confirmation by CVS or amniocentesis, and a negative test result does not rule out a fetal chromosome abnormality. She also understands that this testing does not identify all genetic conditions.  Advanced paternal age:  Given that [name] husband is 64 years of age, we also reviewed considerations associated with advanced paternal age. An individual is considered to be of advanced paternal age (APA) if they are over the age of 1 at time of conception. The chance of de novo (new) single gene mutations in an individual increases with paternal age. We discussed an available screening option for conditions associated with APA called [facility]. [facility]ara utilizes noninvasive prenatal screening (NIPS) to screen a pregnancy for  25 autosomal or X-linked dominant single gene disorders. These include  conditions associated with APA, such as Noonan syndrome, skeletal dysplasias, and craniosynostoses. [facility] is NOT diagnostic, and a positive result requires confirmation by CVS or amniocentesis. A negative test result does not rule out all single gene disorders. [name] indicated that she will discuss this option with her husband.    Ultrasound:   A complete ultrasound was performed today prior to our visit. The ultrasound report will be sent under separate cover. There were no visualized fetal anomalies or markers suggestive of aneuploidy. We discussed that fetuses with some of the single gene disorders associated with APA may demonstrate signs of the condition on ultrasound. However, ultrasound cannot identify all fetuses with these conditions. [name] understands this limitation.  Diagnostic testing:  [name] was also counseled regarding diagnostic testing via amniocentesis. We discussed the technical aspects of the procedure and quoted up to a 1 in 500 (0.2%) risk for spontaneous pregnancy loss or other adverse pregnancy outcomes as a result of amniocentesis. Cultured cells from an amniocentesis sample allow for the visualization of a fetal karyotype, which can detect >99% of large chromosomal aberrations. Chromosomal microarray can also be performed to identify smaller deletions or duplications of fetal chromosomal material. Amniocentesis could also be performed to assess whether the baby is affected by isovaleric acidemia. After careful consideration, [name] declined amniocentesis at this time. She understands that amniocentesis is available at any point after 16 weeks of pregnancy and that she may opt to undergo the procedure at a later date should she change her mind.  Plan:  [name] wishes to pursue partner carrier screening for isovaleric acidemia; however, she believes her husband would prefer to wait until newborn screening to pursue testing for the baby. She requested  information on the condition to bring home and review with her husband. I provided her information about isovaleric acidemia from the [facility] and highlighted the information that was most applicable for her specific variant. I provided her with my contact information and encouraged her to contact me if her husband is interested in having testing. We discussed the availability of sample collection via saliva kit to accommodate his work schedule and insurance considerations given that he has Medicaid.   I counseled [name] regarding the above risks and available options. Second year [employer] genetic counseling student Thayer Headings [place] participated in portions of today's session under my supervision. The approximate face-to-face time with the genetic counselor was 50 minutes.  In summary:  Discussed carrier screening results and options for follow-up testing  Carrier for isovaleric acidemia  Patient's specific variant is associated with attenuated form of condition. Thus, an affected child would likely have mild or no clinical symptoms  Recommend partner carrier screening. Patient's husband is likely not interested in testing and will wait for newborn screening. Patient will contact me if he changes his mind  Reviewed low-risk twin NIPS result  Reduction in risk for Down syndrome, trisomy 65, and trisomy 78  Reviewed results of ultrasound  No fetal anomalies or markers seen  Reduction in risk for fetal aneuploidy  Offered additional testing and screening  Will discuss [facility]ara single gene NIPS with husband given advanced paternal age  Declined amniocentesis  Reviewed family history concerns   Buelah Manis, MS, Counselling psychologist

## 2020-05-12 ENCOUNTER — Other Ambulatory Visit: Payer: Self-pay | Admitting: *Deleted

## 2020-05-12 DIAGNOSIS — O30042 Twin pregnancy, dichorionic/diamniotic, second trimester: Secondary | ICD-10-CM

## 2020-06-03 ENCOUNTER — Other Ambulatory Visit: Payer: Self-pay | Admitting: Lactation Services

## 2020-06-03 DIAGNOSIS — O0990 Supervision of high risk pregnancy, unspecified, unspecified trimester: Secondary | ICD-10-CM

## 2020-06-04 ENCOUNTER — Other Ambulatory Visit: Payer: Medicaid Other

## 2020-06-04 ENCOUNTER — Other Ambulatory Visit: Payer: Self-pay

## 2020-06-04 ENCOUNTER — Ambulatory Visit (INDEPENDENT_AMBULATORY_CARE_PROVIDER_SITE_OTHER): Payer: Medicaid Other | Admitting: Family Medicine

## 2020-06-04 VITALS — BP 114/80 | HR 97 | Wt 209.5 lb

## 2020-06-04 DIAGNOSIS — Z3A28 28 weeks gestation of pregnancy: Secondary | ICD-10-CM | POA: Diagnosis not present

## 2020-06-04 DIAGNOSIS — O0990 Supervision of high risk pregnancy, unspecified, unspecified trimester: Secondary | ICD-10-CM

## 2020-06-04 DIAGNOSIS — Z23 Encounter for immunization: Secondary | ICD-10-CM | POA: Diagnosis not present

## 2020-06-04 NOTE — Patient Instructions (Signed)
Safe Medications in Pregnancy    Constipation: Colace Ducolax suppositories Fleet enema Glycerin suppositories Metamucil Milk of magnesia Miralax Senokot Smooth move tea  You have constipation which is hard stools that are difficult to pass. It is important to have regular bowel movements every 1-3 days that are soft and easy to pass. Hard stools increase your risk of hemorrhoids and are very uncomfortable.   To prevent constipation you can increase the amount of fiber in your diet. Examples of foods with fiber are leafy greens, whole grain breads, oatmeal and other grains.  It is also important to drink at least eight 8oz glass of water everyday.   If you have not has a bowel movement in 4-5 days you made need to clean out your bowel.  This will have establish normal movement through your bowel.    Miralax Clean out  Take 8 capfuls of miralax in 64 oz of gatorade. You can use any fluid that appeals to you (gatorade, water, juice)  Continue to drink at least eight 8 oz glasses of water throughout the day  You can repeat with another 8 capfuls of miralax in 64 oz of gatorade if you are not having a large amount of stools  You will need to be at home and close to a bathroom for about 8 hours when you do the above as you may need to go to the bathroom frequently.   After you are cleaned out: - Start Colace100mg twice daily - Start Miralax once daily - Start a daily fiber supplement like metamucil or citrucel - You can safely use enemas in pregnancy  - if you are having diarrhea you can reduce to Colace once a day or miralax every other day or a 1/2 capful daily.    

## 2020-06-04 NOTE — Progress Notes (Signed)
Patient complains of having trouble breathing at night and occasional cramps

## 2020-06-04 NOTE — Progress Notes (Signed)
   PRENATAL VISIT NOTE  Subjective:  Christy Jackson is a 35 y.o. G5P0040 at [redacted]w[redacted]d being seen today for ongoing prenatal care.  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  Patient reports constipation, abdominal pain, some SOB with lying back.  Contractions: Not present. Vag. Bleeding: None.  Movement: Present. Denies leaking of fluid.   The following portions of the patient's history were reviewed and updated as appropriate: allergies, current medications, past family history, past medical history, past social history, past surgical history and problem list.   Objective:   Vitals:   06/04/20 0904  BP: 114/80  Pulse: 97  Weight: 209 lb 8 oz (95 kg)    Fetal Status: Fetal Heart Rate (bpm): 130/147   Movement: Present     General:  Alert, oriented and cooperative. Patient is in no acute distress.  Skin: Skin is warm and dry. No rash noted.   Cardiovascular: Normal heart rate noted  Respiratory: Normal respiratory effort, no problems with respiration noted  Abdomen: Soft, gravid, appropriate for gestational age.  Pain/Pressure: Present     Pelvic: Cervical exam deferred        Extremities: Normal range of motion.  Edema: None  Mental Status: Normal mood and affect. Normal behavior. Normal judgment and thought content.   Assessment and Plan:  Pregnancy: G5P0040 at [redacted]w[redacted]d 1. Supervision of high risk pregnancy, antepartum Reviewed normally of the her current complaints Discussed constipation and gave guidance for stool softners Discussed cadence of visits changing to q2 wks Reviewed that she is safe to drive,  she reports her husband is nervous about her driving and has started driving her everywhere which is decreasing her ability to prepare for the babies and see friends  2. [redacted] weeks gestation of pregnancy - Tdap vaccine greater than or equal to 7yo IM  Preterm labor symptoms and general obstetric precautions including but not limited to vaginal bleeding, contractions, leaking of fluid and fetal movement were reviewed in detail with the patient. Please refer to After Visit Summary for other counseling recommendations.   Return in about 3 weeks (around 06/25/2020) for Routine prenatal care, in person.  Future Appointments  Date Time Provider Department Center  06/08/2020  9:45 AM [facility] NURSE [facility]  06/08/2020 10:00 AM [facility] US1 [facility]  06/24/2020  8:55 AM Reva Bores, MD [facility]    Federico Flake, MD

## 2020-06-05 LAB — CBC
Hematocrit: 33.6 % — ABNORMAL LOW (ref 34.0–46.6)
Hemoglobin: 12.1 g/dL (ref 11.1–15.9)
MCH: 31.2 pg (ref 26.6–33.0)
MCHC: 36 g/dL — ABNORMAL HIGH (ref 31.5–35.7)
MCV: 87 fL (ref 79–97)
Platelets: 248 10*3/uL (ref 150–450)
RBC: 3.88 x10E6/uL (ref 3.77–5.28)
RDW: 12 % (ref 11.7–15.4)
WBC: 8.8 10*3/uL (ref 3.4–10.8)

## 2020-06-05 LAB — GLUCOSE TOLERANCE, 2 HOURS W/ 1HR
Glucose, 1 hour: 178 mg/dL (ref 65–179)
Glucose, 2 hour: 137 mg/dL (ref 65–152)
Glucose, Fasting: 97 mg/dL — ABNORMAL HIGH (ref 65–91)

## 2020-06-05 LAB — RPR: RPR Ser Ql: NONREACTIVE

## 2020-06-05 LAB — HIV ANTIBODY (ROUTINE TESTING W REFLEX): HIV Screen 4th Generation wRfx: NONREACTIVE

## 2020-06-08 ENCOUNTER — Other Ambulatory Visit: Payer: Self-pay

## 2020-06-08 ENCOUNTER — Ambulatory Visit: Payer: Medicaid Other | Attending: Obstetrics and Gynecology

## 2020-06-08 ENCOUNTER — Ambulatory Visit (HOSPITAL_BASED_OUTPATIENT_CLINIC_OR_DEPARTMENT_OTHER): Payer: Medicaid Other | Admitting: *Deleted

## 2020-06-08 ENCOUNTER — Encounter: Payer: Self-pay | Admitting: Family Medicine

## 2020-06-08 ENCOUNTER — Other Ambulatory Visit: Payer: Self-pay | Admitting: Family Medicine

## 2020-06-08 ENCOUNTER — Telehealth: Payer: Self-pay

## 2020-06-08 ENCOUNTER — Other Ambulatory Visit: Payer: Self-pay | Admitting: *Deleted

## 2020-06-08 ENCOUNTER — Encounter: Payer: Self-pay | Admitting: *Deleted

## 2020-06-08 DIAGNOSIS — O09522 Supervision of elderly multigravida, second trimester: Secondary | ICD-10-CM | POA: Diagnosis not present

## 2020-06-08 DIAGNOSIS — Z3A27 27 weeks gestation of pregnancy: Secondary | ICD-10-CM

## 2020-06-08 DIAGNOSIS — O4412 Placenta previa with hemorrhage, second trimester: Secondary | ICD-10-CM

## 2020-06-08 DIAGNOSIS — O30042 Twin pregnancy, dichorionic/diamniotic, second trimester: Secondary | ICD-10-CM | POA: Insufficient documentation

## 2020-06-08 DIAGNOSIS — O24410 Gestational diabetes mellitus in pregnancy, diet controlled: Secondary | ICD-10-CM

## 2020-06-08 DIAGNOSIS — O322XX2 Maternal care for transverse and oblique lie, fetus 2: Secondary | ICD-10-CM

## 2020-06-08 DIAGNOSIS — O0990 Supervision of high risk pregnancy, unspecified, unspecified trimester: Secondary | ICD-10-CM | POA: Diagnosis present

## 2020-06-08 DIAGNOSIS — Z362 Encounter for other antenatal screening follow-up: Secondary | ICD-10-CM | POA: Diagnosis not present

## 2020-06-08 DIAGNOSIS — Z8279 Family history of other congenital malformations, deformations and chromosomal abnormalities: Secondary | ICD-10-CM | POA: Diagnosis not present

## 2020-06-08 DIAGNOSIS — O30043 Twin pregnancy, dichorionic/diamniotic, third trimester: Secondary | ICD-10-CM

## 2020-06-08 DIAGNOSIS — Z148 Genetic carrier of other disease: Secondary | ICD-10-CM

## 2020-06-08 DIAGNOSIS — O322XX1 Maternal care for transverse and oblique lie, fetus 1: Secondary | ICD-10-CM

## 2020-06-08 DIAGNOSIS — O24419 Gestational diabetes mellitus in pregnancy, unspecified control: Secondary | ICD-10-CM | POA: Insufficient documentation

## 2020-06-08 DIAGNOSIS — O09812 Supervision of pregnancy resulting from assisted reproductive technology, second trimester: Secondary | ICD-10-CM

## 2020-06-08 MED ORDER — ACCU-CHEK SOFTCLIX LANCETS MISC: 120.0000 | Freq: Four times a day (QID) | 12 refills | Status: DC

## 2020-06-08 MED ORDER — ACCU-CHEK NANO SMARTVIEW W/DEVICE KIT: 1.0000 | PACK | 0 refills | Status: DC

## 2020-06-08 MED ORDER — ACCU-CHEK SMARTVIEW VI STRP: ORAL_STRIP | 12 refills | Status: DC

## 2020-06-08 NOTE — Telephone Encounter (Signed)
-----   Message from Federico Flake, MD sent at 06/06/2020  9:35 PM EDT ----- Failed 2hr GTT.  Has Gestational diabetes and Needs supplies sent to pharmacy of choice and appt with Marylene Land.

## 2020-06-08 NOTE — Telephone Encounter (Signed)
Left message for pt to return call to office to discuss results.   Judeth Cornfield, RN  06/08/20.

## 2020-06-08 NOTE — Progress Notes (Addendum)
MFM Note  Christy Jackson was seen for a follow up growth scan due to a dichorionic, diamniotic twin gestation.  She reports that she was recently diagnosed with diet controlled gestational diabetes.  She is scheduled for diabetic teaching soon.   Due to the IVF pregnancy she had a normal fetal echocardiogram performed with [facility] pediatric cardiology.  She was informed that the fetal growth and amniotic fluid level appears appropriate for her gestational age for both [name] and [name].  The implications and management of diabetes in pregnancy was discussed in detail with the patient. She was advised that our goals for her fingerstick values are fasting values of 90-95 or less and two-hour postprandials of 120 or less.  Should her fingerstick values be above these values, she may have to be started on insulin or metformin to help her achieve better glycemic control. The patient was advised that getting her fingerstick values as close to these goals as possible would provide her with the most optimal obstetrical outcome.  Due to the dichorionic twin gestation, weekly fetal testing should be started at around 36 weeks.  Weekly fetal testing should be started at 32 weeks should she require insulin or metformin for treatment of gestational diabetes.  A follow up growth scan was scheduled in 4 weeks.  A total of 15 minutes was spent counseling and coordinating the care for this patient.  Greater than 50% of the time was spent in direct face-to-face contact.

## 2020-06-10 NOTE — Telephone Encounter (Signed)
Spoke with pt. Pt given results and recommendations per Dr Alvester Morin. Pt verbalized understanding and agreeable to plan of care for GDM. Pt advised on how often to check her blood sugars and advised to keep log and bring to all appts. Pt verbalized understanding. Pt has appt with Marylene Land, diabetes education on 5/11 after ROB appt with Dr Shawnie Pons.  Pt aware and agreeable to date and time of appts.   Judeth Cornfield, RN  06/10/20.

## 2020-06-24 ENCOUNTER — Other Ambulatory Visit: Payer: Self-pay

## 2020-06-24 ENCOUNTER — Encounter: Payer: Medicaid Other | Attending: Family Medicine | Admitting: Registered"

## 2020-06-24 ENCOUNTER — Ambulatory Visit (INDEPENDENT_AMBULATORY_CARE_PROVIDER_SITE_OTHER): Payer: Self-pay | Admitting: Family Medicine

## 2020-06-24 ENCOUNTER — Ambulatory Visit: Payer: Self-pay | Admitting: Registered"

## 2020-06-24 VITALS — BP 113/78 | HR 99 | Wt 211.4 lb

## 2020-06-24 DIAGNOSIS — Z3A00 Weeks of gestation of pregnancy not specified: Secondary | ICD-10-CM | POA: Diagnosis not present

## 2020-06-24 DIAGNOSIS — O09529 Supervision of elderly multigravida, unspecified trimester: Secondary | ICD-10-CM

## 2020-06-24 DIAGNOSIS — O30042 Twin pregnancy, dichorionic/diamniotic, second trimester: Secondary | ICD-10-CM

## 2020-06-24 DIAGNOSIS — O0990 Supervision of high risk pregnancy, unspecified, unspecified trimester: Secondary | ICD-10-CM

## 2020-06-24 DIAGNOSIS — O24419 Gestational diabetes mellitus in pregnancy, unspecified control: Secondary | ICD-10-CM

## 2020-06-24 DIAGNOSIS — O24410 Gestational diabetes mellitus in pregnancy, diet controlled: Secondary | ICD-10-CM | POA: Diagnosis not present

## 2020-06-24 LAB — POCT URINALYSIS DIP (DEVICE)
Bilirubin Urine: NEGATIVE
Glucose, UA: NEGATIVE mg/dL
Hgb urine dipstick: NEGATIVE
Ketones, ur: NEGATIVE mg/dL
Leukocytes,Ua: NEGATIVE
Nitrite: NEGATIVE
Protein, ur: NEGATIVE mg/dL
Specific Gravity, Urine: 1.025 (ref 1.005–1.030)
Urobilinogen, UA: 0.2 mg/dL (ref 0.0–1.0)
pH: 5.5 (ref 5.0–8.0)

## 2020-06-24 NOTE — Progress Notes (Signed)
    PRENATAL VISIT NOTE  Subjective:  Christy Jackson is a 35 y.o. [id number] at 106w6d being seen today for ongoing prenatal care.  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  Patient reports no complaints.  Contractions: Not present. Vag. Bleeding: None.  Movement: Present. Denies leaking of fluid.   The following portions of the patient's history were reviewed and updated as appropriate: allergies, current medications, past family history, past medical history, past social history, past surgical history and problem list.   Objective:   Vitals:   06/24/20 1016  BP: 113/78  Pulse: 99  Weight: 211 lb 6.4 oz (95.9 kg)    Fetal Status: Fetal Heart Rate (bpm): 133/150   Movement: Present     General:  Alert, oriented and cooperative. Patient is in no acute distress.  Skin: Skin is warm and dry. No rash noted.   Cardiovascular: Normal heart rate noted  Respiratory: Normal respiratory effort, no problems with respiration noted  Abdomen: Soft, gravid, appropriate for gestational age.  Pain/Pressure: Absent     Pelvic: Cervical exam deferred        Extremities: Normal range of motion.  Edema: None  Mental Status: Normal mood and affect. Normal behavior. Normal judgment and thought content.   Assessment and Plan:  Pregnancy: G5P0040 at [redacted]w[redacted]d 1. Diet controlled gestational diabetes mellitus (GDM), antepartum S/p teaching and diet today--will follow-up with [facility] next visit  2. Dichorionic diamniotic twin pregnancy in second trimester Appropriate growth, f/u u/s in 2  weeks PTL precautions Delivery options  3. Supervision of high risk pregnancy, antepartum Recommend childbirth classes  4. AMA (advanced maternal age) multigravida 35+, unspecified trimester Normal NIPT  Preterm labor symptoms and general obstetric precautions including but not limited to vaginal bleeding, contractions, leaking of fluid and fetal movement were reviewed in detail with the patient. Please refer to After Visit Summary for other counseling recommendations.   Return in 2 weeks (on 07/08/2020) for [facility], needs MD.  Future Appointments  Date Time Provider Department Center  07/07/2020  9:45 AM [facility]URSE [facility]  07/07/2020 10:00 AM [facility] US1 [facility]  07/09/2020  3:55 PM Federico Flake, MD [facility]    Reva Bores, MD

## 2020-06-24 NOTE — Progress Notes (Signed)
Patient was seen on 06/24/20 for Gestational Diabetes self-management. EDD 09/03/20; [redacted]w[redacted]d . Patient states no history of GDM. Diet history obtained. Patient eats variety of all food groups. Beverages include water, juice, tea.  Patient is concerned about getting enough of the right nutrition for pregnancy. Pt states she is concerned about lack of omega 3 in her diet because she does not eat fish.  The following learning objectives were met by the patient :   States the definition of Gestational Diabetes  States why dietary management is important in controlling blood glucose  Describes the effects of carbohydrates on blood glucose levels  Demonstrates ability to create a balanced meal plan  Demonstrates carbohydrate counting   States when to check blood glucose levels  Demonstrates proper blood glucose monitoring techniques  States the effect of stress and exercise on blood glucose levels  States the importance of limiting caffeine and abstaining from alcohol and smoking  Omega 3 can be found in walnuts, chia seeds and flaxseeds. Also consider including tuna 1-2 x/week.  Plan:  Aim for 3 Carbohydrate Choices per meal (45 grams) +/- 1 either way  Aim for 1-2 Carbohydrate Choices per snack Begin reading food labels for Total Carbohydrate of foods If OK with your MD, consider  increasing your activity level by walking, Arm Chair Exercises or other activity daily as tolerated Begin checking Blood Glucose before breakfast and 2 hours after first bite of breakfast, lunch and dinner as directed by MD  Bring Log Book/Sheet and meter to every medical appointment  Take medication if directed by MD  Patient already has a meter, is testing pre breakfast and 2 hours after each meal. Did not bring log but states the following SMBG: FBS: 82-93 mg/dL Post prandial: 118-123 mg/dL  Patient instructed to monitor glucose levels: FBS: 60 - 95 mg/dl 2 hour: <120 mg/dl  Patient received the  following handouts:  Nutrition Diabetes and Pregnancy  Carbohydrate Counting List  Blood glucose Log Sheet  Patient will be seen for follow-up as needed.

## 2020-06-24 NOTE — Patient Instructions (Signed)

## 2020-06-24 NOTE — Progress Notes (Signed)
FHRs obtained via informal bedside ultrasound 133 & 150 bpm

## 2020-07-01 ENCOUNTER — Other Ambulatory Visit: Payer: Self-pay

## 2020-07-07 ENCOUNTER — Ambulatory Visit: Payer: Medicaid Other | Admitting: *Deleted

## 2020-07-07 ENCOUNTER — Encounter: Payer: Self-pay | Admitting: *Deleted

## 2020-07-07 ENCOUNTER — Other Ambulatory Visit: Payer: Self-pay

## 2020-07-07 ENCOUNTER — Ambulatory Visit: Payer: Medicaid Other | Attending: Obstetrics and Gynecology

## 2020-07-07 DIAGNOSIS — O30043 Twin pregnancy, dichorionic/diamniotic, third trimester: Secondary | ICD-10-CM | POA: Diagnosis not present

## 2020-07-07 DIAGNOSIS — O4412 Placenta previa with hemorrhage, second trimester: Secondary | ICD-10-CM

## 2020-07-07 DIAGNOSIS — O322XX2 Maternal care for transverse and oblique lie, fetus 2: Secondary | ICD-10-CM

## 2020-07-07 DIAGNOSIS — Z3A31 31 weeks gestation of pregnancy: Secondary | ICD-10-CM

## 2020-07-07 DIAGNOSIS — O09813 Supervision of pregnancy resulting from assisted reproductive technology, third trimester: Secondary | ICD-10-CM

## 2020-07-07 DIAGNOSIS — O321XX1 Maternal care for breech presentation, fetus 1: Secondary | ICD-10-CM

## 2020-07-07 DIAGNOSIS — O0990 Supervision of high risk pregnancy, unspecified, unspecified trimester: Secondary | ICD-10-CM | POA: Diagnosis present

## 2020-07-07 DIAGNOSIS — O09523 Supervision of elderly multigravida, third trimester: Secondary | ICD-10-CM

## 2020-07-07 DIAGNOSIS — Z148 Genetic carrier of other disease: Secondary | ICD-10-CM

## 2020-07-07 DIAGNOSIS — Z8279 Family history of other congenital malformations, deformations and chromosomal abnormalities: Secondary | ICD-10-CM

## 2020-07-07 DIAGNOSIS — O24419 Gestational diabetes mellitus in pregnancy, unspecified control: Secondary | ICD-10-CM

## 2020-07-09 ENCOUNTER — Other Ambulatory Visit: Payer: Self-pay

## 2020-07-09 ENCOUNTER — Ambulatory Visit (INDEPENDENT_AMBULATORY_CARE_PROVIDER_SITE_OTHER): Payer: Medicaid Other | Admitting: Family Medicine

## 2020-07-09 VITALS — BP 127/87 | HR 91 | Wt 217.3 lb

## 2020-07-09 DIAGNOSIS — O09523 Supervision of elderly multigravida, third trimester: Secondary | ICD-10-CM

## 2020-07-09 DIAGNOSIS — O0993 Supervision of high risk pregnancy, unspecified, third trimester: Secondary | ICD-10-CM

## 2020-07-09 DIAGNOSIS — O30043 Twin pregnancy, dichorionic/diamniotic, third trimester: Secondary | ICD-10-CM

## 2020-07-09 DIAGNOSIS — O24410 Gestational diabetes mellitus in pregnancy, diet controlled: Secondary | ICD-10-CM

## 2020-07-09 DIAGNOSIS — O09529 Supervision of elderly multigravida, unspecified trimester: Secondary | ICD-10-CM

## 2020-07-09 DIAGNOSIS — O0990 Supervision of high risk pregnancy, unspecified, unspecified trimester: Secondary | ICD-10-CM

## 2020-07-09 NOTE — Progress Notes (Signed)
   PRENATAL VISIT NOTE  Subjective:  Christy Jackson is a 35 y.o. G5P0040 at [redacted]w[redacted]d being seen today for ongoing prenatal care.  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  Patient reports RUQ pain-- intermittent and not severe. related to positional changes and baby movement. .  Contractions: Irritability. Vag. Bleeding: None.  Movement: Present. Denies leaking of fluid.   The following portions of the patient's history were reviewed and updated as appropriate: allergies, current medications, past family history, past medical history, past social history, past surgical history and problem list.   Objective:   Vitals:   07/09/20 1637  BP: 127/87  Pulse: 91  Weight: 217 lb 4.8 oz (98.6 kg)    Fetal Status: Fetal Heart Rate (bpm): 133/138   Movement: Present     General:  Alert, oriented and cooperative. Patient is in no acute distress.  Skin: Skin is warm and dry. No rash noted.   Cardiovascular: Normal heart rate noted  Respiratory: Normal respiratory effort, no problems with respiration noted  Abdomen: Soft, gravid, appropriate for gestational age.  Pain/Pressure: Present     Pelvic: Cervical exam deferred        Extremities: Normal range of motion.  Edema: None  Mental Status: Normal mood and affect. Normal behavior. Normal judgment and thought content.   Assessment and Plan:  Pregnancy: G5P0040 at [redacted]w[redacted]d  1. Diet controlled gestational diabetes mellitus (GDM), antepartum BG log shows fastings < 95 and pp all under goal except with known dietary indiscretions  which are rare only 2 above goal on review. Given new BG log today  Applauded efforts and continue to check qid.  Reviewed growth at 36 weeks  Echo x2 normal in twins  2. AMA (advanced maternal age) multigravida 35+, unspecified trimester NIP LR  3. Supervision of high risk pregnancy, antepartum Up to date Reviewed OB box Need to discuss peds provider at next visit  4. Dichorionic diamniotic twin pregnancy in third trimester Growth was concordant last visit (3%) [name] is breech currently-- head in upper right quadrant-- I think this the source of her mild intemittent pain.   Preterm labor symptoms and general obstetric precautions including but not limited to vaginal bleeding, contractions, leaking of fluid and fetal movement were reviewed in detail with the patient. Please refer to After Visit Summary for other counseling recommendations.   Return in about 2 weeks (around 07/23/2020) for Routine prenatal care, in person, MD only.  Future Appointments  Date Time Provider Department Center  07/29/2020  3:55 PM Reva Bores, MD [facility]  07/31/2020  8:30 AM [facility] NURSE [facility]  07/31/2020  8:45 AM [facility]5 [facility]    Federico Flake, MD

## 2020-07-14 ENCOUNTER — Other Ambulatory Visit: Payer: Self-pay | Admitting: *Deleted

## 2020-07-14 DIAGNOSIS — O30049 Twin pregnancy, dichorionic/diamniotic, unspecified trimester: Secondary | ICD-10-CM

## 2020-07-29 ENCOUNTER — Ambulatory Visit (INDEPENDENT_AMBULATORY_CARE_PROVIDER_SITE_OTHER): Payer: Medicaid Other | Admitting: Family Medicine

## 2020-07-29 ENCOUNTER — Other Ambulatory Visit: Payer: Self-pay

## 2020-07-29 VITALS — BP 112/77 | HR 96 | Wt 220.0 lb

## 2020-07-29 DIAGNOSIS — O09529 Supervision of elderly multigravida, unspecified trimester: Secondary | ICD-10-CM

## 2020-07-29 DIAGNOSIS — O30043 Twin pregnancy, dichorionic/diamniotic, third trimester: Secondary | ICD-10-CM

## 2020-07-29 DIAGNOSIS — O24410 Gestational diabetes mellitus in pregnancy, diet controlled: Secondary | ICD-10-CM

## 2020-07-29 DIAGNOSIS — K641 Second degree hemorrhoids: Secondary | ICD-10-CM

## 2020-07-29 DIAGNOSIS — O0990 Supervision of high risk pregnancy, unspecified, unspecified trimester: Secondary | ICD-10-CM

## 2020-07-29 MED ORDER — HYDROCORTISONE ACETATE 25 MG RE SUPP: 25.0000 mg | Freq: Two times a day (BID) | RECTAL | 0 refills | Status: DC

## 2020-07-29 NOTE — Progress Notes (Signed)
   PRENATAL VISIT NOTE  Subjective:  Christy Jackson is a 35 y.o. G5P0040 at [redacted]w[redacted]d being seen today for ongoing prenatal care.  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  Patient reports no complaints.  Contractions: Irritability. Vag. Bleeding: None.  Movement: Present. Denies leaking of fluid.   The following portions of the patient's history were reviewed and updated as appropriate: allergies, current medications, past family history, past medical history, past social history, past surgical history and problem list.   Objective:   Vitals:   07/29/20 1627  BP: 112/77  Pulse: 96  Weight: 220 lb (99.8 kg)    Fetal Status: Fetal Heart Rate (bpm): 131/128 Fundal Height: 42 cm Movement: Present     General:  Alert, oriented and cooperative. Patient is in no acute distress.  Skin: Skin is warm and dry. No rash noted.   Cardiovascular: Normal heart rate noted  Respiratory: Normal respiratory effort, no problems with respiration noted  Abdomen: Soft, gravid, appropriate for gestational age.  Pain/Pressure: Present     Pelvic: Cervical exam deferred        Extremities: Normal range of motion.  Edema: None  Mental Status: Normal mood and affect. Normal behavior. Normal judgment and thought content.   Assessment and Plan:  Pregnancy: G5P0040 at [redacted]w[redacted]d 1. Diet controlled gestational diabetes mellitus (GDM), antepartum No book today, repoerts CBGs in the 80-90 range fasting and 110's 2 hour pp. Continue diet.  2. AMA (advanced maternal age) multigravida 35+,  unspecified trimester LR NIPT  3. Supervision of high risk pregnancy, antepartum   4. Dichorionic diamniotic twin pregnancy in third trimester Concordant but malpresentation noted. C-section booked for 38 wks, if A is Vtx will change to IOL.  5. Grade II hemorrhoids Anusol, continue Colace - hydrocortisone (ANUSOL-HC) 25 MG suppository; Place 1 suppository (25 mg total) rectally 2 (two) times daily.  Dispense: 12 suppository; Refill: 0  Preterm labor symptoms and general obstetric precautions including but not limited to vaginal bleeding, contractions, leaking of fluid and fetal movement were reviewed in detail with the patient. Please refer to After Visit Summary for other counseling recommendations.   Return in about 1 week (around 08/05/2020) for [facility], needs MD, in person.  Future Appointments  Date Time Provider Department Center  07/31/2020  8:30 AM [facility]SE [facility]  07/31/2020  8:45 AM [facility] US5 [facility]  08/06/2020  9:40 AM Alvester Morin, Isa Rankin, MD [facility]    Reva Bores, MD

## 2020-07-30 ENCOUNTER — Encounter: Payer: Self-pay | Admitting: *Deleted

## 2020-07-30 ENCOUNTER — Telehealth: Payer: Self-pay | Admitting: *Deleted

## 2020-07-30 NOTE — Telephone Encounter (Signed)
Call to patient. Voice mail has number confirmation.  Left message calling to advise c-section scheduled for 08-20-20 at 0930 arrive 0730. Also advised will receive letter and additional information in the mail as well as pre-op call.  Call back to [phone] if questions.   Encounter closed.

## 2020-07-31 ENCOUNTER — Other Ambulatory Visit: Payer: Self-pay | Admitting: Obstetrics and Gynecology

## 2020-07-31 ENCOUNTER — Encounter: Payer: Self-pay | Admitting: *Deleted

## 2020-07-31 ENCOUNTER — Ambulatory Visit: Payer: Medicaid Other | Admitting: *Deleted

## 2020-07-31 ENCOUNTER — Other Ambulatory Visit: Payer: Self-pay

## 2020-07-31 ENCOUNTER — Ambulatory Visit: Payer: Medicaid Other | Attending: Obstetrics | Admitting: Obstetrics

## 2020-07-31 ENCOUNTER — Ambulatory Visit: Payer: Medicaid Other | Attending: Obstetrics and Gynecology

## 2020-07-31 VITALS — BP 105/78 | HR 94

## 2020-07-31 DIAGNOSIS — O365931 Maternal care for other known or suspected poor fetal growth, third trimester, fetus 1: Secondary | ICD-10-CM

## 2020-07-31 DIAGNOSIS — O09523 Supervision of elderly multigravida, third trimester: Secondary | ICD-10-CM

## 2020-07-31 DIAGNOSIS — O322XX2 Maternal care for transverse and oblique lie, fetus 2: Secondary | ICD-10-CM

## 2020-07-31 DIAGNOSIS — Z3A35 35 weeks gestation of pregnancy: Secondary | ICD-10-CM

## 2020-07-31 DIAGNOSIS — O30049 Twin pregnancy, dichorionic/diamniotic, unspecified trimester: Secondary | ICD-10-CM

## 2020-07-31 DIAGNOSIS — O24419 Gestational diabetes mellitus in pregnancy, unspecified control: Secondary | ICD-10-CM

## 2020-07-31 DIAGNOSIS — Z8279 Family history of other congenital malformations, deformations and chromosomal abnormalities: Secondary | ICD-10-CM

## 2020-07-31 DIAGNOSIS — O30043 Twin pregnancy, dichorionic/diamniotic, third trimester: Secondary | ICD-10-CM

## 2020-07-31 DIAGNOSIS — Z148 Genetic carrier of other disease: Secondary | ICD-10-CM

## 2020-07-31 DIAGNOSIS — O0990 Supervision of high risk pregnancy, unspecified, unspecified trimester: Secondary | ICD-10-CM | POA: Insufficient documentation

## 2020-07-31 DIAGNOSIS — O09813 Supervision of pregnancy resulting from assisted reproductive technology, third trimester: Secondary | ICD-10-CM | POA: Diagnosis not present

## 2020-07-31 DIAGNOSIS — O321XX1 Maternal care for breech presentation, fetus 1: Secondary | ICD-10-CM

## 2020-07-31 NOTE — Progress Notes (Signed)
MFM Note  This patient was seen for a follow up growth scan due to a dichorionic diamniotic twin gestation.  She denies any problems since her last exam and reports feeling vigorous fetal movements of both fetuses throughout the day.    [name]: EFW 4 pounds 8 ounces (4th percentile for her gestational age).  Normal amniotic fluid.  Doppler studies of the umbilical arteries showed continued normal forward flow.  There were no signs of absent or reversed end-diastolic flow noted.  Biophysical profile 6 out of 8.  She received a -2 for fetal breathing movements that did not meet criteria.  She subsequently had a reactive NST for [name] making her total biophysical profile score 8 out of 10.  [name]: EFW 5 pounds 5 ounce (26th percentile for her gestational age).  Normal amniotic fluid.  Doppler studies of the umbilical arteries showed continued normal forward flow.  There were no signs of absent or reversed end-diastolic flow noted.  Biophysical profile 8 out of 8.  She also had a reactive NST for [name] making her total biophysical profile score 10 out of 10.  Due to fetal growth restriction of [name] in a dichorionic twin gestation, delivery is recommended at around 37 weeks.  We will increase the frequency of fetal testing to twice weekly until delivery.  She will return on Tuesdays to our office for an NST.  We will perform a biophysical profile and umbilical artery Doppler studies on Fridays.  Fetal kick count instructions were reviewed today.  A total of 15 minutes was spent counseling and coordinating the care for this patient.  Greater than 50% of the time was spent in direct face-to-face contact.

## 2020-07-31 NOTE — Procedures (Signed)
Christy Jackson 10/14/1985 [redacted]w[redacted]d   Fetus B Non-Stress Test Interpretation for 07/31/20  Indication: IUGR Baby A  Fetal Heart Rate Fetus B Mode: External Baseline Rate (B): 125 BPM Variability: Moderate Accelerations: 15 x 15 Decelerations: None  Uterine Activity Mode: Palpation, Toco Contraction Frequency (min): ui Contraction Duration (sec): 10 Contraction Quality: Mild Resting Tone Palpated: Relaxed Resting Time: Adequate  Interpretation (Baby B - Fetal Testing) Nonstress Test Interpretation (Baby B): Reactive Overall Impression (Baby B): Reassuring for gestational age Comments (Baby B): Dr. Parke Poisson reviewed tracing.  Christy Jackson [birth year] [redacted]w[redacted]d  Fetus A Non-Stress Test Interpretation for 07/31/20  Indication: IUGR  Fetal Heart Rate A Mode: External Baseline Rate (A): 120 bpm Variability: Moderate Accelerations: 15 x 15 Decelerations: Variable Multiple birth?: Yes  Uterine Activity Mode: Palpation, Toco Contraction Frequency (min): ui Contraction Duration (sec): 10 Contraction Quality: Mild Resting Tone Palpated: Relaxed Resting Time: Adequate  Interpretation (Fetal Testing) Nonstress Test Interpretation: Reactive Overall Impression: Reassuring for gestational age Comments: Dr. Parke Poisson reviewed tracing.

## 2020-08-03 ENCOUNTER — Other Ambulatory Visit: Payer: Self-pay | Admitting: *Deleted

## 2020-08-03 DIAGNOSIS — O30043 Twin pregnancy, dichorionic/diamniotic, third trimester: Secondary | ICD-10-CM

## 2020-08-04 ENCOUNTER — Encounter: Payer: Self-pay | Admitting: *Deleted

## 2020-08-04 ENCOUNTER — Ambulatory Visit: Payer: Medicaid Other | Attending: Obstetrics and Gynecology | Admitting: *Deleted

## 2020-08-04 ENCOUNTER — Other Ambulatory Visit: Payer: Self-pay

## 2020-08-04 ENCOUNTER — Ambulatory Visit (HOSPITAL_BASED_OUTPATIENT_CLINIC_OR_DEPARTMENT_OTHER): Payer: Medicaid Other | Admitting: *Deleted

## 2020-08-04 VITALS — BP 115/72 | HR 85

## 2020-08-04 DIAGNOSIS — Z3A35 35 weeks gestation of pregnancy: Secondary | ICD-10-CM | POA: Insufficient documentation

## 2020-08-04 DIAGNOSIS — O365932 Maternal care for other known or suspected poor fetal growth, third trimester, fetus 2: Secondary | ICD-10-CM | POA: Diagnosis not present

## 2020-08-04 DIAGNOSIS — O365931 Maternal care for other known or suspected poor fetal growth, third trimester, fetus 1: Secondary | ICD-10-CM | POA: Diagnosis not present

## 2020-08-04 DIAGNOSIS — O30043 Twin pregnancy, dichorionic/diamniotic, third trimester: Secondary | ICD-10-CM | POA: Diagnosis present

## 2020-08-04 DIAGNOSIS — O0990 Supervision of high risk pregnancy, unspecified, unspecified trimester: Secondary | ICD-10-CM

## 2020-08-04 DIAGNOSIS — O365930 Maternal care for other known or suspected poor fetal growth, third trimester, not applicable or unspecified: Secondary | ICD-10-CM

## 2020-08-04 NOTE — Procedures (Signed)
Christy Jackson 15-Dec-1985 [redacted]w[redacted]d   Fetus B Non-Stress Test Interpretation for 08/04/20  Indication: IUGR, Di/Di twins  [redacted]w[redacted]d  Fetus A Non-Stress Test Interpretation for 08/04/20  Indication: IUGR, Di/Di twins  Fetal Heart Rate A Mode: External Baseline Rate (A): 130 bpm Variability: Moderate Accelerations: 15 x 15 Decelerations: None Multiple birth?: Yes  Uterine Activity Mode: Palpation, Toco Contraction Duration (sec): ui Contraction Quality: Mild Resting Tone Palpated: Relaxed Resting Time: Adequate  Interpretation (Fetal Testing) Nonstress Test Interpretation: Reactive Comments: Dr. Parke Poisson reviewed tracing.    Fetal Heart Rate Fetus B Mode: External Baseline Rate (B): 130 BPM Variability: Moderate Accelerations: 15 x 15 Decelerations: None  Uterine Activity Mode: Palpation, Toco Contraction Duration (sec): ui Contraction Quality: Mild Resting Tone Palpated: Relaxed Resting Time: Adequate  Interpretation (Baby B - Fetal Testing) Nonstress Test Interpretation (Baby B): Reactive Comments (Baby B): Dr. Parke Poisson reviewed tracing.

## 2020-08-05 ENCOUNTER — Telehealth: Payer: Self-pay | Admitting: *Deleted

## 2020-08-05 NOTE — Telephone Encounter (Signed)
Christy Jackson left a voicemail this afternoon stating she needs to speak to someone about prescription for hemorrhoids. States she is having a lot of pain and medicine is expensive. [name],RN

## 2020-08-06 ENCOUNTER — Ambulatory Visit (INDEPENDENT_AMBULATORY_CARE_PROVIDER_SITE_OTHER): Payer: Medicaid Other | Admitting: Family Medicine

## 2020-08-06 ENCOUNTER — Other Ambulatory Visit: Payer: Self-pay

## 2020-08-06 ENCOUNTER — Other Ambulatory Visit (HOSPITAL_COMMUNITY)
Admission: RE | Admit: 2020-08-06 | Discharge: 2020-08-06 | Disposition: A | Discharge status: Home / Self Care | Payer: Medicaid Other | Source: Ambulatory Visit | Attending: Family Medicine | Admitting: Family Medicine

## 2020-08-06 ENCOUNTER — Encounter: Payer: Self-pay | Admitting: Family Medicine

## 2020-08-06 ENCOUNTER — Encounter: Payer: Self-pay | Admitting: *Deleted

## 2020-08-06 ENCOUNTER — Telehealth: Payer: Self-pay | Admitting: *Deleted

## 2020-08-06 VITALS — BP 116/77 | HR 103 | Wt 220.9 lb

## 2020-08-06 DIAGNOSIS — O0993 Supervision of high risk pregnancy, unspecified, third trimester: Secondary | ICD-10-CM | POA: Diagnosis not present

## 2020-08-06 DIAGNOSIS — O30043 Twin pregnancy, dichorionic/diamniotic, third trimester: Secondary | ICD-10-CM

## 2020-08-06 DIAGNOSIS — O365911 Maternal care for other known or suspected poor fetal growth, first trimester, fetus 1: Secondary | ICD-10-CM

## 2020-08-06 DIAGNOSIS — O0990 Supervision of high risk pregnancy, unspecified, unspecified trimester: Secondary | ICD-10-CM

## 2020-08-06 DIAGNOSIS — O365990 Maternal care for other known or suspected poor fetal growth, unspecified trimester, not applicable or unspecified: Secondary | ICD-10-CM | POA: Insufficient documentation

## 2020-08-06 DIAGNOSIS — O24410 Gestational diabetes mellitus in pregnancy, diet controlled: Secondary | ICD-10-CM

## 2020-08-06 DIAGNOSIS — O09529 Supervision of elderly multigravida, unspecified trimester: Secondary | ICD-10-CM

## 2020-08-06 NOTE — Patient Instructions (Signed)
Preparation H- over the counter for hemorrhoids    AREA PEDIATRIC/FAMILY PRACTICE PHYSICIANS  [place] ([address]) [facility] Deirdre Priest, MD; Lum Babe, MD; Sheffield Slider, MD; Leveda Anna, MD; McDiarmid, MD; Jerene Bears, MD; Jennette Kettle, MD; Gwendolyn Grant, MD [address] [place], [place] [address] [phone] Mon-Fri 8:30-12:30, 1:30-5:00 Providers come to see babies at [facility] Accepting [facility] Family Medicine at [place] Limited providers who accept newborns: Docia Chuck, MD; Kateri Plummer, MD; Paulino Rily, MD [address], [place], [place] [address] [phone] Mon-Fri 8:00-5:30 Babies seen by providers at [facility] Does NOT accept Medicaid Please call early in hospitalization for appointment (limited availability)  [facility] Narrows, MD [address] [place], [place] [address] [phone] Mon, Tue, Thur, Fri 8:30-5:00, Wed 10:00-7:00 (closed 1-2pm) Babies seen by [facility] providers Accepting Medicaid Donnie Coffin - Pediatrician Donnie Coffin, MD [address], [place], [place] [address] [phone] Mon-Fri 8:30-5:00, Sat 8:30-12:00 Provider comes to see babies at [facility] Accepting Medicaid Must have been referred from current patients or contacted office prior to delivery [facility]) Manson Passey, MD; Ave Filter, MD; Luna Fuse, MD; Kennedy Bucker, MD; Konrad Dolores, MD; Kathlene November, MD; Jenne Campus, MD; Lubertha South, MD; Wynetta Emery, MD; Duffy Rhody, MD; Gerre Couch, NP; Shirl Harris, NP [address] [place], [place], [place] [address] [phone] Mon, Halford Decamp, Thur, Fri 8:30-5:30, Wed 9:30-5:30, Sat 8:30-12:30 Babies seen by [facility] providers Accepting Medicaid Only accepting infants of first-time parents or siblings of current patients Hospital discharge coordinator will make follow-up appointment Cyril Mourning [address] [place], [place]   [address] [phone]   Fax - [phone] [facility] [address], [place], [place]  [address] Phone - [phone]   Fax - [phone] Lucio Edward [address] [place], [place]  [address] [phone]  [place]ortheast [place] [phone]) [facility] Jenne Pane, MD; Alita Chyle, MD; Princella Ion, MD; MD; Earlene Plater, MD; Jamesetta Orleans, MD; Alvera Novel, MD; Clarene Duke, MD; Rana Snare, MD; Carmon Ginsberg, MD; Alinda Money, MD; Hosie Poisson, MD; Mayford Knife, MD [address], [place], [place] [address] [phone] Mon-Fri 8:30-5:00 (extended evenings Mon-Thur as needed), Sat-Sun 10:00-1:00 Providers come to see babies at [facility] Accepting Medicaid for families of first-time babies and families with all children in the household age 46 and under. Must register with office prior to making appointment (M-F only). [facility] Suezanne Jacquet, NP; Lynelle Doctor, MD; Susann Givens, MD; [place], [place] [address], [place], [place] [address] [phone] Mon-Fri 8:00-5:00 Babies seen by providers at [facility] Does NOT accept Medicaid/Commercial Insurance Only [facility] [place] [facility])  Holly Bodily, MD; Zachery Dauer, MD; Stefan Church, MD; Sabino Dick, MD; Quitman Livings, MD; Farris Has, MD; Gaynell Face, MD; Betha Loa, MD; Colon Flattery, MD; Clifton James, MD [address] [place], [place] [address] [phone] Mon-Fri 8:30-5:30, Sat (Oct.-Mar.) 9:00-1:00 Babies seen by providers at [facility] Accepting [facility]  [place] [phone]) [facility] Nestle, MD; Sheliah Hatch, MD [address], [place], [place] [address] [phone] Mon-Fri 8:30-5:00, Sat 8:30-12:00 Providers come to see babies at [facility] Does NOT accept [facility] Medicine at Lutricia Feil, [place]; Tracie Harrier, MD; [place], [place]; Wynelle Link, MD; Azucena Cecil, MD [address], [place], [place] [address] [phone] Mon-Fri 8:00-5:00 Babies seen by providers at [facility] Does NOT accept Medicaid Only accepting babies of parents who are patients Please call early in hospitalization for appointment (limited availability) [facility] Chestine Spore, MD; Abran Cantor, MD; Early Osmond, MD; Cherre Huger, NP; Hyacinth Meeker, MD; Dwan Bolt, MD; Jarold Motto, NP; Dario Guardian,  MD; Talmage Nap, MD; Maisie Fus, MD; Pricilla Holm, MD; Tama High, MD [address] [place], [place], [place] [address] [phone] Mon-Fri 8:00-5:00, Sat 9:00-12:00 Providers come to see babies at [facility] Does NOT accept [facility] [phone]) [facility] at [facility] providers accepting new patients: Drema Pry, NP; Delena Serve, PA [address] [place], [place] [address] [phone] Mon-Fri 8:00-5:00 Babies seen by providers at [facility] Does NOT accept Medicaid Only accepting babies of parents who are patients Please call early in hospitalization for appointment (limited availability) [facility] Cardell Peach, MD; Nash Dimmer, MD [address] [place] [place], [place] [address] [phone] (press 1 to schedule appointment) Mon-Fri 8:00-5:00 Providers come to see babies at [facility] Does NOT accept [facility], MD [address], [place], [place] [address] [phone] Mon-Fri 8:30-5:00 (lunch 12:30-1:00), extended hours by appointment only Wed 5:00-6:30 Babies seen by [facility] providers Accepting Medicaid [facility] [place] Carmine, MD; [place], MD; Hassan Rowan, MD [address] [place], [place], [place] [address] [phone] Mon-Fri 8:00-5:00 Babies seen by [facility] providers Does NOT accept Medicaid [facility] Boykin Peek, MD; Durene Cal, MD; [place], [place] [address] [place], [place] [address] [phone] Mon-Fri 8:00-5:00 Babies seen by [facility] providers Does NOT accept [facility], [place]; [place], [place]; [place], [place]; Avis Epley, MD; Vonna Kotyk, MD; Clance Boll,  MD; Stevphen Rochester, NP; Arvilla Market, NP; Ann Maki, NP; Otis Dials, NP; Vaughan Basta, MD; Eartha Inch, MD [address] [place], [place] [address] [phone] Mon-Fri 8:30-5:00, Sat 10:00-1:00 Providers come to see babies at [facility] Does NOT accept Medicaid Free prenatal information session Tuesdays at 4:45pm [facility], MD; [place], [place]; [place], [place]; [place], [place] [address] [place] [address] [phone] Mon-Fri 7:30-5:30 Babies seen by [facility] providers [facility] Doctor [address], [place], [place]  [address] [phone]   Fax - [phone]  [place] [phone] & [phone]) [facility], MD [address], [place], [place] [address] [phone] Mon-Thur 8:00-6:00 Providers come to see babies at [facility] Accepting Medicaid [facility] Dareen Piano, NP; Cyndia Bent, MD; [place], [place]; [place], [place] [address] [place], [place] [address] [phone] Mon-Thur 7:30-7:30, Fri 7:30-4:30 Babies seen by [facility] providers Accepting [facility] Juanito Doom, MD; Janene Harvey, NP; Vonita Moss, MD [address], [place], [place] [address] [phone] Mon-Fri 8:30-5:00, Sat 8:30-12:00 Providers come to see babies at [facility] Accepting Medicaid Must have "Meet & Greet" appointment at office prior to delivery [facility] ([facility] [place] [facility]) Marlow Baars, MD; Earlene Plater, MD; Lucretia Roers, MD [address], [place], [place] [address] [phone] Mon-Wed 8:00-6:00, Thur-Fri 8:00-5:00, Sat 9:00-12:00 Providers come to see babies at [facility] Does NOT accept Medicaid Only accepting siblings of current patients [facility]  [address], [place], [place]  [address] [phone]   Fax - [phone] [facility] Medicine at [facility] [address]  [place], [place]  [address] [phone]   Fax - [phone]  [place] [phone] & [phone]) Adult nurse HealthCare at [facility], [place]; [place], [place] [address] [place], [place] [address] [phone] Mon-Fri 7:00-5:00 Babies seen by [facility] providers Does NOT accept Medicaid [facility] [place], MD; [place], [place]; [place], [place] [address], [place], [place] [address] [phone] Mon-Fri 8:00-5:00 Babies seen by [facility] providers Accepting [facility] - Ardeen Jourdain, MD; [place], [place]; Yetta Barre, NP;  EnoreeOsborn, PA [address], [place], [place] [address] [phone] Mon-Fri 8:00-5:00 Babies seen by providers at [facility] Accepting [facility]  [place] [phone]) [facility] [place] Soso[place]end[place], [place] [address] [place], [place] [address] [phone] Mon-Fri 8:00-5:00 Babies seen by [facility] providers Does NOT accept Medicaid Limited availability, please call early in hospitalization to schedule follow-up [facility] Jeanelle Mallingalderon, [place]; Eddie Candleummings, MD; Normand Sloopillard, MD; MirrormontMartin, [place]; Constance Goltzlson, MD; Rose LodgeVanDeven, [place] [phone] [facility] [address], [place], [place] [address] [phone] Mon-Fri 8:30-5:00, Sat 9:00-12:00 Babies seen by providers at [facility] Accepting Medicaid Please register online then schedule online or call office [email or web address] [facility]) Durene CalHunter, NP; Lucianne MussKumar, MD; Lanier ClamMartin Rogers, [place] [id number], [place], [place] [address] [phone] Mon-Fri 8:00-5:00 Babies seen by providers at [facility] Accepting [facility]edicaid [facility] ([facility]) ClaypoolDurham, MD; Reed BreechKristi Fleenor, NP; Shelva MajesticWest, MD [address], [place], [place] [address] [phone] Mon-Fri  8:00-5:30, Sat&Sun by appointment (phones open at 8:30) Babies seen by [facility] providers Accepting Medicaid Must be a first-time baby or sibling of current patient [facility] [place]  [address], [address], [place], [place]  [phone]   Fax - [phone]  High [address] [phone]) [facility] [place], [place]; [place], [place]; [place], MD; Roll[place], [place]; Carolyne FiscalSpry, MD [address] [place], [place] [address] [phone] Mon-Thur 8:00-7:00, Fri 8:00-5:00, Sat 8:00-12:00, Sun 9:00-12:00 Babies seen by [facility] providers Accepting Medicaid [facility] - Family Medicine at [place]iana Ger[place] Coe-Goins, MD; Gaynell FaceMarshall, MD; [facility]re-Louis, MD [address], [place], [place] [address] [phone] Mon-Thur 8:00-5:00 Babies seen by providers at [facility] Accepting Medicaid [facility] - Family Medicine at Dorthey Sawyerommerce Bratton, MD; Coe-Goins, MD; Madilyn FiremanHayes, MD; Melvyn NethLewis, MD; List, MD; Lazarus SalinesLott, MD; Gaynell FaceMarshall, MD; Berneda RoseMoran, MD; Flora Lipps'Neal, MD; Beryl MeagerPierre-Louis, MD; Luther RedoPitonzo, MD; Lavonia DraftsScholer, MD; Kellie SimmeringSpangle, MD [address] [place], [place] [address] [phone] Mon-Fri 8:00-5:30, Sat (Oct.-Mar.) 9:00-1:00 Babies seen by providers at [facility] Accepting Medicaid Must fill out new patient packet, available online at [email or web address] [facility] - [address] [facility] at [facility] [address]) Spero GeraldsFriddle, NP; Tiburcio PeaHarris, NP; Tresa EndoKelly, NP; Whitney PostLogan, MD; [place], [place]; Hennie DuosPoth, MD; JenkinsburgRamadoss, MD; Kavin LeechStanton, NP [address], [place], [place] [address] [phone] Mon-Thur 8:00-5:30, Fri 8:00-5:00 Babies seen by providers at [facility] Accepting [facility]edicaid  [place] [phone]) [facility] FordsDixon, [place]; MoorheadDurham, MD; LemitarPickard, MD; Jericho[place]ia, [place] [address], [place], [place] [address] [phone] Mon-Fri 8:00-5:00 Babies seen by providers at [facility] Accepting [facility] MonroeMedicaid   [place] [phone]) [facility] at [facility] Masneri, [place]; Lenise ArenaMeyers, MD; MarcyNelson, [place] [address], [place], [place] [address] [phone] Mon-Fri 8:00-5:00 Babies seen by providers at [facility] Does NOT accept Medicaid Limited appointment availability, please call early in hospitalization  [place] [facility] Kunedd, [place]; BelvidereMcGowen, MD [address], [place], [place] [address] [phone] Mon-Fri 8:00-5:00 Babies seen by [facility] providers Does NOT accept [facility] Special[facility] HospitalMedicaid [facility] - [facility] Cameron, MD; Ninetta LightsMacDonald, MD; EllsworthMichaels, [place]; La CroftNayak, MD [address], [place], [place] [address] [phone] Mon-Fri 8:00-5:00 After hours clinic [facility]([address] [place], [place] [address]) [phone] Mon-Fri 5:00-8:00, Sat 12:00-6:00, Sun 10:00-4:00 Babies seen by [facility] providers  Accepting Medicaid [facility]. [address], [place], [place]  [address] [phone]   Fax - [phone]  [place] [phone]) Adult nurse Health[facility] at [facility], MD [address] [place], [place], [place] [address] [phone] Mon-Fri 8:00-5:00 Babies seen by [facility] providers Does NOT accept Medicaid [facility] - [address] [facility] at [facility]) Rene Kocher, MD [address], [place], [place] [address] [phone] Mon-Thur 8:00-7:00, Fri 8:00-5:00, Sat 8:00-12:00 Babies seen by providers at [facility] Accepting Medicaid - but does not have vaccinations in office (must be received elsewhere) Limited availability, please call early in hospitalization  Martin [phone]) [facility]  Wyvonne Lenz, MD [address], [place] [address] [phone]  Fax [phone]

## 2020-08-06 NOTE — Telephone Encounter (Signed)
Call to patient. Advised new date for c-section is 08-13-20 at 1230, arrive 1030.   Encounter closed.

## 2020-08-06 NOTE — Telephone Encounter (Signed)
I called Dilia and left a message I was calling back to discuss her request and since I did not reach her I will send a detailed Mychart message- if you still have questions feel free to send a message or call us. Zniyah Midkiff,RN

## 2020-08-06 NOTE — Progress Notes (Signed)
   PRENATAL VISIT NOTE  Subjective:  Oakleigh Buonocore is a 35 y.o. G5P0040 at [redacted]w[redacted]d being seen today for ongoing prenatal care.  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  Patient reports no complaints.  Contractions: Irritability. Vag. Bleeding: None.  Movement: Present. Denies leaking of fluid.   The following portions of the patient's history were reviewed and updated as appropriate: allergies, current medications, past family history, past medical history, past social history, past surgical history and problem list.   Objective:   Vitals:   08/06/20 1009  BP: 116/77  Pulse: (!) 103  Weight: 220 lb 14.4 oz (100.2 kg)    Fetal Status: Fetal Heart Rate (bpm): 128/127   Movement: Present     General:  Alert, oriented and cooperative. Patient is in no acute distress.  Skin: Skin is warm and dry. No rash noted.   Cardiovascular: Normal heart rate noted  Respiratory: Normal respiratory effort, no problems with respiration noted  Abdomen: Soft, gravid, appropriate for gestational age.  Pain/Pressure: Present     Pelvic: Cervical exam deferred        Extremities: Normal range of motion.  Edema: None  Mental Status: Normal mood and affect. Normal behavior. Normal judgment and thought content.   Assessment and Plan:  Pregnancy: G5P0040 at 102w0d 1. Diet controlled gestational diabetes mellitus (GDM), antepartum Well controlled   2. Supervision of high risk pregnancy, antepartum Up to date Ready for  babies Discussed timing of delivery today -- MFM recommends 37 wks [name] is breech  3. AMA (advanced maternal age) multigravida 35+, unspecified trimester LR NIP  4. Dichorionic diamniotic twin pregnancy in third trimester [name] is breech and B transverse Discussed mode of delivery- requested CS for 37 weeks 2/2 to IUGR in [name]  Preterm labor symptoms and general obstetric precautions including but not limited to vaginal bleeding, contractions, leaking of fluid and fetal movement were reviewed in detail with the patient. Please refer to After Visit Summary for other counseling recommendations.   Return in about 6 weeks (around 09/17/2020) for Postpartum visit.  Future Appointments  Date Time Provider Department Center  08/11/2020 10:00 AM MC-LD PAT 1 MC-INDC None  08/11/2020 11:05 AM MC-SCREENING MC-SDSC None    Federico Flake, MD

## 2020-08-07 ENCOUNTER — Ambulatory Visit: Payer: Medicaid Other | Attending: Obstetrics | Admitting: *Deleted

## 2020-08-07 ENCOUNTER — Ambulatory Visit (HOSPITAL_BASED_OUTPATIENT_CLINIC_OR_DEPARTMENT_OTHER): Payer: Medicaid Other

## 2020-08-07 ENCOUNTER — Encounter: Payer: Self-pay | Admitting: *Deleted

## 2020-08-07 ENCOUNTER — Encounter (HOSPITAL_COMMUNITY): Payer: Self-pay

## 2020-08-07 VITALS — BP 116/77 | HR 79

## 2020-08-07 DIAGNOSIS — O321XX1 Maternal care for breech presentation, fetus 1: Secondary | ICD-10-CM | POA: Diagnosis not present

## 2020-08-07 DIAGNOSIS — O09523 Supervision of elderly multigravida, third trimester: Secondary | ICD-10-CM

## 2020-08-07 DIAGNOSIS — O30043 Twin pregnancy, dichorionic/diamniotic, third trimester: Secondary | ICD-10-CM

## 2020-08-07 DIAGNOSIS — O09819 Supervision of pregnancy resulting from assisted reproductive technology, unspecified trimester: Secondary | ICD-10-CM | POA: Insufficient documentation

## 2020-08-07 DIAGNOSIS — Z3A36 36 weeks gestation of pregnancy: Secondary | ICD-10-CM | POA: Diagnosis not present

## 2020-08-07 DIAGNOSIS — Z148 Genetic carrier of other disease: Secondary | ICD-10-CM

## 2020-08-07 DIAGNOSIS — O09813 Supervision of pregnancy resulting from assisted reproductive technology, third trimester: Secondary | ICD-10-CM

## 2020-08-07 DIAGNOSIS — O24419 Gestational diabetes mellitus in pregnancy, unspecified control: Secondary | ICD-10-CM | POA: Insufficient documentation

## 2020-08-07 DIAGNOSIS — Z8279 Family history of other congenital malformations, deformations and chromosomal abnormalities: Secondary | ICD-10-CM | POA: Insufficient documentation

## 2020-08-07 DIAGNOSIS — O0990 Supervision of high risk pregnancy, unspecified, unspecified trimester: Secondary | ICD-10-CM

## 2020-08-07 DIAGNOSIS — O322XX2 Maternal care for transverse and oblique lie, fetus 2: Secondary | ICD-10-CM | POA: Diagnosis not present

## 2020-08-07 LAB — GC/CHLAMYDIA PROBE AMP (~~LOC~~) NOT AT ARMC
Chlamydia: NEGATIVE
Comment: NEGATIVE
Comment: NORMAL
Neisseria Gonorrhea: NEGATIVE

## 2020-08-07 NOTE — Patient Instructions (Signed)
Kumari Vasconcelos  08/07/2020   Your procedure is scheduled on:  08/13/2020  Arrive at 1030 at Entrance C on [employer] [facility]  and [employer]. You are invited to use the FREE valet parking or use the Visitor's parking deck.  Pick up the phone at the desk and dial [phone].  Call this number if you have problems the morning of surgery: [phone]  Remember:   Do not eat food:(After Midnight) Desps de medianoche.  Do not drink clear liquids: (After Midnight) Desps de medianoche.  Take these medicines the morning of surgery with A SIP OF WATER:  none   Do not wear jewelry, make-up or nail polish.  Do not wear lotions, powders, or perfumes. Do not wear deodorant.  Do not shave 48 hours prior to surgery.  Do not bring valuables to the hospital.  [facility] is not   responsible for any belongings or valuables brought to the hospital.  Contacts, dentures or bridgework may not be worn into surgery.  Leave suitcase in the car. After surgery it may be brought to your room.  For patients admitted to the hospital, checkout time is 11:00 AM the day of              discharge.      Please read over the following fact sheets that you were given:     Preparing for Surgery

## 2020-08-10 ENCOUNTER — Other Ambulatory Visit: Payer: Self-pay | Admitting: Obstetrics & Gynecology

## 2020-08-10 ENCOUNTER — Encounter (HOSPITAL_COMMUNITY): Payer: Self-pay | Admitting: Obstetrics and Gynecology

## 2020-08-10 ENCOUNTER — Encounter (HOSPITAL_COMMUNITY)
Admission: AD | Disposition: A | Discharge status: Home / Self Care | Payer: Self-pay | Source: Home / Self Care | Attending: Obstetrics and Gynecology

## 2020-08-10 ENCOUNTER — Inpatient Hospital Stay (HOSPITAL_COMMUNITY)
Admission: AD | Admit: 2020-08-10 | Discharge: 2020-08-13 | DRG: 786 | Disposition: A | Discharge status: Home / Self Care | Payer: Medicaid Other | Attending: Obstetrics and Gynecology | Admitting: Obstetrics and Gynecology

## 2020-08-10 ENCOUNTER — Other Ambulatory Visit: Payer: Self-pay

## 2020-08-10 ENCOUNTER — Inpatient Hospital Stay (HOSPITAL_COMMUNITY): Payer: Medicaid Other | Admitting: Certified Registered Nurse Anesthetist

## 2020-08-10 ENCOUNTER — Telehealth: Payer: Self-pay | Admitting: Lactation Services

## 2020-08-10 DIAGNOSIS — O30049 Twin pregnancy, dichorionic/diamniotic, unspecified trimester: Secondary | ICD-10-CM | POA: Diagnosis present

## 2020-08-10 DIAGNOSIS — O321XX1 Maternal care for breech presentation, fetus 1: Secondary | ICD-10-CM

## 2020-08-10 DIAGNOSIS — O09529 Supervision of elderly multigravida, unspecified trimester: Secondary | ICD-10-CM

## 2020-08-10 DIAGNOSIS — Z3A36 36 weeks gestation of pregnancy: Secondary | ICD-10-CM

## 2020-08-10 DIAGNOSIS — O30043 Twin pregnancy, dichorionic/diamniotic, third trimester: Secondary | ICD-10-CM | POA: Diagnosis present

## 2020-08-10 DIAGNOSIS — Z8616 Personal history of COVID-19: Secondary | ICD-10-CM

## 2020-08-10 DIAGNOSIS — O24420 Gestational diabetes mellitus in childbirth, diet controlled: Secondary | ICD-10-CM

## 2020-08-10 DIAGNOSIS — Z8279 Family history of other congenital malformations, deformations and chromosomal abnormalities: Secondary | ICD-10-CM | POA: Diagnosis not present

## 2020-08-10 DIAGNOSIS — O365930 Maternal care for other known or suspected poor fetal growth, third trimester, not applicable or unspecified: Secondary | ICD-10-CM

## 2020-08-10 DIAGNOSIS — O365931 Maternal care for other known or suspected poor fetal growth, third trimester, fetus 1: Secondary | ICD-10-CM | POA: Diagnosis present

## 2020-08-10 DIAGNOSIS — Z20822 Contact with and (suspected) exposure to covid-19: Secondary | ICD-10-CM | POA: Diagnosis present

## 2020-08-10 DIAGNOSIS — O365990 Maternal care for other known or suspected poor fetal growth, unspecified trimester, not applicable or unspecified: Secondary | ICD-10-CM | POA: Diagnosis present

## 2020-08-10 DIAGNOSIS — O352XX0 Maternal care for (suspected) hereditary disease in fetus, not applicable or unspecified: Secondary | ICD-10-CM | POA: Diagnosis present

## 2020-08-10 DIAGNOSIS — O321XX2 Maternal care for breech presentation, fetus 2: Secondary | ICD-10-CM | POA: Diagnosis present

## 2020-08-10 DIAGNOSIS — O42013 Preterm premature rupture of membranes, onset of labor within 24 hours of rupture, third trimester: Secondary | ICD-10-CM

## 2020-08-10 DIAGNOSIS — O42913 Preterm premature rupture of membranes, unspecified as to length of time between rupture and onset of labor, third trimester: Principal | ICD-10-CM | POA: Diagnosis present

## 2020-08-10 DIAGNOSIS — U071 COVID-19: Secondary | ICD-10-CM | POA: Diagnosis present

## 2020-08-10 DIAGNOSIS — O24419 Gestational diabetes mellitus in pregnancy, unspecified control: Secondary | ICD-10-CM | POA: Diagnosis present

## 2020-08-10 LAB — CBC
HCT: 38.3 % (ref 36.0–46.0)
Hemoglobin: 12.9 g/dL (ref 12.0–15.0)
MCH: 29.9 pg (ref 26.0–34.0)
MCHC: 33.7 g/dL (ref 30.0–36.0)
MCV: 88.7 fL (ref 80.0–100.0)
Platelets: 210 10*3/uL (ref 150–400)
RBC: 4.32 MIL/uL (ref 3.87–5.11)
RDW: 13.1 % (ref 11.5–15.5)
WBC: 5.9 10*3/uL (ref 4.0–10.5)
nRBC: 0 % (ref 0.0–0.2)

## 2020-08-10 LAB — TYPE AND SCREEN
ABO/RH(D): O POS
Antibody Screen: NEGATIVE

## 2020-08-10 LAB — RESP PANEL BY RT-PCR (FLU A&B, COVID) ARPGX2
Influenza A by PCR: NEGATIVE
Influenza B by PCR: NEGATIVE
SARS Coronavirus 2 by RT PCR: NEGATIVE

## 2020-08-10 LAB — POCT FERN TEST: POCT Fern Test: POSITIVE

## 2020-08-10 LAB — CULTURE, BETA STREP (GROUP B ONLY): Strep Gp B Culture: NEGATIVE

## 2020-08-10 LAB — RPR: RPR Ser Ql: NONREACTIVE

## 2020-08-10 LAB — GLUCOSE, CAPILLARY: Glucose-Capillary: 95 mg/dL (ref 70–99)

## 2020-08-10 SURGERY — Surgical Case
Anesthesia: Spinal

## 2020-08-10 MED ORDER — PHENYLEPHRINE HCL-NACL 20-0.9 MG/250ML-% IV SOLN
INTRAVENOUS | Status: DC | PRN
  Administered 2020-08-10: 60 ug/min via INTRAVENOUS

## 2020-08-10 MED ORDER — MENTHOL 3 MG MT LOZG: 1.0000 | LOZENGE | OROMUCOSAL | Status: DC | PRN

## 2020-08-10 MED ORDER — MEPERIDINE HCL 25 MG/ML IJ SOLN: 6.2500 mg | INTRAMUSCULAR | Status: DC | PRN

## 2020-08-10 MED ORDER — PHENYLEPHRINE HCL-NACL 20-0.9 MG/250ML-% IV SOLN
INTRAVENOUS | Status: AC
  Filled 2020-08-10: qty 250

## 2020-08-10 MED ORDER — DIBUCAINE (PERIANAL) 1 % EX OINT: 1.0000 "application " | TOPICAL_OINTMENT | CUTANEOUS | Status: DC | PRN

## 2020-08-10 MED ORDER — OXYTOCIN-SODIUM CHLORIDE 30-0.9 UT/500ML-% IV SOLN
INTRAVENOUS | Status: AC
  Filled 2020-08-10: qty 500

## 2020-08-10 MED ORDER — KETOROLAC TROMETHAMINE 30 MG/ML IJ SOLN
30.0000 mg | Freq: Four times a day (QID) | INTRAMUSCULAR | Status: AC
  Administered 2020-08-11 (×3): 30 mg via INTRAVENOUS
  Filled 2020-08-10 (×3): qty 1

## 2020-08-10 MED ORDER — ACETAMINOPHEN 500 MG PO TABS
1000.0000 mg | ORAL_TABLET | Freq: Four times a day (QID) | ORAL | Status: DC
  Administered 2020-08-11 – 2020-08-13 (×10): 1000 mg via ORAL
  Filled 2020-08-10 (×10): qty 2

## 2020-08-10 MED ORDER — SODIUM CHLORIDE 0.9 % IV SOLN: INTRAVENOUS | Status: DC | PRN

## 2020-08-10 MED ORDER — PRENATAL MULTIVITAMIN CH
1.0000 | ORAL_TABLET | Freq: Every day | ORAL | Status: DC
  Administered 2020-08-11 – 2020-08-13 (×3): 1 via ORAL
  Filled 2020-08-10 (×3): qty 1

## 2020-08-10 MED ORDER — DIPHENHYDRAMINE HCL 25 MG PO CAPS: 25.0000 mg | ORAL_CAPSULE | ORAL | Status: DC | PRN

## 2020-08-10 MED ORDER — WITCH HAZEL-GLYCERIN EX PADS: 1.0000 "application " | MEDICATED_PAD | CUTANEOUS | Status: DC | PRN

## 2020-08-10 MED ORDER — ONDANSETRON HCL 4 MG/2ML IJ SOLN
INTRAMUSCULAR | Status: DC | PRN
  Administered 2020-08-10: 4 mg via INTRAVENOUS

## 2020-08-10 MED ORDER — NALOXONE HCL 0.4 MG/ML IJ SOLN: 0.4000 mg | INTRAMUSCULAR | Status: DC | PRN

## 2020-08-10 MED ORDER — SODIUM CHLORIDE 0.9% FLUSH: 3.0000 mL | INTRAVENOUS | Status: DC | PRN

## 2020-08-10 MED ORDER — ONDANSETRON HCL 4 MG/2ML IJ SOLN
4.0000 mg | Freq: Three times a day (TID) | INTRAMUSCULAR | Status: DC | PRN
  Administered 2020-08-11: 4 mg via INTRAVENOUS
  Filled 2020-08-10: qty 2

## 2020-08-10 MED ORDER — LACTATED RINGERS IV SOLN: INTRAVENOUS | Status: DC

## 2020-08-10 MED ORDER — SIMETHICONE 80 MG PO CHEW: 80.0000 mg | CHEWABLE_TABLET | ORAL | Status: DC | PRN

## 2020-08-10 MED ORDER — IBUPROFEN 600 MG PO TABS
600.0000 mg | ORAL_TABLET | Freq: Four times a day (QID) | ORAL | Status: DC
  Administered 2020-08-12 – 2020-08-13 (×8): 600 mg via ORAL
  Filled 2020-08-10 (×8): qty 1

## 2020-08-10 MED ORDER — POVIDONE-IODINE 10 % EX SWAB: 2.0000 "application " | Freq: Once | CUTANEOUS | Status: DC

## 2020-08-10 MED ORDER — FENTANYL CITRATE (PF) 100 MCG/2ML IJ SOLN
INTRAMUSCULAR | Status: DC | PRN
  Administered 2020-08-10: 15 ug via INTRATHECAL

## 2020-08-10 MED ORDER — SCOPOLAMINE 1 MG/3DAYS TD PT72
1.0000 | MEDICATED_PATCH | Freq: Once | TRANSDERMAL | Status: DC
  Administered 2020-08-11: 1.5 mg via TRANSDERMAL
  Filled 2020-08-10: qty 1

## 2020-08-10 MED ORDER — DEXAMETHASONE SODIUM PHOSPHATE 10 MG/ML IJ SOLN
INTRAMUSCULAR | Status: DC | PRN
  Administered 2020-08-10: 10 mg via INTRAVENOUS

## 2020-08-10 MED ORDER — SOD CITRATE-CITRIC ACID 500-334 MG/5ML PO SOLN
30.0000 mL | Freq: Once | ORAL | Status: AC
  Administered 2020-08-10: 30 mL via ORAL
  Filled 2020-08-10: qty 30

## 2020-08-10 MED ORDER — OXYCODONE HCL 5 MG PO TABS
5.0000 mg | ORAL_TABLET | ORAL | Status: DC | PRN
  Administered 2020-08-12: 10 mg via ORAL
  Filled 2020-08-10: qty 2

## 2020-08-10 MED ORDER — NALBUPHINE HCL 10 MG/ML IJ SOLN: 5.0000 mg | INTRAMUSCULAR | Status: DC | PRN

## 2020-08-10 MED ORDER — NALBUPHINE HCL 10 MG/ML IJ SOLN
5.0000 mg | Freq: Once | INTRAMUSCULAR | Status: DC | PRN
  Filled 2020-08-10: qty 1

## 2020-08-10 MED ORDER — CEFAZOLIN SODIUM-DEXTROSE 2-4 GM/100ML-% IV SOLN
2.0000 g | INTRAVENOUS | Status: AC
  Administered 2020-08-10: 2 g via INTRAVENOUS
  Filled 2020-08-10: qty 100

## 2020-08-10 MED ORDER — SENNOSIDES-DOCUSATE SODIUM 8.6-50 MG PO TABS
2.0000 | ORAL_TABLET | Freq: Every day | ORAL | Status: DC
  Administered 2020-08-11 – 2020-08-13 (×3): 2 via ORAL
  Filled 2020-08-10 (×3): qty 2

## 2020-08-10 MED ORDER — KETOROLAC TROMETHAMINE 30 MG/ML IJ SOLN
30.0000 mg | Freq: Four times a day (QID) | INTRAMUSCULAR | Status: DC | PRN
  Administered 2020-08-11: 30 mg via INTRAVENOUS

## 2020-08-10 MED ORDER — OXYTOCIN-SODIUM CHLORIDE 30-0.9 UT/500ML-% IV SOLN
INTRAVENOUS | Status: DC | PRN
  Administered 2020-08-10: 30 [IU] via INTRAVENOUS

## 2020-08-10 MED ORDER — OXYTOCIN-SODIUM CHLORIDE 30-0.9 UT/500ML-% IV SOLN
2.5000 [IU]/h | INTRAVENOUS | Status: AC
  Administered 2020-08-11: 2.5 [IU]/h via INTRAVENOUS
  Filled 2020-08-10: qty 500

## 2020-08-10 MED ORDER — TETANUS-DIPHTH-ACELL PERTUSSIS 5-2.5-18.5 LF-MCG/0.5 IM SUSY: 0.5000 mL | PREFILLED_SYRINGE | Freq: Once | INTRAMUSCULAR | Status: DC

## 2020-08-10 MED ORDER — KETOROLAC TROMETHAMINE 30 MG/ML IJ SOLN: 30.0000 mg | Freq: Four times a day (QID) | INTRAMUSCULAR | Status: DC | PRN

## 2020-08-10 MED ORDER — MORPHINE SULFATE (PF) 0.5 MG/ML IJ SOLN
INTRAMUSCULAR | Status: AC
  Filled 2020-08-10: qty 10

## 2020-08-10 MED ORDER — DIPHENHYDRAMINE HCL 25 MG PO CAPS: 25.0000 mg | ORAL_CAPSULE | Freq: Four times a day (QID) | ORAL | Status: DC | PRN

## 2020-08-10 MED ORDER — NALBUPHINE HCL 10 MG/ML IJ SOLN
5.0000 mg | INTRAMUSCULAR | Status: DC | PRN
  Administered 2020-08-12: 5 mg via INTRAVENOUS

## 2020-08-10 MED ORDER — ONDANSETRON HCL 4 MG/2ML IJ SOLN
INTRAMUSCULAR | Status: AC
  Filled 2020-08-10: qty 2

## 2020-08-10 MED ORDER — COCONUT OIL OIL: 1.0000 "application " | TOPICAL_OIL | Status: DC | PRN

## 2020-08-10 MED ORDER — MORPHINE SULFATE (PF) 0.5 MG/ML IJ SOLN
INTRAMUSCULAR | Status: DC | PRN
  Administered 2020-08-10: .15 mg via INTRATHECAL

## 2020-08-10 MED ORDER — NALOXONE HCL 4 MG/10ML IJ SOLN
1.0000 ug/kg/h | INTRAVENOUS | Status: DC | PRN
  Filled 2020-08-10: qty 5

## 2020-08-10 MED ORDER — FENTANYL CITRATE (PF) 100 MCG/2ML IJ SOLN
INTRAMUSCULAR | Status: AC
  Filled 2020-08-10: qty 2

## 2020-08-10 MED ORDER — TERBUTALINE SULFATE 1 MG/ML IJ SOLN
0.2500 mg | Freq: Once | INTRAMUSCULAR | Status: AC
  Administered 2020-08-10: 0.25 mg via SUBCUTANEOUS
  Filled 2020-08-10: qty 1

## 2020-08-10 MED ORDER — DIPHENHYDRAMINE HCL 50 MG/ML IJ SOLN: 12.5000 mg | INTRAMUSCULAR | Status: DC | PRN

## 2020-08-10 MED ORDER — NALBUPHINE HCL 10 MG/ML IJ SOLN: 5.0000 mg | Freq: Once | INTRAMUSCULAR | Status: DC | PRN

## 2020-08-10 MED ORDER — BUPIVACAINE IN DEXTROSE 0.75-8.25 % IT SOLN
INTRATHECAL | Status: DC | PRN
  Administered 2020-08-10: 1.6 mL via INTRATHECAL

## 2020-08-10 MED ORDER — OXYTOCIN-SODIUM CHLORIDE 30-0.9 UT/500ML-% IV SOLN: INTRAVENOUS | Status: DC | PRN

## 2020-08-10 MED ORDER — ENOXAPARIN SODIUM 60 MG/0.6ML IJ SOSY
0.5000 mg/kg | PREFILLED_SYRINGE | INTRAMUSCULAR | Status: DC
  Administered 2020-08-11 – 2020-08-12 (×2): 50 mg via SUBCUTANEOUS
  Filled 2020-08-10 (×2): qty 0.6

## 2020-08-10 MED ORDER — SODIUM CHLORIDE 0.9 % IV SOLN
500.0000 mg | Freq: Once | INTRAVENOUS | Status: DC
  Filled 2020-08-10: qty 500

## 2020-08-10 MED ORDER — SIMETHICONE 80 MG PO CHEW
80.0000 mg | CHEWABLE_TABLET | Freq: Three times a day (TID) | ORAL | Status: DC
  Administered 2020-08-11 – 2020-08-13 (×8): 80 mg via ORAL
  Filled 2020-08-10 (×8): qty 1

## 2020-08-10 SURGICAL SUPPLY — 30 items
BENZOIN TINCTURE PRP APPL 2/3 (GAUZE/BANDAGES/DRESSINGS) ×2 IMPLANT
CHLORAPREP W/TINT 26ML (MISCELLANEOUS) ×2 IMPLANT
CLAMP CORD UMBIL (MISCELLANEOUS) IMPLANT
CLOSURE STERI STRIP 1/2 X4 (GAUZE/BANDAGES/DRESSINGS) ×2 IMPLANT
CLOTH BEACON ORANGE TIMEOUT ST (SAFETY) ×2 IMPLANT
DRAIN JACKSON PRT FLT 7MM (DRAIN) IMPLANT
DRSG OPSITE POSTOP 4X10 (GAUZE/BANDAGES/DRESSINGS) ×2 IMPLANT
ELECT REM PT RETURN 9FT ADLT (ELECTROSURGICAL) ×2
ELECTRODE REM PT RTRN 9FT ADLT (ELECTROSURGICAL) ×1 IMPLANT
EVACUATOR SILICONE 100CC (DRAIN) IMPLANT
EXTRACTOR VACUUM M CUP 4 TUBE (SUCTIONS) IMPLANT
GLOVE BIO SURGEON STRL SZ7 (GLOVE) ×2 IMPLANT
GLOVE BIOGEL PI IND STRL 7.0 (GLOVE) ×2 IMPLANT
GLOVE BIOGEL PI INDICATOR 7.0 (GLOVE) ×2
GOWN STRL REUS W/TWL LRG LVL3 (GOWN DISPOSABLE) ×4 IMPLANT
KIT ABG SYR 3ML LUER SLIP (SYRINGE) IMPLANT
NEEDLE HYPO 25X5/8 SAFETYGLIDE (NEEDLE) ×2 IMPLANT
NS IRRIG 1000ML POUR BTL (IV SOLUTION) ×2 IMPLANT
PACK C SECTION WH (CUSTOM PROCEDURE TRAY) ×2 IMPLANT
PAD OB MATERNITY 4.3X12.25 (PERSONAL CARE ITEMS) ×2 IMPLANT
PENCIL SMOKE EVAC W/HOLSTER (ELECTROSURGICAL) ×2 IMPLANT
RTRCTR C-SECT PINK 25CM LRG (MISCELLANEOUS) ×2 IMPLANT
STRIP CLOSURE SKIN 1/2X4 (GAUZE/BANDAGES/DRESSINGS) ×2 IMPLANT
SUT VIC AB 0 CTX 36 (SUTURE) ×5
SUT VIC AB 0 CTX36XBRD ANBCTRL (SUTURE) ×5 IMPLANT
SUT VIC AB 4-0 KS 27 (SUTURE) ×2 IMPLANT
SYR KIT LINE DRAW 1CC W/FILTR (LINER) ×2 IMPLANT
TOWEL OR 17X24 6PK STRL BLUE (TOWEL DISPOSABLE) ×2 IMPLANT
TRAY FOLEY W/BAG SLVR 14FR LF (SET/KITS/TRAYS/PACK) ×2 IMPLANT
WATER STERILE IRR 1000ML POUR (IV SOLUTION) ×4 IMPLANT

## 2020-08-10 NOTE — Discharge Summary (Signed)
Postpartum Discharge Summary     Patient Name: Christy Jackson DOB: 1985/04/01 MRN: [id number]  Date of admission: 08/10/2020 Delivery date:   Raelene, Trew Girl Lenee [id number]  08/10/2020    Jonet, Mathies Seaira [id number]  08/10/2020  Delivering provider:    Ceasar Lund, Girl Livy [phone]  Iracema, Lanagan Tareka [phone]  Aletha Halim  Date of discharge: 08/13/2020  Admitting diagnosis: Dichorionic diamniotic twin pregnancy in third trimester [O30.043] Intrauterine pregnancy: [redacted]w[redacted]d    Secondary diagnosis:  Principal Problem:   Cesarean delivery delivered Active Problems:   Twin gestation, dichorionic diamniotic   Family history of Down syndrome   AMA (advanced maternal age) multigravida 315+ unspecified trimester   COVID-19 affecting pregnancy in first trimester   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 yo G5P0040 at 37w4das admitted in Latent Labor s/p PPROM at 192n 08/10/2020. Pt was found to have cervical dilation to 4cm on admission. The patient went for cesarean section due to  Malpresentation of [name] in setting of preterm labor s/p PPROM . Delivery details as follows: Membrane Rupture Time/Date: Shaquisha, Wynnirl Emmaly [[id number]:00 PM    MuAmaryllis, Malmquistabab [[id number]:59 PM ,   MuNahal, Wanlessirl Reba [[id number]/27/2022    MuDenetria, Luevanosabab [[id number]/27/2022   Delivery Method:   MuStarlina, Lapreirl Athelene [[id number]-Section, Low Transverse    MuSutton, Plakeabab [[id number]-Section, Low Transverse  Details  of operation can be found in separate operative note. Patient had an uncomplicated postpartum course.  She is ambulating,tolerating a regular diet, passing flatus, and urinating well.  Her creatinine was elevated on POD#1 at 0.83 and repeat on POD#2 was trending down at 0.77. Her fasting glucose on POD#2 was 100. Patient is discharged home in stable condition 08/13/20.  Newborn Data: Birth date:   MuKalayna, Noyirl Emmogene [[id number]/27/2022    MuFawnda, Vitulloabab [[id number]/27/2022  Birth time:   MuIyona, Pehrsonirl Aasha [[id number]:01 PM    MuShaiann, Mcmanamonabab [[id number]:03 PM  Gender:   MuAdalynn, Corneirl Denetra [[id number]    MuDaianna, Vasquesabab [[id number]  Living status:   MuPavlovichGirl Artice [[id number]    MuLouella, Medagliaabab [[id number]  Apgars:   MuAdely, Facerirl Corinne [[id number] [address] [[id number] ,   MuGabbertGirl Thomasenia [[id number] [address] [[id number]  Weight:   MuHeinrichsGirl Kaithlyn [[id number] g    MuKanyia, Heaslipabab [[id number] g   Magnesium Sulfate received: No BMZ received: No Rhophylac:N/A MMR:N/A T-DaP:Given prenatally Flu: no Transfusion:No  Physical exam  Vitals:   08/11/20 1430 08/11/20 1959 08/12/20 0610 08/12/20 1317  BP: 102/63 109/60 100/61 120/69  Pulse: 66 75 63 74  Resp: '20 19 16 18  ' Temp: 97.7 F (36.5  C) 98 F (36.7 C) 97.8 F (36.6 C) 98.2 F (36.8 C)  TempSrc: Oral Oral Oral Oral  SpO2: 100% 98% 100% 100%   General: alert, cooperative, and no distress Lochia: appropriate Uterine Fundus: firm Incision: Healing well with no significant drainage, No significant erythema, Honeycomb dressing is clean, dry, and intact. DVT Evaluation: No evidence of DVT seen on physical exam. No cords or calf tenderness. No significant calf/ankle edema. Labs: Lab Results  Component Value Date   WBC 11.5 (H) 08/11/2020   HGB 11.3 (L)  08/11/2020   HCT 32.4 (L) 08/11/2020   MCV 87.1 08/11/2020   PLT 177 08/11/2020   CMP Latest Ref Rng & Units 08/12/2020  Glucose 70 - 99 mg/dL 104(H)  BUN 6 - 20 mg/dL 15  Creatinine 0.44 - 1.00 mg/dL 0.77  Sodium 135 - 145 mmol/L 136  Potassium 3.5 - 5.1 mmol/L 3.9  Chloride 98 - 111 mmol/L 108  CO2 22 - 32 mmol/L 22  Calcium 8.9 - 10.3 mg/dL 8.1(L)  Total Protein 6.0 - 8.5 g/dL -  Total Bilirubin 0.0 - 1.2 mg/dL -  Alkaline Phos 44 - 121 IU/L -  AST 0 - 40 IU/L -  ALT 0 - 32 IU/L -   Edinburgh Score: Edinburgh Postnatal Depression Scale Screening Tool 08/11/2020  I have been able to laugh and see the funny side of things. 0  I have looked forward with enjoyment to things. 0  I have blamed myself unnecessarily when things went wrong. 0  I have been anxious or worried for no good reason. 1  I have felt scared or panicky for no good reason. 0  Things have been getting on top of me. 1  I have been so unhappy that I have had difficulty sleeping. 1  I have felt sad or miserable. 0  I have been so unhappy that I have been crying. 0  The thought of harming myself has occurred to me. 0  Edinburgh Postnatal Depression Scale Total 3     After visit meds:  Allergies as of 08/13/2020   No Known Allergies      Medication List     STOP taking these medications    Accu-Chek Nano SmartView w/Device Kit   Accu-Chek SmartView test strip Generic drug: glucose blood   Accu-Chek Softclix Lancets lancets   aspirin EC 81 MG tablet   Doxylamine-Pyridoxine 10-10 MG Tbec Commonly known as: Diclegis   hydrocortisone 25 MG suppository Commonly known as: ANUSOL-HC   hydrocortisone cream 1 %       TAKE these medications    acetaminophen 500 MG tablet Commonly known as: TYLENOL Take 500 mg by mouth every 6 (six) hours as needed.   coconut oil Oil Apply 1 application topically as needed (nipple pain).   ibuprofen 600 MG tablet Commonly known as: ADVIL Take 1 tablet (600 mg  total) by mouth every 6 (six) hours.   oxyCODONE 5 MG immediate release tablet Commonly known as: Oxy IR/ROXICODONE Take 1-2 tablets (5-10 mg total) by mouth every 6 (six) hours as needed for severe pain or breakthrough pain.   prenatal vitamin w/FE, FA 27-1 MG Tabs tablet Take 1 tablet by mouth daily at 12 noon.         Discharge home in stable condition Infant Feeding: Bottle and Breast Infant Disposition:home with mother Discharge instruction: per After Visit Summary and Postpartum booklet. Activity: Advance as tolerated. Pelvic rest for 6 weeks.  Diet: routine diet Future Appointments: Future  Appointments  Date Time Provider [place]  08/21/2020  9:00 AM [facility] NURSE [facility]  10/08/2020  2:35 PM Luvenia Redden, PA-C [facility]   Follow up Visit: Message sent to [facility] by [facility]  Please schedule this patient for a In person postpartum visit in 6 weeks with the following provider: Any provider. Additional Postpartum F/U:2 hour GTT and Incision check 1 week  High risk pregnancy complicated by:  DiDi Twins, Malpresentation & IUGR of [name], A1GDM Delivery modeDorthie Santini, Girl Fusako [id number]  C-Section, Low Transverse    Jaslin, Novitski Bradyn [id number]  C-Section, Low Transverse  Anticipated Birth Control:  Unsure   08/13/2020 Myrtis Ser, CNM 6:15 AM

## 2020-08-10 NOTE — Transfer of Care (Signed)
Immediate Anesthesia Transfer of Care Note  Patient: Christy Jackson  Procedure(s) Performed: CESAREAN SECTION MULTI-GESTATIONAL  Patient Location: PACU  Anesthesia Type:Spinal  Level of Consciousness: awake, alert  and oriented  Airway & Oxygen Therapy: Patient Spontanous Breathing  Post-op Assessment: Report given to RN and Post -op Vital signs reviewed and stable  Post vital signs: Reviewed and stable  Last Vitals:  Vitals Value Taken Time  BP 124/80 08/10/20 2351  Temp    Pulse 69 08/10/20 2356  Resp 17 08/10/20 2356  SpO2 100 % 08/10/20 2356  Vitals shown include unvalidated device data.  Last Pain: There were no vitals filed for this visit.       Complications: No notable events documented.

## 2020-08-10 NOTE — Anesthesia Procedure Notes (Signed)
Spinal  Patient location during procedure: OR Start time: 08/10/2020 10:36 PM End time: 08/10/2020 10:40 PM Reason for block: surgical anesthesia Preanesthetic Checklist Completed: patient identified, IV checked, site marked, risks and benefits discussed, surgical consent, monitors and equipment checked, pre-op evaluation and timeout performed Spinal Block Patient position: sitting Prep: DuraPrep Patient monitoring: heart rate, cardiac monitor, continuous pulse ox and blood pressure Approach: midline Location: L3-4 Injection technique: single-shot Needle Needle type: Sprotte  Needle gauge: 24 G Needle length: 9 cm Assessment Sensory level: T4 Events: CSF return

## 2020-08-10 NOTE — Anesthesia Preprocedure Evaluation (Signed)
Anesthesia Evaluation  Patient identified by MRN, date of birth, ID band Patient awake    Reviewed: Allergy & Precautions, H&P , NPO status , Patient's Chart, lab work & pertinent test results, reviewed documented beta blocker date and time   Airway Mallampati: II  TM Distance: >3 FB Neck ROM: full    Dental no notable dental hx. (+) Teeth Intact, Dental Advisory Given   Pulmonary neg pulmonary ROS,    Pulmonary exam normal breath sounds clear to auscultation       Cardiovascular negative cardio ROS Normal cardiovascular exam Rhythm:regular Rate:Normal     Neuro/Psych negative neurological ROS  negative psych ROS   GI/Hepatic negative GI ROS, Neg liver ROS,   Endo/Other  diabetes, Gestational  Renal/GU negative Renal ROS  negative genitourinary   Musculoskeletal   Abdominal   Peds  Hematology negative hematology ROS (+)   Anesthesia Other Findings   Reproductive/Obstetrics (+) Pregnancy                             Anesthesia Physical Anesthesia Plan  ASA: 3 and emergent  Anesthesia Plan: Spinal   Post-op Pain Management:    Induction:   PONV Risk Score and Plan: 2  Airway Management Planned: Natural Airway  Additional Equipment: None  Intra-op Plan:   Post-operative Plan:   Informed Consent: I have reviewed the patients History and Physical, chart, labs and discussed the procedure including the risks, benefits and alternatives for the proposed anesthesia with the patient or authorized representative who has indicated his/her understanding and acceptance.       Plan Discussed with: Anesthesiologist  Anesthesia Plan Comments: (  )        Anesthesia Quick Evaluation

## 2020-08-10 NOTE — Telephone Encounter (Signed)
Called patient in response to her message that she sent in via My Chart. She reports she realized that the bleeding was from her Hemorrhoids not her vagina   She reports she is having mild intermittent contractions.   She is aware that if she starts bleeding like a period or more or starts having pain or regular contractions she is to go to MAU.   She is scheduled for a c/s this week.   She voiced she has no other questions or concerns.

## 2020-08-10 NOTE — Op Note (Addendum)
Operative Note   SURGERY DATE: 08/10/2020  PRE-OP DIAGNOSIS:  * Pregnancy at [redacted]w[redacted]d * Dichorionic-Diamniotic Twins * Fetal growth restriction of [name] * Malpresentation of [name] Homero Fellers Breech) * Preterm premature rupture of membranes * Early labor  POST-OP DIAGNOSIS: Same. Delivered. True knot in [name]'s cord  PROCEDURE: primary low transverse cesarean section via pfannenstiel skin incision with double layer uterine closure  SURGEON: Surgeon(s) and Role:    * [place] Bing, MD - Primary    * Goswick, Skipper Cliche, MD - Assisting (OB Fellow)  ANESTHESIA: spinal  ESTIMATED BLOOD LOSS:  590 ml  DRAINS: 100 mL UOP via indwelling foley  TOTAL IV FLUIDS: 2,300 mL crystalloid  VTE PROPHYLAXIS: SCDs to bilateral lower extremities  ANTIBIOTICS: 2g ancef & 500mg  azithromycin, within 1 hour of skin incision  SPECIMENS: placenta to pathology given Twin Gestation with FGR  COMPLICATIONS: None  FINDINGS: No intra-abdominal adhesions were noted. Grossly normal uterus, tubes and ovaries.  [name]: Clear amniotic fluid, frank breech, viable female infant, weight 2245 gm, APGARs 8/9, intact placenta. True knot in cord [name]: Clear amniotic fluid, cephalic, viable female infant, weight 2160 gm, APGARs 8/9, intact placenta.  PROCEDURE IN DETAIL: The patient was taken to the operating room where anesthesia was administered and normal fetal heart tones were confirmed. She was then prepped and draped in the normal fashion in the dorsal supine position with a leftward tilt.  After a time out was performed, a pfannensteil skin incision was made with the scalpel and carried through to the underlying layer of fascia. The fascia was then incised at the midline and this incision was extended laterally with the mayo scissors. Attention was turned to the superior aspect of the fascial incision which was grasped with the kocher clamps x 2, tented up and the rectus muscles were dissected off with the bovie. In  a similar fashion the inferior aspect of the fascial incision was grasped with the kocher clamps, tented up and the rectus muscles dissected off with the mayo scissors. The rectus muscles were then separated in the midline and the peritoneum was entered bluntly. The bladder blade was inserted and the vesicouterine peritoneum was identified, tented up and entered with the metzenbaum scissors. This incision was extended laterally and the bladder flap was created digitally. The bladder blade was reinserted.  A low transverse hysterotomy was made with the scalpel until the endometrial cavity was breached and the amniotic sac ruptured with the Allis clamp, yielding clear amniotic fluid of [name]. This incision was extended bluntly and [name] was delivered with use of breech maneuvers. The cord of [name] was clamped x 2 and cut, and the infant was handed to the awaiting pediatricians, after delayed cord clamping was done. Amniotic sac of [name] was then ruptured with the Allis clamp, yielding clear amniotic fluid. [name] delivered cephalic. The cord of [name] was clamped x 2 and cut, and the infant was handed to the awaiting pediatricians, after delayed cord clamping was done.  The placenta was then gradually expressed from the uterus and then the uterus was exteriorized and cleared of all clots and debris. The hysterotomy was repaired with a running suture of 1-0 Monocryl. A second imbricating layer of 1-0 Monocryl suture was then placed.   The uterus and adnexa were then returned to the abdomen, and the hysterotomy and all operative sites were reinspected and excellent hemostasis was noted after irrigation and suction of the abdomen with  warm saline.  The peritoneum was closed with a running stitch of 3-0 Vicryl. The fascia was reapproximated with 0 Vicryl in a simple running fashion bilaterally. The subcutaneous layer was then reapproximated with interrupted sutures of 2-0 plain gut, and the skin was then  closed with 4-0 monocryl, in a subcuticular fashion.  The patient  tolerated the procedure well. Sponge, lap, needle, and instrument counts were correct x 2. The patient was transferred to the recovery room awake, alert and breathing independently in stable condition.  [name], MD OB Fellow, Faculty Practice 08/10/2020 11:52 PM   Agree with above. I was present and scrubbed for the entire procedure.   Cornelia Copa MD Attending Center for [employer] [facility])

## 2020-08-10 NOTE — MAU Provider Note (Signed)
OB Note PPROM of [name], still breech. 4cm. Category I with accels for both, toco quiet. D/w pt and recommend dose of terbutaline while getting ready for c-section. Last PO was some onion rings at 1800. Okay to proceed when labs back (d/w anesthesia).  Cornelia Copa MD [facility]ending [facility] [employer] (Faculty Practice) 08/10/2020 Time: 2126

## 2020-08-10 NOTE — H&P (Signed)
OBSTETRIC ADMISSION HISTORY AND PHYSICAL  Darionna Voth is a 35 y.o. female [id number] with IUP at 23w4dby 7 week ultrasound presenting for contractions and PPROM at 1900 today. She reports +FMs, No LOF, no VB, no blurry vision, headaches or peripheral edema, and RUQ pain.  She plans on breast and formula feeding. She is undecided for birth control.  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  it was not serious"    Obstetrical History: OB History     Gravida  5   Para      Term      Preterm      AB  4   Living         SAB  2   IAB      Ectopic  2   Multiple      Live Births              Social History Social History   Socioeconomic History   Marital status: Married    Spouse name: Not on file   Number of children: Not on file   Years of education: Not on file   Highest education level: Not on file  Occupational History   Not on file  Tobacco Use   Smoking status: Never   Smokeless tobacco: Never  Vaping Use   Vaping Use: Never used  Substance and Sexual Activity    Alcohol use: No   Drug use: No   Sexual activity: Not Currently    Birth control/protection: None  Other Topics Concern   Not on file  Social History Narrative   Not on file   Social Determinants of Health   Financial Resource Strain: Not on file  Food Insecurity: No Food Insecurity   Worried About Running Out of Food in the Last Year: Never true   RPonderosain the Last Year: Never true  Transportation Needs: No Transportation Needs   Lack of Transportation (Medical): No   Lack of Transportation (Non-Medical): No  Physical Activity: Not on file  Stress: Not on file  Social Connections: Not on file    Family History: Family History  Problem Relation Age of Onset   Diabetes Mother    Hypertension Mother     Allergies: No Known Allergies  Medications Prior to Admission  Medication Sig Dispense Refill Last Dose   acetaminophen (TYLENOL) 500 MG tablet Take 500 mg by mouth every 6 (six) hours as needed.   Past Week   aspirin EC 81 MG tablet Take 1 tablet (81 mg total) by mouth daily. Take after  12 weeks for prevention of preeclampsia later in pregnancy (Patient taking differently: Take 81 mg by mouth daily at 12 noon. Take after 12 weeks for prevention of preeclampsia later in pregnancy) 300 tablet 2 08/09/2020   prenatal vitamin w/FE, FA (PRENATAL 1 + 1) 27-1 MG TABS tablet Take 1 tablet by mouth daily at 12 noon. 30 tablet 11 Past Week   Accu-Chek Softclix Lancets lancets 120 each by Other route 4 (four) times daily. 100 each 12    Blood Glucose Monitoring Suppl (ACCU-CHEK NANO SMARTVIEW) w/Device KIT 1 kit by Subdermal route as directed. Check blood sugars for fasting, and two hours after breakfast, lunch and dinner (4 checks daily) 1 kit 0    Doxylamine-Pyridoxine (DICLEGIS) 10-10 MG TBEC Take 2 tablets by mouth at bedtime. If symptoms persist, add one tablet in the morning and one in the afternoon (Patient not taking: No sig reported) 100 tablet 5    glucose blood  (ACCU-CHEK SMARTVIEW) test strip Use as instructed to check blood sugars 100 each 12    hydrocortisone (ANUSOL-HC) 25 MG suppository Place 1 suppository (25 mg total) rectally 2 (two) times daily. (Patient not taking: No sig reported) 12 suppository 0    hydrocortisone cream 1 % Apply 1 application topically 2 (two) times daily as needed for itching (hemmorroids.).        Review of Systems   All systems reviewed and negative except as stated in HPI  Last menstrual period 11/25/2019, unknown if currently breastfeeding. General appearance: alert, cooperative, and appears stated age Lungs: clear to auscultation bilaterally Heart: regular rate and rhythm Abdomen: soft, non-tender Extremities: no sign of DVT Presentation: [name] frank breech, [name] breech Fetal monitoring: [name]: baseline 140, moderate variability, +accels, no decels [name]: baseline 120, moderate variability, +accels, no decels Uterine activity: pt reports 6/10 every 2-3 minutes   Prenatal labs: ABO, Rh: O/Positive/-- (12/30 1443) Antibody: Negative (12/30 1443) Rubella: 11.70 (12/30 1443) RPR: Non Reactive (04/21 0836)  HBsAg: Negative (12/30 1443)  HIV: Non Reactive (04/21 0836)  GBS: Negative/-- (06/23 1101)  2 hr Glucola 97/178/137 Genetic screening  wnl Anatomy US wnl  Prenatal Transfer Tool  Maternal Diabetes: Yes:  Diabetes Type:  Diet controlled Genetic Screening: Abnormal:  Results: Other:carrier for Isovaleric Acidemia Maternal Ultrasounds/Referrals: IUGR of [name] Fetal Ultrasounds or other Referrals:  Fetal echo, Referred to Materal Fetal Medicine  Maternal Substance Abuse:  No Significant Maternal Medications:  None Significant Maternal Lab Results: Group B Strep negative  Results for orders placed or performed during the hospital encounter of 08/10/20 (from the past 24 hour(s))  POCT fern test   Collection Time: 08/10/20  8:33 PM  Result Value Ref Range   POCT Fern Test Positive = ruptured  amniotic membanes     Patient Active Problem List   Diagnosis Date Noted   IUGR (intrauterine growth restriction) affecting care of mother 08/06/2020   Gestational diabetes mellitus (GDM), antepartum 06/08/2020   Inheritable disease possibly affecting pregnancy 03/18/2020   In vitro fertilization 03/13/2020   Supervision of high risk pregnancy, antepartum 02/13/2020   Family history of Down syndrome 02/13/2020   AMA (advanced maternal age) multigravida 35+, unspecified trimester 02/13/2020   COVID-19 affecting pregnancy in first trimester 02/13/2020   Twin gestation, dichorionic diamniotic 01/22/2020    Assessment/Plan:  Avenly Lablanc is a 35 y.o. [id number] at 42w4dhere for contractions and PPROM at 145today in the setting of didi twins with malpresentation of [name]. Will  admit for primary Cesarean as noted below.  #Primary Cesarean  Didi Twins  Malpresentation of [name]:  The risks of cesarean section were discussed with the patient including but were not limited to: bleeding which may require transfusion or reoperation; infection which may require antibiotics; injury to bowel, bladder, ureters or other surrounding organs; injury to the fetus; need for additional procedures including hysterectomy in the event of a life-threatening hemorrhage; placental abnormalities wth subsequent pregnancies, incisional problems, thromboembolic phenomenon and other postoperative/anesthesia complications. The patient concurred with the proposed plan, giving informed written consent for the procedure. Patient has been NPO since 1800; she will remain NPO for procedure. Dr. Ilda Basset notified. Anesthesia and OR aware.  Preoperative prophylactic antibiotics and SCDs ordered on call to the OR.  To OR when ready.  #Pain: per anesthesia #FWB: Category 1 strip x2 #ID: GBS negative; plan for ancef & azithromycin prior to OR #MOF: breast & formula #MOC: undecided; pt desires further conversation s/p delivery (of  note, IVF utilized for current pregnancy) #Circ:  N/a #A1GDM: plan for POD#1 fasting BG check. Fetal echo wnl x2.  Randa Ngo, MD OB Fellow, Faculty Practice 08/10/2020 9:18 PM

## 2020-08-11 ENCOUNTER — Ambulatory Visit: Payer: Medicaid Other

## 2020-08-11 ENCOUNTER — Encounter (HOSPITAL_COMMUNITY): Payer: Self-pay | Admitting: Obstetrics & Gynecology

## 2020-08-11 ENCOUNTER — Other Ambulatory Visit (HOSPITAL_COMMUNITY): Payer: Medicaid Other

## 2020-08-11 ENCOUNTER — Encounter (HOSPITAL_COMMUNITY)
Admission: RE | Admit: 2020-08-11 | Discharge: 2020-08-11 | Disposition: A | Discharge status: Home / Self Care | Payer: Medicaid Other | Source: Ambulatory Visit | Attending: Obstetrics & Gynecology | Admitting: Obstetrics & Gynecology

## 2020-08-11 ENCOUNTER — Other Ambulatory Visit: Payer: Medicaid Other

## 2020-08-11 HISTORY — DX: Gestational diabetes mellitus in pregnancy, unspecified control: O24.419

## 2020-08-11 LAB — BASIC METABOLIC PANEL
Anion gap: 5 (ref 5–15)
BUN: 15 mg/dL (ref 6–20)
CO2: 22 mmol/L (ref 22–32)
Calcium: 8.2 mg/dL — ABNORMAL LOW (ref 8.9–10.3)
Chloride: 107 mmol/L (ref 98–111)
Creatinine, Ser: 0.83 mg/dL (ref 0.44–1.00)
GFR, Estimated: >60 mL/min (ref >60)
Glucose, Bld: 95 mg/dL (ref 70–99)
Potassium: 4 mmol/L (ref 3.5–5.1)
Sodium: 134 mmol/L — ABNORMAL LOW (ref 135–145)

## 2020-08-11 LAB — CBC
HCT: 32.4 % — ABNORMAL LOW (ref 36.0–46.0)
Hemoglobin: 11.3 g/dL — ABNORMAL LOW (ref 12.0–15.0)
MCH: 30.4 pg (ref 26.0–34.0)
MCHC: 34.9 g/dL (ref 30.0–36.0)
MCV: 87.1 fL (ref 80.0–100.0)
Platelets: 177 10*3/uL (ref 150–400)
RBC: 3.72 MIL/uL — ABNORMAL LOW (ref 3.87–5.11)
RDW: 13 % (ref 11.5–15.5)
WBC: 11.5 10*3/uL — ABNORMAL HIGH (ref 4.0–10.5)
nRBC: 0 % (ref 0.0–0.2)

## 2020-08-11 LAB — GLUCOSE, CAPILLARY
Glucose-Capillary: 127 mg/dL — ABNORMAL HIGH (ref 70–99)
Glucose-Capillary: 86 mg/dL (ref 70–99)

## 2020-08-11 MED ORDER — LACTATED RINGERS IV SOLN: Freq: Once | INTRAVENOUS | Status: AC

## 2020-08-11 MED ORDER — LACTATED RINGERS IV SOLN: INTRAVENOUS | Status: AC

## 2020-08-11 MED ORDER — SODIUM CHLORIDE 0.9 % IV SOLN
INTRAVENOUS | Status: DC | PRN
  Administered 2020-08-10: 500 mg via INTRAVENOUS

## 2020-08-11 MED ORDER — KETOROLAC TROMETHAMINE 30 MG/ML IJ SOLN
INTRAMUSCULAR | Status: AC
  Filled 2020-08-11: qty 1

## 2020-08-11 NOTE — Anesthesia Postprocedure Evaluation (Signed)
Anesthesia Post Note  Patient: Christy Jackson  Procedure(s) Performed: CESAREAN SECTION MULTI-GESTATIONAL     Patient location during evaluation: PACU Anesthesia Type: Spinal Level of consciousness: oriented and awake and alert Pain management: pain level controlled Vital Signs Assessment: post-procedure vital signs reviewed and stable Respiratory status: spontaneous breathing, respiratory function stable and patient connected to nasal cannula oxygen Cardiovascular status: blood pressure returned to baseline and stable Postop Assessment: no headache, no backache and no apparent nausea or vomiting Anesthetic complications: no   No notable events documented.  Last Vitals:  Vitals:   08/11/20 1430 08/11/20 1959  BP: 102/63 109/60  Pulse: 66 75  Resp: 20 19  Temp: 36.5 C 36.7 C  SpO2: 100% 98%    Last Pain:  Vitals:   08/11/20 2048  TempSrc:   PainSc: 4                  Trestin Vences

## 2020-08-11 NOTE — Progress Notes (Signed)
RN emptied Foley output of 75 mL over last 6 hours. The urine output was dark amber in color. Foley catheter assessed and found to be unclamped, below level of bladder and with no dependent loops. Mom is drinking an adequate amount of fluids and is getting LR at a continuous rate. Dr. Germaine Pomfret notified of inadequate urinary output and requested that patient be bladder scanned and that BMP lab work be completed. Amount of retained urine in the bladder was only an average of 22 mL.BP within normal limits (109/60) and edema mild and non-pitting in bilateral lower extremities.This RN will update OB when lab work has resulted upon request.

## 2020-08-11 NOTE — Lactation Note (Signed)
This note was copied from a baby's chart. Lactation Consultation Note  Patient Name: Christy Jackson Today's Date: 08/11/2020 Reason for consult: Initial assessment;Early term 37-38.6wks;Primapara;1st time breastfeeding Age:35 hours   Initial LC Consult:  Parents awake when I arrived.  Mother reported that she just spoon fed 5 mls of EBM to "[name]" and "[name]" was able to latch and feed. Both babies were swaddled and asleep in their bassinets.  Mother desires to sleep now.  Asked her to call back when she awakens for the next feeding so I can review LPTI policy, assist with latching and set up the DEBP as desired.  Mother verbalized understanding.   Maternal Data    Feeding Mother's Current Feeding Choice: Breast Milk  LATCH Score Latch: Repeated attempts needed to sustain latch, nipple held in mouth throughout feeding, stimulation needed to elicit sucking reflex.  Audible Swallowing: A few with stimulation  Type of Nipple: Everted at rest and after stimulation  Comfort (Breast/Nipple): Soft / non-tender  Hold (Positioning): Assistance needed to correctly position infant at breast and maintain latch.  LATCH Score: 7   Lactation Tools Discussed/Used    Interventions    Discharge    Consult Status Consult Status: Follow-up Date: 08/11/20 Follow-up type: In-patient    Christy Jackson 08/11/2020, 4:11 AM

## 2020-08-11 NOTE — Lactation Note (Signed)
This note was copied from a baby's chart. Lactation Consultation Note  Patient Name: Girl Jenine Spranger Today's Date: 08/11/2020 Reason for consult: Initial assessment;Late-preterm 34-36.6wks;Primapara;1st time breastfeeding;Infant < 6lbs Age:35 hours   P2 mother whose infant twin girls are now 46 hours old.  These are later preterm infants at 36+4 weeks with a CGA of 36+5 weeks.  Mother's feeding plan is to exclusively breast feed.  Mother had just finished feeding the girls at 0700 and 64 when I arrived.  "[name]" awake still and showing feeding cues.  Attempted to latch, however, once at the breast she was not interested in feeding.  Taught mother the cross cradle hold instead of her "preferred" cradle hold.  Educated on the importance of obtaining a good latch and proper positioning.  Provided colostrum drops.   "[name]" was asleep; no further intervention at this time.  Reviewed the LPTI policy in depth.  Mother had many questions which I answered to her satisfaction.  Discussed basic breast feeding concepts.  Discussed supplementation beginning with the next feeding.  Options of formula vs donor breast milk presented.  Mother is only interested in using formula as supplementation.  RN informed.  Set up the DEBP and observed mother pumping.  #24 flange size is appropriate at this time.  Discussed set up, assembly and cleaning.  Mother did a return demonstration of pump set up.  Provided coconut oil to use for comfort after expressing colostrum for nipples/areolas.  Mother was able to pump 2 mls of EBM which she will divide between the girls at the next feeding.  Mom made aware of O/P services, breastfeeding support groups, community resources, and our phone # for post-discharge questions.  Mother has a DEBP for home use.  Asked her to contact her RN/LC for any questions; lots of information was presented and will need to be reinforced.  Father was not present during the consult but will return  this a.m.     Maternal Data Has patient been taught Hand Expression?: Yes Does the patient have breastfeeding experience prior to this delivery?: No  Feeding Mother's Current Feeding Choice: Breast Milk  LATCH Score Latch: Too sleepy or reluctant, no latch achieved, no sucking elicited.  Audible Swallowing: None  Type of Nipple: Everted at rest and after stimulation  Comfort (Breast/Nipple): Soft / non-tender  Hold (Positioning): Assistance needed to correctly position infant at breast and maintain latch.  LATCH Score: 5   Lactation Tools Discussed/Used Tools: Pump Breast pump type: Double-Electric Breast Pump;Manual Pump Education: Setup, frequency, and cleaning;Milk Storage Reason for Pumping: Stimulation and supplementation for LPTI twins Pumping frequency: Every three hours Pumped volume: 2 mL  Interventions Interventions: Breast feeding basics reviewed;Assisted with latch;Skin to skin;Breast massage;Hand express;Breast compression;Adjust position;DEBP;Hand pump;Coconut oil;Expressed milk;Position options;Support pillows;Education  Discharge Pump: Personal  Consult Status Consult Status: Follow-up Date: 08/12/20 Follow-up type: In-patient    Dora Sims 08/11/2020, 8:39 AM

## 2020-08-11 NOTE — Progress Notes (Addendum)
POSTPARTUM PROGRESS NOTE  Post Op Day 1  Subjective:  Christy Jackson is a 35 y.o. [id number] s/p pLTCS at [redacted]w[redacted]d.  No acute events overnight.  Pt denies problems with PO intake. Has not gotten up to ambulate yet and has not yet tried to void. She had one episode of vomiting last night. She denies nausea or vomiting since then.  Pain is well controlled.  She has not had flatus. She has not had bowel movement.  Lochia Minimal.   Objective: Blood pressure 112/65, pulse 68, temperature 98 F (36.7 C), temperature source Oral, resp. rate 18, last menstrual period 11/25/2019, SpO2 98 %, unknown if currently breastfeeding.  Physical Exam:  General: alert, cooperative and no distress Chest: no respiratory distress Heart:regular rate, distal pulses intact Abdomen: soft, nontender,  Uterine Fundus: firm, appropriately tender. DVT Evaluation: No calf swelling or tenderness Extremities: no edema Skin: warm, dry; honeycomb clean/dry/intact  Recent Labs    08/10/20 2123 08/11/20 0500  HGB 12.9 11.3*  HCT 38.3 32.4*    Assessment/Plan: Christy Jackson is a 35 y.o. [id number] s/p pLTCS at [redacted]w[redacted]d secondary to malpresentation of [name] in the setting of preterm labor s/p PPROM.  POD#1 - Doing well Contraception: undecided, prefers non-hormonal methods. Counseling provided. Patient stated she may not want to wait 1 year to try for another pregnancy given history of infertility. Discussed with patient that short interval pregnancy is higher risk. Feeding: breast and bottle Dispo: Plan for discharge POD#2-3.   LOS: 1 day   Littie Deeds, MD 08/11/2020, 8:10 AM    Attestation of Supervision of Resident:  I confirm that I have verified the information documented in the  resident's  note and that I have also personally reperformed the history, physical exam and all medical decision making activities.  I have verified that all services and findings are accurately documented in this student's note; and I agree  with management and plan as outlined in the documentation. I have also made any necessary editorial changes.  [name], MD Center for [facility], [facility] 08/11/2020 8:38 AM

## 2020-08-12 LAB — GLUCOSE, CAPILLARY: Glucose-Capillary: 100 mg/dL — ABNORMAL HIGH (ref 70–99)

## 2020-08-12 LAB — BASIC METABOLIC PANEL
Anion gap: 6 (ref 5–15)
BUN: 15 mg/dL (ref 6–20)
CO2: 22 mmol/L (ref 22–32)
Calcium: 8.1 mg/dL — ABNORMAL LOW (ref 8.9–10.3)
Chloride: 108 mmol/L (ref 98–111)
Creatinine, Ser: 0.77 mg/dL (ref 0.44–1.00)
GFR, Estimated: >60 mL/min (ref >60)
Glucose, Bld: 104 mg/dL — ABNORMAL HIGH (ref 70–99)
Potassium: 3.9 mmol/L (ref 3.5–5.1)
Sodium: 136 mmol/L (ref 135–145)

## 2020-08-12 MED ORDER — LACTATED RINGERS IV BOLUS: 1000.0000 mL | Freq: Once | INTRAVENOUS | Status: DC

## 2020-08-12 MED ORDER — LACTATED RINGERS IV BOLUS: 500.0000 mL | Freq: Once | INTRAVENOUS | Status: DC

## 2020-08-12 NOTE — Lactation Note (Signed)
This note was copied from a baby's chart. Lactation Consultation Note  Patient Name: Christy Jackson RXYVO'P Date: 08/12/2020 Reason for consult: Follow-up assessment;Late-preterm 34-36.6wks;Primapara;1st time breastfeeding;Infant < 6lbs Age:35 hours   P2 mother whose infant twin girls are now 47 hours old.  These are later preterm infants at 36+4 weeks with a CGA of 36+6 weeks weighing < 5 lbs.  Mother's current feeding plan is to breast/formula feed.  Mother has not been too interested in working with latching at this time.  She is learning how to manage the twins and care for their basic needs.  She feels like she does not have enough time for sleep.  Mother has been primarily bottle feeding Similac 22 calorie formula.  We reviewed the LPTI policy yesterday and I reviewed again today.  With the exception of one time when mother was exhausted, she has been feeding every thee hours.  Volumes have been good.  Mother had already fed "[name]" prior to my arrival.  She did not attempt breast feeding at that time.  Baby consumed 30 mls of formula using the purple extra slow flow nipple.  "[name]" was asleep in her arms and had not consumed her required volume.  Offered to demonstrate how to awaken baby and feed more.  Mother agreeable.  Removed swaddle, gently stimulated and showed mother how to be more assertive with getting baby to initiate a suck.  Mother observed baby awakening and consuming for formula; a total of 35 mls.  Demonstrated effective burping.    Mother had last pumped 10 mls of EBM and divided the amount between both girls as discussed yesterday.  Praised mother for her efforts.  She verbalized needing to figure out how to handle everything "on her own."  Discussed how father can help and planned the feedings for today. (Father not present initially but returned near the end of my visit).  Spoke with father about the role he can take to help mother with baby care and supplementing.   Suggested mother try to latch every other feeding today as desired.  Offered to assist.  Mother will continue to pump every three hours.  Provided emotional support and encouraged mother.  Discussed realistic feeding goals.  Reminded mother to ask father for assistance, to get adequate hydration and nutrition and to get some sleep in between feedings.  Allowed mother time for verbalization of feelings.  MD in room at the end of my visit.  Mother is a [facility] participant in [place].  Referral faxed.   Maternal Data Has patient been taught Hand Expression?: Yes Does the patient have breastfeeding experience prior to this delivery?: No  Feeding Mother's Current Feeding Choice: Breast Milk and Formula Nipple Type: Extra Slow Flow  LATCH Score                    Lactation Tools Discussed/Used Tools: Pump;Flanges Flange Size: 24;27 Breast pump type: Double-Electric Breast Pump;Manual Pump Education: Setup, frequency, and cleaning (No review needed) Reason for Pumping: LPTI twins, breast stimulation and supplementation Pumping frequency: Every three hours Pumped volume: 10 mL  Interventions    Discharge Pump: DEBP;Manual;Personal (Mother has a DEBP for home use; plans to obtain a hospital grade DEBP from [facility]) WIC Program: Yes  Consult Status Consult Status: Follow-up Date: 08/13/20 Follow-up type: In-patient    Christy Jackson R Christy Jackson 08/12/2020, 10:19 AM

## 2020-08-12 NOTE — Progress Notes (Signed)
POSTPARTUM PROGRESS NOTE  Subjective: Christy Jackson is a 35 y.o. [id number] s/p pLTCS at [redacted]w[redacted]d.  She reports she doing well. No acute events overnight. She denies any problems with ambulating, voiding or po intake. Of note, foley catheter was maintained yesterday given low UOP. However, pt now with successful void x2 s/p removal of foley last night. Denies nausea or vomiting. She has passed flatus. Pain is moderately controlled.  Lochia is minimal.  Objective: Blood pressure 109/60, pulse 75, temperature 98 F (36.7 C), temperature source Oral, resp. rate 19, last menstrual period 11/25/2019, SpO2 98 %, unknown if currently breastfeeding.  Physical Exam:  General: alert, cooperative and no distress Chest: no respiratory distress Abdomen: soft, non-tender. Honeycomb dressing with minimal areas of dried blood; otherwise c/d/i. Uterine Fundus: firm and at level of umbilicus Extremities: No calf swelling or tenderness  no LE edema  Recent Labs    08/10/20 2123 08/11/20 0500  HGB 12.9 11.3*  HCT 38.3 32.4*    Assessment/Plan: Christy Jackson is a 35 y.o. [id number] s/p pLTCS at [redacted]w[redacted]d for fetal malpresentation in the s/o preterm labor s/p PPROM for didi twins.  Routine Postpartum Care: Doing well, pain well-controlled.  -- Continue routine care, lactation support  -- Contraception: undecided; counseled pt today [id number] -- Feeding: breast & formula -- Elevated Cr  Low UOP: foley catheter maintained yesterday given concern of low UOP. Reassuring pt has now had 2 successful voids s/p removal of foley. Cr elevated to 0.83 (BL 0.4-0.5). Will f/u repeat BMP this AM and continue to monitor UOP. -- A1GDM: f/u fasting BG level this morning given not a true fasting level yesterday.  Dispo: Plan for discharge POD#3 given babies needing to stay.  [name], MD OB Fellow, Faculty Practice 08/12/2020 6:12 AM

## 2020-08-12 NOTE — Progress Notes (Signed)
This RN disconnected continuous fluid, leaving IV saline locked and removed foley catheter at 0000. Up to bathroom at 0330; pt able to void 600 mL without difficulty. Dr. Barb Merino notified and is OK not to give fluid bolus given recent output.    Elvia Collum, RN

## 2020-08-12 NOTE — Lactation Note (Signed)
This note was copied from a baby's chart. Lactation Consultation Note  Patient Name: Christy Jackson Today's Date: 08/12/2020 Reason for consult: Follow-up assessment;Infant < 6lbs;Primapara;1st time breastfeeding;Late-preterm 34-36.6wks Age:35 hours  Maternal Data Has patient been taught Hand Expression?: Yes Does the patient have breastfeeding experience prior to this delivery?: No  Feeding Mother's Current Feeding Choice: Breast Milk and Formula Nipple Type: Extra Slow Flow  LATCH Score                    Lactation Tools Discussed/Used Breast pump type: Double-Electric Breast Pump;Manual Pump Education: Setup, frequency, and cleaning (No review needed) Reason for Pumping: NICU infants, breast stimulation and supplementation for twins Pumping frequency: Every three hours  Interventions    Discharge Pump: DEBP;Manual;Personal (Mother has a DEBP for home use; will obtain a hospital grade DEBP from [facility]) WIC Program: Yes  Consult Status Consult Status: Follow-up Date: 08/13/20 Follow-up type: In-patient    Jef Futch R Nandi Tonnesen 08/12/2020, 10:13 AM

## 2020-08-13 ENCOUNTER — Inpatient Hospital Stay (HOSPITAL_COMMUNITY)
Admission: RE | Admit: 2020-08-13 | Payer: Medicaid Other | Source: Home / Self Care | Admitting: Obstetrics & Gynecology

## 2020-08-13 LAB — SURGICAL PATHOLOGY

## 2020-08-13 MED ORDER — IBUPROFEN 600 MG PO TABS: 600.0000 mg | ORAL_TABLET | Freq: Four times a day (QID) | ORAL | 0 refills | Status: AC

## 2020-08-13 MED ORDER — OXYCODONE HCL 5 MG PO TABS: 5.0000 mg | ORAL_TABLET | Freq: Four times a day (QID) | ORAL | 0 refills | Status: DC | PRN

## 2020-08-13 MED ORDER — COCONUT OIL OIL: 1.0000 "application " | TOPICAL_OIL | 0 refills | Status: DC | PRN

## 2020-08-14 ENCOUNTER — Ambulatory Visit: Payer: Self-pay

## 2020-08-14 NOTE — Lactation Note (Signed)
This note was copied from a baby's chart. Lactation Consultation Note  Patient Name: Christy Jackson Today's Date: 08/14/2020 Reason for consult: Follow-up assessment Age:35 days Mother to be discharged. Mother was given a harmony hand pump to pump her breast, her husband had taken every thing to the car, Mother pumped 60 ml Mother concerned that Baby A has not breastfed yet.  She was offered a #24 nipple shield.  Infant latched onto the shield with on and off suckled for 5-10 mins.  There was no transfer of milk. Mother was given instructions in use of the shield and informed to continue to pump every 3 hours for 15 mins on each breast.  Mother to continue to breast feed Baby B and to try and feed Baby A . With ot without the shield.  Mother has an appt with WIC she hopes to get a pump. Mother was scheduled to get a call from OP dept for a follow up visit.  Discussed engorgement and treatment.   Breastfeed infant with feeding cues Supplement infant with ebm/formula, according to supplemental guidelines. Pump using a DEBP after each feeding for 15-20 mins.   Mother to continue to cue base feed infant and feed at least 8-12 times or more in 24 hours and advised to allow for cluster feeding infant as needed.  Mother to continue to due STS. Mother is aware of available LC services at [facility], BFSG'S, OP Dept, and phone # for questions or concerns about breastfeeding.  Mother receptive to all teaching and plan of care.    Maternal Data    Feeding Mother's Current Feeding Choice: Breast Milk and Formula  LATCH Score                    Lactation Tools Discussed/Used Tools: Nipple Dorris Carnes;Flanges Nipple shield size: 24 Flange Size: 27 Breast pump type: Manual Pump Education: Setup, frequency, and cleaning;Milk Storage Reason for Pumping: pump until she get pump from [facility] Pumping frequency: q 3 hours Pumped volume: 60 mL  Interventions Interventions: Adjust  position  Discharge Discharge Education: Engorgement and breast care;Warning signs for feeding baby;Outpatient recommendation;Outpatient Epic message sent Pump: Manual (Harmony hand pump)  Consult Status Consult Status: Complete    Michel Bickers 08/14/2020, 2:38 PM

## 2020-08-20 ENCOUNTER — Encounter (HOSPITAL_COMMUNITY): Payer: Self-pay

## 2020-08-20 ENCOUNTER — Telehealth (HOSPITAL_COMMUNITY): Payer: Self-pay | Admitting: *Deleted

## 2020-08-20 ENCOUNTER — Other Ambulatory Visit: Payer: Self-pay

## 2020-08-20 ENCOUNTER — Inpatient Hospital Stay (HOSPITAL_COMMUNITY): Admit: 2020-08-20 | Payer: Medicaid Other | Admitting: Obstetrics & Gynecology

## 2020-08-20 ENCOUNTER — Telehealth: Payer: Self-pay | Admitting: *Deleted

## 2020-08-20 ENCOUNTER — Ambulatory Visit (INDEPENDENT_AMBULATORY_CARE_PROVIDER_SITE_OTHER): Payer: Medicaid Other | Admitting: *Deleted

## 2020-08-20 DIAGNOSIS — Z5189 Encounter for other specified aftercare: Secondary | ICD-10-CM

## 2020-08-20 DIAGNOSIS — R519 Headache, unspecified: Secondary | ICD-10-CM

## 2020-08-20 DIAGNOSIS — Z8632 Personal history of gestational diabetes: Secondary | ICD-10-CM

## 2020-08-20 LAB — CBC
Hematocrit: 33 % — ABNORMAL LOW (ref 34.0–46.6)
Hemoglobin: 11.1 g/dL (ref 11.1–15.9)
MCH: 29.4 pg (ref 26.6–33.0)
MCHC: 33.6 g/dL (ref 31.5–35.7)
MCV: 87 fL (ref 79–97)
Platelets: 332 10*3/uL (ref 150–450)
RBC: 3.78 x10E6/uL (ref 3.77–5.28)
RDW: 13.9 % (ref 11.7–15.4)
WBC: 6.4 10*3/uL (ref 3.4–10.8)

## 2020-08-20 LAB — COMPREHENSIVE METABOLIC PANEL
ALT: 31 IU/L (ref 0–32)
AST: 35 IU/L (ref 0–40)
Albumin/Globulin Ratio: 1.7 (ref 1.2–2.2)
Albumin: 3.7 g/dL — ABNORMAL LOW (ref 3.8–4.8)
Alkaline Phosphatase: 149 IU/L — ABNORMAL HIGH (ref 44–121)
BUN/Creatinine Ratio: 13 (ref 9–23)
BUN: 7 mg/dL (ref 6–20)
Bilirubin Total: 0.2 mg/dL (ref 0.0–1.2)
CO2: 24 mmol/L (ref 20–29)
Calcium: 9.5 mg/dL (ref 8.7–10.2)
Chloride: 107 mmol/L — ABNORMAL HIGH (ref 96–106)
Creatinine, Ser: 0.54 mg/dL — ABNORMAL LOW (ref 0.57–1.00)
Globulin, Total: 2.2 g/dL (ref 1.5–4.5)
Glucose: 98 mg/dL (ref 65–99)
Potassium: 4.3 mmol/L (ref 3.5–5.2)
Sodium: 140 mmol/L (ref 134–144)
Total Protein: 5.9 g/dL — ABNORMAL LOW (ref 6.0–8.5)
eGFR: 123 mL/min/{1.73_m2} (ref >59)

## 2020-08-20 SURGERY — Surgical Case
Anesthesia: Regional

## 2020-08-20 MED ORDER — CYCLOBENZAPRINE HCL 10 MG PO TABS: 10.0000 mg | ORAL_TABLET | Freq: Three times a day (TID) | ORAL | 0 refills | Status: DC | PRN

## 2020-08-20 NOTE — Telephone Encounter (Signed)
Mom reports she's feeling fine, but tearful at times possibly from little sleep with twins. EPDS =3. Incision healing well. BP is up per patient, with headaches and swollen feet. Had an appt with OB this morning. Waiting on bloodwork results now. Mom reports twins both doing well. Breast and formula feeding. Having a problem with latching at times. Mom given number to call Lactation for appointment. No other concerns. Sylvan Cheese 08/20/2020 1:50pm

## 2020-08-20 NOTE — Progress Notes (Signed)
Patient was not on lab schedule and when lab was notified that she was a 2hr pp patient was called back by lab she informed lab that she was unaware of this testing and had just ate in the car

## 2020-08-20 NOTE — Progress Notes (Signed)
Here for nurse visit for incision check k , 2 hour postpartum glucose, and ip check. S/P c/s 08/10/20 for Di/ Judi Cong twins with malpositioned [name].  D/c on 08/13/20. .  2 hour glucose cancelled for today due to patient had eaten. Will have registrar reschedule at check out.  Bp today 137/77. Still has 1+ edema in feet/ ankles. Denies visual disturbances . C/o some abd or epigastic pain. Taking tyleonol and iibuprofen without much relief. States not gotten more than 2 hours sleep since went home . States thinks she has a fever / chills sometimes.  Temperature in office 98.8. Denies any breast issues. Checked breasts - no redness or hard areas noticed.   Incision clean , dry, intact . Steristrips removed Advised patient wound care instruction and to contact us if any issues.  Discussed with Donia Ast, NP and will draw stat cbc , cmet and call patient with results. Also ordered flexeril prn. Also reviewed precclampsia precautions with patient. She voices understanding. Worley Radermacher,RN

## 2020-08-20 NOTE — Telephone Encounter (Signed)
Stat CBC, CMET received. Reviewed with Dr. Crissie Reese. No new orders. I called Tyquisha and left a message I was calling about her labs which look good. Advised her if preeclampsia symtoms such as severe headache, sudden large edema, BP 160/100 to go to hospital for evaluation . Call us if questions. Mariama Saintvil,RN

## 2020-08-21 ENCOUNTER — Ambulatory Visit: Payer: Medicaid Other

## 2020-08-23 NOTE — Progress Notes (Signed)
Patient was assessed and managed by nursing staff during this encounter. I have reviewed the chart and agree with the documentation and plan. I have also made any necessary editorial changes. Dr. Crissie Reese was consulted for management of this patient.  Marylen Ponto, NP 08/23/2020 8:24 PM

## 2020-08-24 ENCOUNTER — Other Ambulatory Visit: Payer: Medicaid Other

## 2020-09-22 ENCOUNTER — Ambulatory Visit (INDEPENDENT_AMBULATORY_CARE_PROVIDER_SITE_OTHER): Payer: Medicaid Other

## 2020-09-22 ENCOUNTER — Other Ambulatory Visit: Payer: Self-pay

## 2020-09-22 NOTE — Progress Notes (Signed)
    Post Partum Visit Note  Christy Jackson is a 35 y.o. [id number] female who presents for a postpartum visit. She is 6 weeks postpartum following a primary cesarean section.  I have fully reviewed the prenatal and intrapartum course. The delivery was at 36.4 gestational weeks.  Anesthesia: spinal. Postpartum course has been uncomplicated. Patient feeling tired and fatigued. Babies are doing well. Baby is feeding by both breast and bottle - Enfamil Neuropro . Bleeding moderate lochia. Bowel function is normal. Bladder function is normal. Patient is not sexually active. Contraception method is none. Postpartum depression screening: negative.  The pregnancy intention screening data noted above was reviewed. Potential methods of contraception were discussed. The patient elected to proceed with No data recorded.   Health Maintenance Due  Topic Date Due   COVID-19 Vaccine (1) Never done   URINE MICROALBUMIN  Never done   INFLUENZA VACCINE  09/14/2020    The following portions of the patient's history were reviewed and updated as appropriate: allergies, current medications, past family history, past medical history, past social history, past surgical history, and problem list.  Review of Systems Pertinent items are noted in HPI.  Objective:  BP 110/79   Pulse 75   Wt 202 lb 3.2 oz (91.7 kg)   LMP 11/25/2019   Breastfeeding Yes   BMI 33.65 kg/m    General:  alert, cooperative, and no distress   Breasts:  normal  Lungs: Effort normal  Heart:  regular rate and rhythm  Abdomen: soft, non-tender; bowel sounds normal; no masses,  no organomegaly   Wound well approximated incision  GU exam:  normal       Assessment:    Normal postpartum exam.   Plan:   Essential components of care per ACOG recommendations:  1.  Mood and well being: Patient with negative depression screening today - Patient tobacco use? No.   - hx of drug use? No.    2. Infant care and feeding:  -Patient currently  breastmilk feeding? Yes. Reviewed importance of draining breast regularly to support lactation. Reviewed importance of increased caloric intake  -Social determinants of health (SDOH) reviewed in EPIC. No concerns  3. Sexuality, contraception and birth spacing - Patient does not want a pregnancy in the next year.  Desired family size is 2 children.  - Reviewed forms of contraception in tiered fashion. Patient desired no method today. Considering Nexplanon  - Discussed birth spacing of 18 months  4. Sleep and fatigue -Encouraged family/partner/community support of 4 hrs of uninterrupted sleep to help with mood and fatigue  5. Physical Recovery  - Discussed patients delivery and complications. She describes her labor as good. - Patient had a C-section, no problems at delivery. Patient expressed understanding - Patient has urinary incontinence? No. - Patient is safe to resume physical and sexual activity  6.  Health Maintenance - HM due items addressed Yes - Last pap smear  Diagnosis  Date Value Ref Range Status  02/13/2020   Final   - Negative for intraepithelial lesion or malignancy (NILM)   Pap smear not done at today's visit.  -Breast Cancer screening indicated? No.   7. Chronic Disease/Pregnancy Condition follow up: None  - PCP follow up prn   Brand Males, CNM Center for [facility], [facility]  09/22/20 12:45 PM

## 2020-10-08 ENCOUNTER — Ambulatory Visit: Payer: Medicaid Other | Admitting: Medical

## 2021-02-17 ENCOUNTER — Ambulatory Visit: Payer: Medicaid Other | Admitting: Certified Nurse Midwife

## 2021-02-22 ENCOUNTER — Encounter: Payer: Self-pay | Admitting: Family Medicine

## 2021-02-22 ENCOUNTER — Ambulatory Visit (INDEPENDENT_AMBULATORY_CARE_PROVIDER_SITE_OTHER): Payer: Medicaid Other | Admitting: Family Medicine

## 2021-02-22 ENCOUNTER — Other Ambulatory Visit: Payer: Self-pay

## 2021-02-22 VITALS — BP 122/85 | HR 84 | Ht 65.0 in | Wt 220.0 lb

## 2021-02-22 DIAGNOSIS — Z309 Encounter for contraceptive management, unspecified: Secondary | ICD-10-CM | POA: Diagnosis not present

## 2021-02-22 NOTE — Progress Notes (Signed)
° °  GYNECOLOGY OFFICE VISIT NOTE  History:   Christy Jackson is a 36 y.o. [id number] here today to discuss contraception. She would like to hear all available options. She has not been on contraception before. No previous history of DVT/PE, migraines, smoking, or HTN. Does report a history of ectopic pregnancy. She denies any abnormal vaginal discharge, bleeding, pelvic pain or other concerns.   LMP 12/23. She was recently sexually active last week. Not using condoms, but has been monitoring cycle. Cycle has been coming monthly. Formula feeding infants, postpartum since 08/10/2020 with twins.    Past Medical History:  Diagnosis Date   Gestational diabetes    Medical history non-contributory     Past Surgical History:  Procedure Laterality Date   CESAREAN SECTION MULTI-GESTATIONAL N/A 08/10/2020   Procedure: CESAREAN SECTION MULTI-GESTATIONAL;  Surgeon: Middleton Bing, MD;  Location: [facility];  Service: Obstetrics;  Laterality: N/A;  Twin gestation   IVF   12/16/2019   LAPAROSCOPY N/A 01/16/2017   Procedure: LAPAROSCOPY OPERATIVE, RIGHT SALPINGECTOMY;  Surgeon: Tereso Newcomer, MD;  Location: [facility];  Service: Gynecology;  Laterality: N/A;   NECK SURGERY     "had something removed from neck several years ago.  it was not serious"    The following portions of the patient's history were reviewed and updated as appropriate: allergies, current medications, past family history, past medical history, past social history, past surgical history and problem list.   Health Maintenance:  Normal pap and negative HRHPV on 01/2020.   Review of Systems:  Pertinent items noted in HPI and remainder of comprehensive ROS otherwise negative.  Physical Exam:  BP 122/85    Pulse 84    Ht 5\' 5"  (1.651 m)    Wt 220 lb (99.8 kg)    LMP 02/05/2021 (Within Days)    Breastfeeding No    BMI 36.61 kg/m  CONSTITUTIONAL: Well-developed, well-nourished female in no acute distress.  HEENT:  Normocephalic,  atraumatic. NECK: Normal range of motion, supple, no masses noted on observation SKIN: No rash noted.  MUSCULOSKELETAL: Normal range of motion. No edema noted. NEUROLOGIC: Alert and oriented to person, place, and time.  PSYCHIATRIC: Normal mood and affect. Normal behavior. CARDIOVASCULAR: Normal heart rate noted RESPIRATORY: Effort normal, no problems with respiration noted PELVIC: Deferred  Assessment and Plan:   1. Encounter for contraceptive management, unspecified type Discussed available options, she is interested in the nexplanon. Discussed that the likelihood of pregnancy with this in place is very low, however if she were to become pregnant that there is a slightly increased risk that this pregnancy would be ectopic. Recommended abstinence through her next menstrual cycle (which should be starting in the next 2 weeks) and follow up during cycle/shortly after for placement to be reliably certain she is not pregnant.    Return in about 2 weeks (around 03/08/2021) for nexplanon placement .     03/10/2021, DO  OB Fellow, [facility], [facility] 02/23/2021 4:01 PM

## 2021-02-23 ENCOUNTER — Encounter: Payer: Self-pay | Admitting: Family Medicine

## 2021-03-17 ENCOUNTER — Ambulatory Visit: Payer: Medicaid Other | Admitting: Nurse Practitioner

## 2021-05-17 ENCOUNTER — Ambulatory Visit (INDEPENDENT_AMBULATORY_CARE_PROVIDER_SITE_OTHER): Payer: Medicaid Other | Admitting: Medical

## 2021-05-17 ENCOUNTER — Encounter: Payer: Self-pay | Admitting: Medical

## 2021-05-17 VITALS — BP 125/87 | HR 89 | Wt 222.3 lb

## 2021-05-17 DIAGNOSIS — Z3202 Encounter for pregnancy test, result negative: Secondary | ICD-10-CM | POA: Diagnosis not present

## 2021-05-17 DIAGNOSIS — Z30017 Encounter for initial prescription of implantable subdermal contraceptive: Secondary | ICD-10-CM

## 2021-05-17 LAB — POCT PREGNANCY, URINE: Preg Test, Ur: NEGATIVE

## 2021-05-17 MED ORDER — ETONOGESTREL 68 MG ~~LOC~~ IMPL
68.0000 mg | DRUG_IMPLANT | Freq: Once | SUBCUTANEOUS | Status: AC
  Administered 2021-05-17: 68 mg via SUBCUTANEOUS

## 2021-05-17 NOTE — Progress Notes (Signed)
GYNECOLOGY CLINIC PROCEDURE NOTE ? ?Ms. Fatemah Langton is a 36 y.o. [id number] here for Nexplanon insertion. No GYN concerns.  Last pap smear was on 02/13/2020 and was normal.  ? ?Nexplanon Insertion Procedure ?Patient was given informed consent, she signed consent form.  Patient does understand that irregular bleeding is a very common side effect of this medication. She was advised to have backup contraception for one week after placement. Pregnancy test in clinic today was negative.  Appropriate time out taken.  Patient's left arm was prepped, measured and marked for insertion area.  Patient was prepped with alcohol swab and then injected with 3 ml of 1% lidocaine.  She was prepped with betadine, Nexplanon removed from packaging,  Device confirmed in needle, then inserted full length of needle and withdrawn per handbook instructions. Nexplanon was able to palpated in the patient's arm; patient unable to palpate herself since she was also holding her child during the procedure. There was minimal blood loss.  Patient insertion site covered with guaze and a pressure bandage to reduce any bruising.  The patient tolerated the procedure well and was given post procedure instructions.  ? ? ?Marny Lowenstein, PA-C ?05/17/2021 1:21 PM  ? ?

## 2021-06-23 ENCOUNTER — Encounter (HOSPITAL_COMMUNITY): Payer: Self-pay

## 2021-06-23 ENCOUNTER — Ambulatory Visit (HOSPITAL_COMMUNITY)
Admission: RE | Admit: 2021-06-23 | Discharge: 2021-06-23 | Disposition: A | Discharge status: Home / Self Care | Payer: Medicaid Other | Source: Ambulatory Visit

## 2021-06-23 VITALS — BP 118/60 | HR 80 | Temp 99.1°F | Resp 14

## 2021-06-23 DIAGNOSIS — H60543 Acute eczematoid otitis externa, bilateral: Secondary | ICD-10-CM | POA: Diagnosis not present

## 2021-06-23 DIAGNOSIS — H7292 Unspecified perforation of tympanic membrane, left ear: Secondary | ICD-10-CM

## 2021-06-23 MED ORDER — OFLOXACIN 0.3 % OT SOLN: 10.0000 [drp] | Freq: Every day | OTIC | 0 refills | Status: AC

## 2021-06-23 NOTE — ED Provider Notes (Signed)
?[facility] ? ? ? ?CSN: [id number] ?Arrival date & time: 06/23/21  1514 ? ? ?  ? ?History   ?Chief Complaint ?Chief Complaint  ?Patient presents with  ? Ear Drainage  ? ? ?HPI ?Christy Jackson is a 36 y.o. female.  ? ?Patient presents with left ear pain and drainage after her daughter accidentally bumped her arm while she was scratching at her ear with a Q-tip.  Patient reports this happened yesterday and she has noticed bloody and brown drainage since then.  She denies fevers, sore throat, cough/congestion. ? ?Patient reports she has a chronic problem with itchy ears.  This has been ongoing for the past few years.  She frequently uses Q-tips to scratch her ears.  She denies any drainage from the ears; eczema in other areas of her body. ? ? ?Past Medical History:  ?Diagnosis Date  ? Gestational diabetes   ? Medical history non-contributory   ? ? ?Patient Active Problem List  ? Diagnosis Date Noted  ? Cesarean delivery delivered 08/10/2020  ? IUGR (intrauterine growth restriction) affecting care of mother 08/06/2020  ? Gestational diabetes mellitus (GDM), antepartum 06/08/2020  ? 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 In vitro fertilization 03/13/2020  ? Supervision of high risk pregnancy, antepartum 02/13/2020  ? Family history of Down syndrome 02/13/2020  ? AMA (advanced maternal age) multigravida 35+, unspecified trimester 02/13/2020  ? COVID-19 affecting pregnancy in first trimester 02/13/2020  ? Twin gestation, dichorionic diamniotic 01/22/2020  ? ? ?Past Surgical History:  ?Procedure Laterality Date  ? CESAREAN SECTION MULTI-GESTATIONAL N/A 08/10/2020  ? Procedure: CESAREAN SECTION MULTI-GESTATIONAL;  Surgeon: [place] Bing, MD;  Location: [facility];  Service: Obstetrics;  Laterality: N/A;  Twin gestation  ? IVF   12/16/2019  ? LAPAROSCOPY N/A 01/16/2017  ? Procedure: LAPAROSCOPY OPERATIVE, RIGHT SALPINGECTOMY;  Surgeon: Tereso Newcomer, MD;  Location: [facility];  Service:  Gynecology;  Laterality: N/A;  ? NECK SURGERY    ? "had something removed from neck several years ago.  it was not serious"  ? ? ?OB History   ? ? Gravida  ?5  ? Para  ?1  ? Term  ?   ? Preterm  ?1  ? AB  ?4  ? Living  ?2  ?  ? ? SAB  ?2  ? IAB  ?   ? Ectopic  ?2  ? Multiple  ?1  ? Live Births  ?2  ?   ?  ?  ? ? ? ?Home Medications   ? ?Prior to Admission medications   ?Medication Sig Start Date End Date Taking? Authorizing Provider  ?ofloxacin (FLOXIN) 0.3 % OTIC solution Place 10 drops into the left ear daily for 7 days. 06/23/21 06/30/21 Yes Valentino Nose, NP  ?Etonogestrel (NEXPLANON Constantine) Inject into the skin.    [provider]  ?ibuprofen (ADVIL) 600 MG tablet Take 1 tablet (600 mg total) by mouth every 6 (six) hours. 08/13/20   Arabella Merles, CNM  ? ? ?Family History ?Family History  ?Problem Relation Age of Onset  ? Diabetes Mother   ? Hypertension Mother   ? ? ?Social History ?Social History  ? ?Tobacco Use  ? Smoking status: Never  ? Smokeless tobacco: Never  ?Vaping Use  ? Vaping Use: Never used  ?Substance Use Topics  ? Alcohol use: No  ? Drug use: No  ? ? ? ?Allergies   ?Patient has no known allergies. ? ? ?Review of Systems ?  Review of Systems ?Per HPI ? ?Physical Exam ?Triage Vital Signs ?ED Triage Vitals  ?Enc Vitals Group  ?   BP 06/23/21 1533 118/60  ?   Pulse Rate 06/23/21 1533 80  ?   Resp 06/23/21 1533 14  ?   Temp 06/23/21 1533 99.1 ?F (37.3 ?C)  ?   Temp Source 06/23/21 1533 Oral  ?   SpO2 06/23/21 1533 96 %  ?   Weight --   ?   Height --   ?   Head Circumference --   ?   Peak Flow --   ?   Pain Score 06/23/21 1531 0  ?   Pain Loc --   ?   Pain Edu? --   ?   Excl. in GC? --   ? ?No data found. ? ?Updated Vital Signs ?BP 118/60 (BP Location: Right Arm)   Pulse 80   Temp 99.1 ?F (37.3 ?C) (Oral)   Resp 14   LMP  (LMP Unknown)   SpO2 96%   Breastfeeding No  ? ?Visual Acuity ?Right Eye Distance:   ?Left Eye Distance:   ?Bilateral Distance:   ? ?Right Eye Near:   ?Left Eye Near:     ?Bilateral Near:    ? ?Physical Exam ?Vitals and nursing note reviewed.  ?Constitutional:   ?   General: She is not in acute distress. ?   Appearance: Normal appearance. She is not toxic-appearing.  ?HENT:  ?   Head: Normocephalic and atraumatic.  ?   Right Ear: Tympanic membrane normal. Tympanic membrane is not injected, scarred, perforated, erythematous or retracted.  ?   Left Ear: Decreased hearing noted. Tympanic membrane is injected, perforated and erythematous.  ?   Ears:  ?   Comments: Dry/flaky skin in external auditory canals bilaterally; perforation appreciated in the 2 o'clock position in the left tympanic membrane; there is a little bit of white/thick drainage in the external auditory canal ?   Mouth/Throat:  ?   Mouth: Mucous membranes are moist.  ?   Pharynx: Oropharynx is clear.  ?Pulmonary:  ?   Effort: Pulmonary effort is normal. No respiratory distress.  ?Lymphadenopathy:  ?   Cervical: No cervical adenopathy.  ?Skin: ?   General: Skin is warm and dry.  ?   Capillary Refill: Capillary refill takes less than 2 seconds.  ?   Coloration: Skin is not jaundiced or pale.  ?   Findings: No erythema.  ?Neurological:  ?   Mental Status: She is alert and oriented to person, place, and time.  ?Psychiatric:     ?   Behavior: Behavior is cooperative.  ? ? ? ?UC Treatments / Results  ?Labs ?(all labs ordered are listed, but only abnormal results are displayed) ?Labs Reviewed - No data to display ? ?EKG ? ? ?Radiology ?No results found. ? ?Procedures ?Procedures (including critical care time) ? ?Medications Ordered in UC ?Medications - No data to display ? ?Initial Impression / Assessment and Plan / UC Course  ?I have reviewed the triage vital signs and the nursing notes. ? ?Pertinent labs & imaging results that were available during my care of the patient were reviewed by me and considered in my medical decision making (see chart for details). ? ?  ? Treat perforation of left tympanic membrane with ofloxacin  10 drops to the left ear daily for 7 days.   Water precautions discussed.  Encouraged close follow up if hearing does not substantially improve over  the next 2-4 weeks for re-evaluation.  Encouraged abstinence from use of Q-tips in ear to prevent re-injury.   ? ?To treat eczema of external auditory canals with very small amount of emollient in right ear and encouraged not using anything in left ear until perforation fully healed.  ? ?Will place Patient in contact with primary care assistance.  Contact information given for ear nose and throat specialist. ? ?The patient was given the opportunity to ask questions.  All questions answered to their satisfaction.  The patient is in agreement to this plan.  ? ?Final Clinical Impressions(s) / UC Diagnoses  ? ?Final diagnoses:  ?Perforation of left tympanic membrane  ?Eczema of both external ears  ? ? ? ?Discharge Instructions   ? ?  ?- Please start the ofloxacin eardrops 10 drops to the left ear daily for 7 days; if your hearing is not slowly returning and the drainage is not slowing, please return or follow-up with the ear nose and throat doctor. ?-Please have your ears checked in 2 to 4 weeks ?-Do not go underwater or let any water or fluid get in your left ear ; you can apply a Vaseline moistened cottonball to the left ear while you are taking a bath or shower ?-You have eczema in your ear canals which is causing the itching.  You can put a very small amount of Vaseline on your right external canal to help with the itching.  You can also use Benadryl at nighttime to help with itch. ?-Please do not use Q-tips or insert anything into your ear to prevent trauma ?-We will help coordinate a primary care provider for you ; I have added you to the patient assistance primary care ?-Follow-up with primary care or ear nose and throat if your symptoms persist despite this treatment ? ? ? ?ED Prescriptions   ? ? Medication Sig Dispense Auth. Provider  ? ofloxacin (FLOXIN) 0.3 %  OTIC solution Place 10 drops into the left ear daily for 7 days. 5 mL Valentino NoseMartinez, Hao Dion A, NP  ? ?  ? ?PDMP not reviewed this encounter. ?  ?Valentino NoseMartinez, Nikky Duba A, NP ?06/23/21 1702 ? ?

## 2021-06-23 NOTE — Discharge Instructions (Addendum)
-   Please start the ofloxacin eardrops 10 drops to the left ear daily for 7 days; if your hearing is not slowly returning and the drainage is not slowing, please return or follow-up with the ear nose and throat doctor. ?-Please have your ears checked in 2 to 4 weeks ?-Do not go underwater or let any water or fluid get in your left ear ; you can apply a Vaseline moistened cottonball to the left ear while you are taking a bath or shower ?-You have eczema in your ear canals which is causing the itching.  You can put a very small amount of Vaseline on your right external canal to help with the itching.  You can also use Benadryl at nighttime to help with itch. ?-Please do not use Q-tips or insert anything into your ear to prevent trauma ?-We will help coordinate a primary care provider for you ; I have added you to the patient assistance primary care ?-Follow-up with primary care or ear nose and throat if your symptoms persist despite this treatment ?

## 2021-06-23 NOTE — ED Triage Notes (Addendum)
Patient c/o LFT ear drainage x 4-5 days.  ? ?Patient denies pain.  ? ?Patient states " I think something has ruptured". Patient states " my daughter hit my hand when I was using a Q tip to clean my ear and then I felt something rupture".  ? ?Patient endorse ear itchiness. Patient endorses difficulty hearing out of LFT ear. ? ?Patient endorses bloody and brownish drainage per patient statement.  ? ?Patient hasn't taking any medications for symptoms.  ?

## 2021-08-29 IMAGING — US US MFM OB FOLLOW-UP
1 series · 14 of 28 positions shown · non-contrast
Comparison: none

[Series 1: us mfm ob follow-up · 83 acquisitions, 14 frames shown]
[im 4/83]
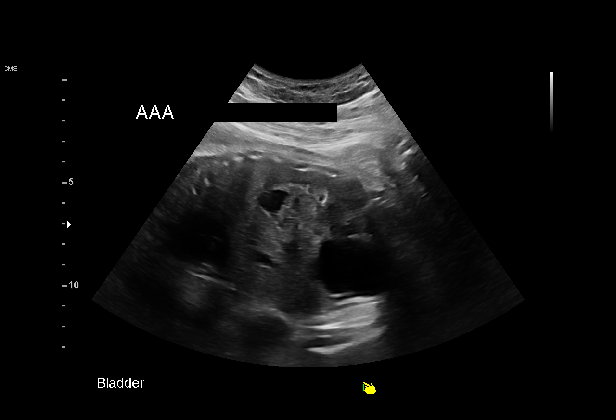
[im 10/83]
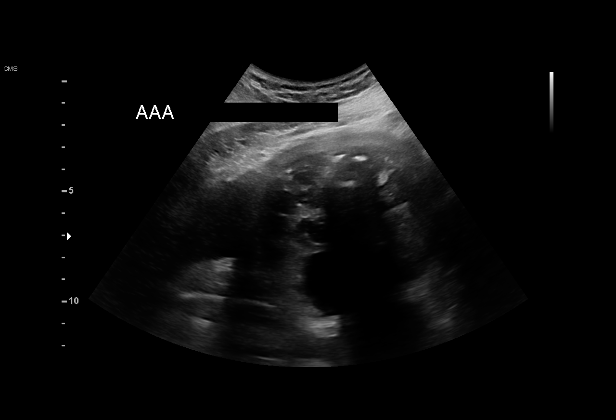
[im 16/83]
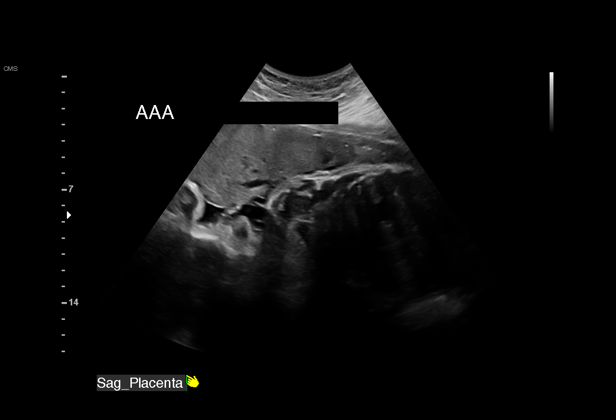
[im 22/83]
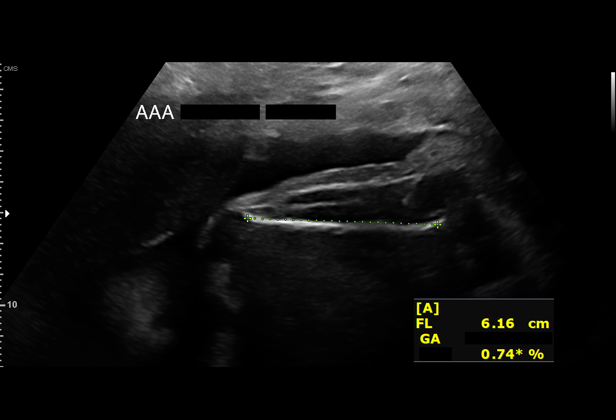
[im 28/83]
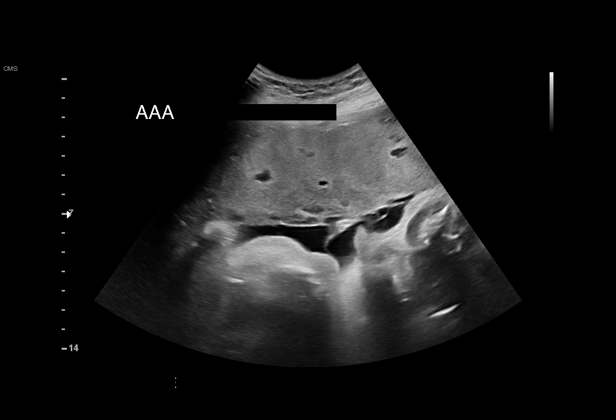
[im 34/83]
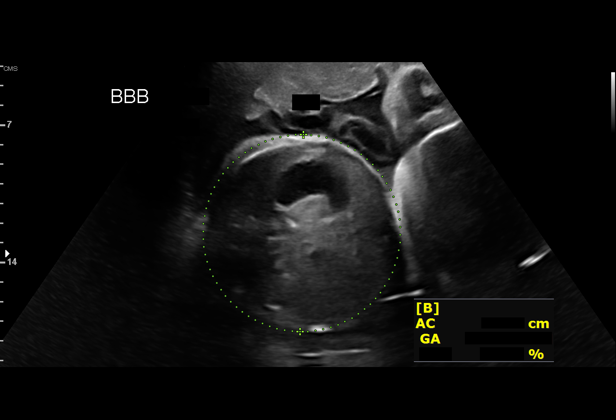
[im 40/83]
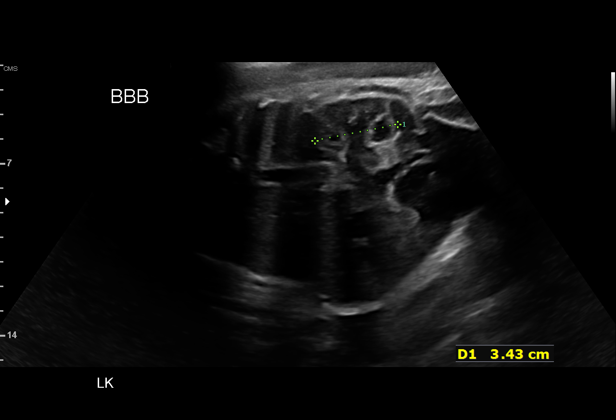
[im 46/83]
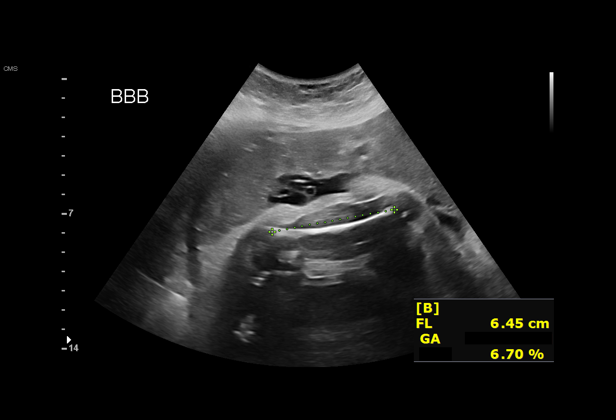
[im 52/83]
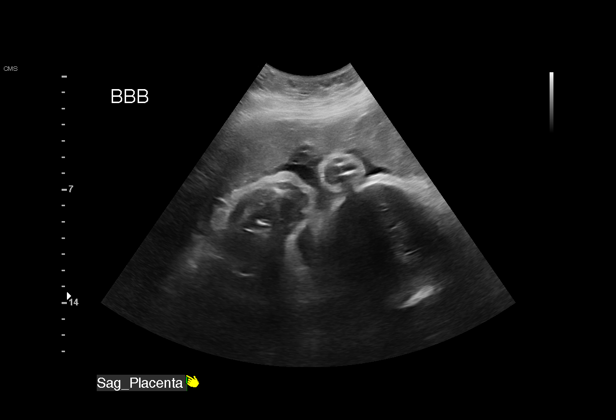
[im 58/83]
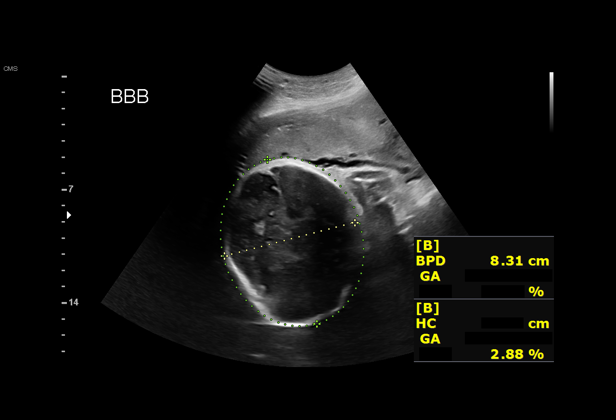
[im 64/83]
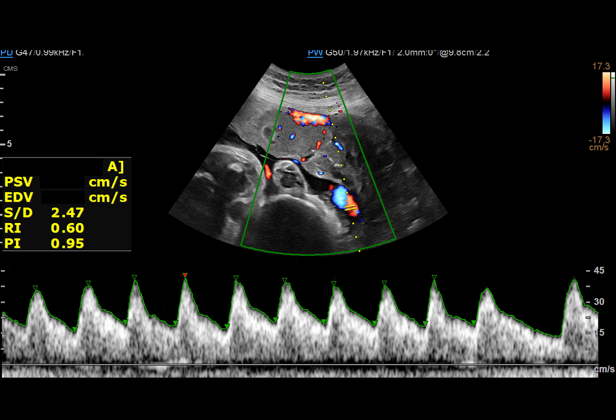
[im 70/83]
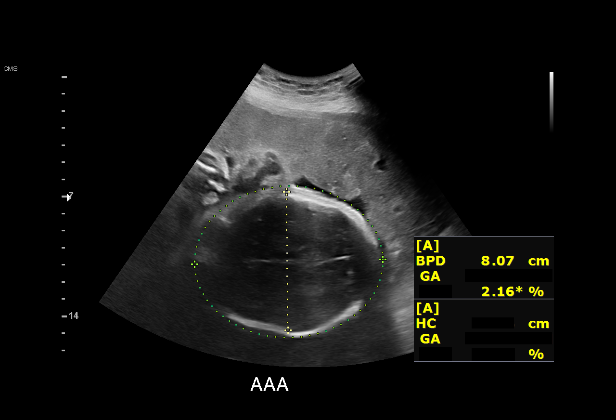
[im 76/83]
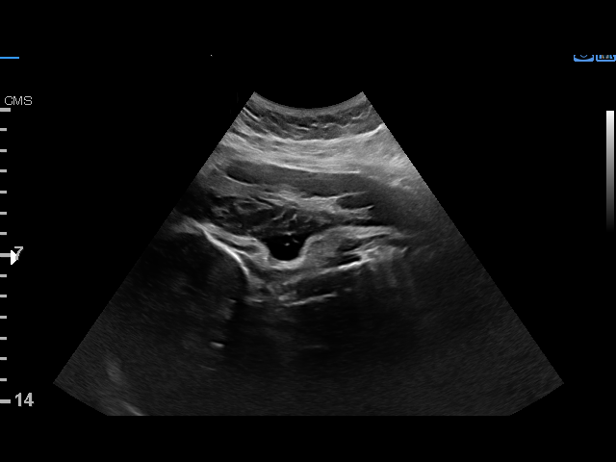
[im 83/83]
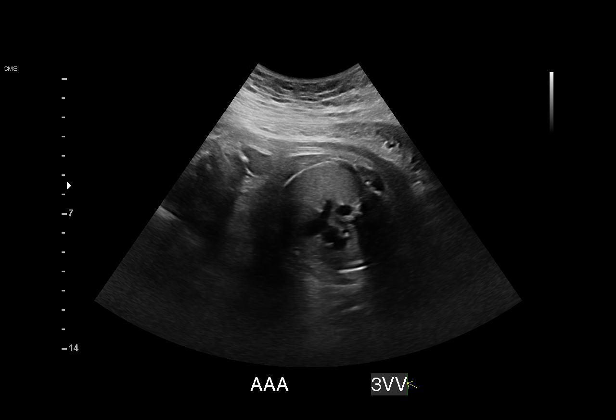

[14 of 28 positions shown; findings below may reference images not displayed]

GEST
    W/NONSTRESS ADD'L GEST
    W/NONSTRESS

Indications

 Twin pregnancy, di/di, third trimester
 35 weeks gestation of pregnancy
 Gestational diabetes in pregnancy,
 unspecified control
 Advanced maternal age multigravida 35+,
 third trimester
 Genetic carrier (Isovaleric Acidemia)
 Family history of congenital anomaly (T21)
 Pregnancy resulting from assisted
 reproductive technology (IVF)
Fetal Evaluation (Fetus A)
 Num Of Fetuses:         2
 Fetal Heart Rate(bpm):  143
 Cardiac Activity:       Arrhythmia noted
 Fetal Lie:              Lower Fetus
 Presentation:           Breech
 Placenta:               Anterior
 P. Cord Insertion:      Previously Visualized
 Membrane Desc:      Dividing Membrane seen - Dichorionic.

 Amniotic Fluid
 AFI FV:      Within normal limits

                             Largest Pocket(cm)

Biophysical Evaluation (Fetus A)

 Amniotic F.V:   Within normal limits       F. Tone:        Not Observed
 F. Movement:    Observed                   N.S.T:          Reactive
 F. Breathing:   Observed                   Score:          [DATE]
Biometry (Fetus A)

 BPD:      80.4  mm     G. Age:  32w 2d        1.6  %    CI:        66.97   %    70 - 86
                                                         FL/HC:      19.3   %    20.1 -
 HC:      314.7  mm     G. Age:  35w 2d         21  %    HC/AC:      1.09        0.93 -
 AC:      289.8  mm     G. Age:  33w 0d          7  %    FL/BPD:     75.6   %    71 - 87
 FL:       60.8  mm     G. Age:  31w 4d        < 1  %    FL/AC:      21.0   %    20 - 24
 HUM:      54.4  mm     G. Age:  31w 4d        < 5  %

 Est. FW:    6616  gm      4 lb 8 oz    3.9  %     FW Discordancy        15  %
OB History

 Gravidity:    5          SAB:   2
 Ectopic:      2        Living:  0
Gestational Age (Fetus A)

 U/S Today:     33w 0d                                        EDD:   09/18/20
 Best:          35w 1d     Det. By:  Early Ultrasound         EDD:   09/03/20
                                     (01/22/20)
Anatomy (Fetus A)

 Cranium:               Previously seen        LVOT:                   Previously seen
 Cavum:                 Previously seen        Aortic Arch:            Previously seen
 Ventricles:            Previously seen        Ductal Arch:            Previously seen
 Choroid Plexus:        Previously seen        Diaphragm:              Previously seen
 Cerebellum:            Previously seen        Stomach:                Appears normal, left
                                                                       sided
 Posterior Fossa:       Previously seen        Abdomen:                Previously seen
 Nuchal Fold:           Previously seen        Abdominal Wall:         Previously seen
 Face:                  Orbits and profile     Cord Vessels:           Previously seen
                        previously seen
 Lips:                  Previously seen        Kidneys:                Appear normal
 Palate:                Not well visualized    Bladder:                Appears normal
 Thoracic:              Appears normal         Spine:                  Previously seen
                        Previously seen
 Heart:                 Previously seen EIF    Upper Extremities:      Previously seen
 RVOT:                  Not well visualized    Lower Extremities:      Previously seen

 Other:  Technically difficult due to fetal position and advanced GA.
Doppler - Fetal Vessels (Fetus A)

 Umbilical Artery
  S/D     %tile      RI    %tile      PI    %tile            ADFV    RDFV
  2.38       45    0.58       51     0.9       62               No      No

Fetal Evaluation (Fetus B)

 Num Of Fetuses:         2
 Fetal Heart Rate(bpm):  125
 Cardiac Activity:       Observed
 Fetal Lie:              Upper Fetus
 Presentation:           Transverse, head to maternal right
 Placenta:               Anterior
 P. Cord Insertion:      Previously Visualized
 Membrane Desc:      Dividing Membrane seen - Dichorionic.

 Amniotic Fluid
 AFI FV:      Within normal limits

                             Largest Pocket(cm)

Biophysical Evaluation (Fetus B)

 Amniotic F.V:   Within normal limits       F. Tone:        Observed
 F. Movement:    Observed                   N.S.T:          Reactive
 F. Breathing:   Observed                   Score:          [DATE]
Biometry (Fetus B)

 BPD:      81.6  mm     G. Age:  32w 6d        4.1  %    CI:        72.47   %    70 - 86
                                                         FL/HC:      21.1   %    20.1 -
 HC:      304.9  mm     G. Age:  33w 6d        3.6  %    HC/AC:      0.97        0.93 -
 AC:      314.3  mm     G. Age:  35w 3d         64  %    FL/BPD:     78.7   %    71 - 87
 FL:       64.2  mm     G. Age:  33w 1d          6  %    FL/AC:      20.4   %    20 - 24
 HUM:      55.8  mm     G. Age:  32w 3d         12  %

 Est. FW:    8821  gm      5 lb 5 oz     26  %     FW Discordancy     0 \ 15 %
Gestational Age (Fetus B)

 U/S Today:     33w 6d                                        EDD:   09/12/20
 Best:          35w 1d     Det. By:  Early Ultrasound         EDD:   09/03/20
                                     (01/22/20)
Anatomy (Fetus B)

 Cranium:               Previously seen        LVOT:                   Not well visualized
 Cavum:                 Previously seen        Aortic Arch:            Previously seen
 Ventricles:            Previously seen        Ductal Arch:            Not well visualized
 Choroid Plexus:        Previously seen        Diaphragm:              Previously seen
 Cerebellum:            Previously seen        Stomach:                Appears normal, left
                                                                       sided
 Posterior Fossa:       Previously seen        Abdomen:                Previously seen
 Nuchal Fold:           Previously seen        Abdominal Wall:         Previously seen
 Face:                  Orbits and profile     Cord Vessels:           Previously seen
                        previously seen
 Lips:                  Previously seen        Kidneys:                Appear normal
 Palate:                Not well visualized    Bladder:                Appears normal
 Thoracic:              Previously seen        Spine:                  Limited views
                                                                       previously seen
 Heart:                 Previously seen        Upper Extremities:      Previously seen
 RVOT:                  Not well visualized    Lower Extremities:      Previously seen

 Other:  Technically difficult due to fetal position and advanced GA
Doppler - Fetal Vessels (Fetus B)

 Umbilical Artery
  S/D     %tile      RI    %tile      PI    %tile            ADFV    RDFV
  2.75       68    0.64       75    1.05       85               No      No

Cervix Uterus Adnexa

 Cervix
 Not visualized (advanced GA >65wks)

 Uterus
 No abnormality visualized.

 Right Ovary
 Not visualized.

 Left Ovary
 Not visualized.

 Cul De Sac
 No free fluid seen.

 Adnexa
 No abnormality visualized.
Comments

 This patient was seen for a follow up growth scan due to a
 dichorionic diamniotic twin gestation.  She denies any
 problems since her last exam and reports feeling vigorous
 fetal movements of both fetuses throughout the day.
 [name]: EFW 4 pounds 8 ounces (4th percentile for her
 gestational age).  Normal amniotic fluid.  Doppler studies of
 the umbilical arteries showed continued normal forward flow.
 There were no signs of absent or reversed end-diastolic flow
 noted.  Biophysical profile [DATE].  She received a -2 for
 fetal breathing movements that did not meet criteria.  She
 subsequently had a reactive NST for [name] making her total
 biophysical profile score [DATE].
 [name]: EFW 5 pounds 5 ounce (26th percentile for her
 gestational age).  Normal amniotic fluid.  Doppler studies of
 the umbilical arteries showed continued normal forward flow.
 There were no signs of absent or reversed end-diastolic flow
 noted.  Biophysical profile [DATE].  She also had a reactive
 NST for [name] making her total biophysical profile score [DATE].
 Due to fetal growth restriction of [name] in a dichorionic twin
 gestation, delivery is recommended at around 37 weeks.  We
 will increase the frequency of fetal testing to twice weekly
 until delivery.  She will return on [REDACTED]s to our office for an
 NST.  We will perform a biophysical profile and umbilical
 artery Doppler studies on [REDACTED]s.

 Fetal Naomi Inzunza instructions were reviewed today.

 A total of 15 minutes was spent counseling and coordinating
 the care for this patient.  Greater than 50% of the time was
 spent in direct face-to-face contact.

## 2021-09-23 ENCOUNTER — Ambulatory Visit: Payer: Medicaid Other | Admitting: Family Medicine

## 2023-11-10 ENCOUNTER — Ambulatory Visit: Payer: Self-pay

## 2023-11-10 NOTE — Telephone Encounter (Signed)
  Reason for Disposition  [1] MILD pain (e.g., does not interfere with normal activities) AND [2] present > 7 days  Answer Assessment - Initial Assessment Questions Went to UC, blood sugar level was 178. Patient states she had eaten 4-5 hours before that testing. Denis swelling or discoloration in legs and feet. Patient is requesting her Nexplanon  birth control be taken out, as its been in for 3 years. Advised patient this would have to be done in a separate visit but to bring it up on Monday at new pt appointment.   1. ONSET: When did the pain start?      About a week ago  2. LOCATION: Where is the pain located?      Mostly left foot, sharp sudden pain, but then sometimes feels like been walking for 2 hours but hasn't been   3. PAIN: How bad is the pain?    (Scale 1-10; or mild, moderate, severe)     Mild- moderate  4. OTHER SYMPTOMS: Do you have any other symptoms? (e.g., leg pain, rash, fever, numbness)     Tired all the time, wants to stay in bed, started 10 days to a week ago  Protocols used: Foot Pain-A-AH  Copied from CRM [id number]. Topic: Clinical - Red Word Triage >> Nov 10, 2023 12:33 PM Suzen RAMAN wrote: Red Word that prompted transfer to Nurse Triage: fatigue and feet pain. 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

## 2023-11-13 ENCOUNTER — Ambulatory Visit: Payer: Self-pay

## 2023-11-13 NOTE — Telephone Encounter (Signed)
 Patient calling to schedule sooner New Patient appt. Than the Sept. 2026' appointment for symptoms below, call was disconnected while patient was discussing Fatigue. Will call patient back at a later time.      Copied from CRM #[id number]. Topic: Clinical - Red Word Triage >> Nov 13, 2023  1:12 PM Pinkey ORN wrote: [facility] that prompted transfer to Nurse Triage: Fatigue >> Nov 13, 2023  1:14 PM Pinkey ORN wrote: Patient states she's been fatigue for the last few weeks now. Patient also mentions at her most recent ER visit her blood sugar was extremely low and her blood pressure was extremely high. Patient also mentions it's past time for her Nexplanon  implant to be removed.

## 2023-11-13 NOTE — Telephone Encounter (Signed)
 This RN spoke with patient regarding symptoms. Patient states she already spoke with someone and made an appointment for tomorrow. Patient urged to seek treatment at nearest ED if symptoms worsen.

## 2023-11-14 ENCOUNTER — Ambulatory Visit: Payer: Self-pay | Admitting: Family Medicine

## 2023-11-14 ENCOUNTER — Encounter: Payer: Self-pay | Admitting: Family Medicine

## 2023-11-14 VITALS — BP 114/78 | HR 90 | Ht 63.0 in | Wt 237.0 lb

## 2023-11-14 DIAGNOSIS — R7303 Prediabetes: Secondary | ICD-10-CM

## 2023-11-14 DIAGNOSIS — R5383 Other fatigue: Secondary | ICD-10-CM

## 2023-11-14 DIAGNOSIS — Z975 Presence of (intrauterine) contraceptive device: Secondary | ICD-10-CM

## 2023-11-14 DIAGNOSIS — Z13220 Encounter for screening for lipoid disorders: Secondary | ICD-10-CM

## 2023-11-14 DIAGNOSIS — Z0000 Encounter for general adult medical examination without abnormal findings: Secondary | ICD-10-CM

## 2023-11-14 LAB — COMPREHENSIVE METABOLIC PANEL WITH GFR
ALT: 18 U/L (ref 0–35)
AST: 16 U/L (ref 0–37)
Albumin: 4.1 g/dL (ref 3.5–5.2)
Alkaline Phosphatase: 86 U/L (ref 39–117)
BUN: 8 mg/dL (ref 6–23)
CO2: 27 meq/L (ref 19–32)
Calcium: 9.3 mg/dL (ref 8.4–10.5)
Chloride: 103 meq/L (ref 96–112)
Creatinine, Ser: 0.5 mg/dL (ref 0.40–1.20)
GFR: 118.88 mL/min (ref >60.00)
Glucose, Bld: 108 mg/dL — ABNORMAL HIGH (ref 70–99)
Potassium: 3.9 meq/L (ref 3.5–5.1)
Sodium: 138 meq/L (ref 135–145)
Total Bilirubin: 0.4 mg/dL (ref 0.2–1.2)
Total Protein: 7.2 g/dL (ref 6.0–8.3)

## 2023-11-14 LAB — LIPID PANEL
Cholesterol: 150 mg/dL (ref 0–200)
HDL: 51.4 mg/dL (ref >39.00)
LDL Cholesterol: 68 mg/dL (ref 0–99)
NonHDL: 98.15
Total CHOL/HDL Ratio: 3
Triglycerides: 153 mg/dL — ABNORMAL HIGH (ref 0.0–149.0)
VLDL: 30.6 mg/dL (ref 0.0–40.0)

## 2023-11-14 LAB — CBC WITH DIFFERENTIAL/PLATELET
Basophils Absolute: 0 K/uL (ref 0.0–0.1)
Basophils Relative: 0.5 % (ref 0.0–3.0)
Eosinophils Absolute: 0.1 K/uL (ref 0.0–0.7)
Eosinophils Relative: 1.6 % (ref 0.0–5.0)
HCT: 38.5 % (ref 36.0–46.0)
Hemoglobin: 12.8 g/dL (ref 12.0–15.0)
Lymphocytes Relative: 44.2 % (ref 12.0–46.0)
Lymphs Abs: 2.8 K/uL (ref 0.7–4.0)
MCHC: 33.4 g/dL (ref 30.0–36.0)
MCV: 86.3 fl (ref 78.0–100.0)
Monocytes Absolute: 0.5 K/uL (ref 0.1–1.0)
Monocytes Relative: 8.5 % (ref 3.0–12.0)
Neutro Abs: 2.9 K/uL (ref 1.4–7.7)
Neutrophils Relative %: 45.2 % (ref 43.0–77.0)
Platelets: 247 K/uL (ref 150.0–400.0)
RBC: 4.46 Mil/uL (ref 3.87–5.11)
RDW: 13.5 % (ref 11.5–15.5)
WBC: 6.4 K/uL (ref 4.0–10.5)

## 2023-11-14 LAB — IBC + FERRITIN
Ferritin: 80.4 ng/mL (ref 10.0–291.0)
Iron: 89 ug/dL (ref 42–145)
Saturation Ratios: 27.1 % (ref 20.0–50.0)
TIBC: 329 ug/dL (ref 250.0–450.0)
Transferrin: 235 mg/dL (ref 212.0–360.0)

## 2023-11-14 LAB — B12 AND FOLATE PANEL
Folate: 22.3 ng/mL (ref >5.9)
Vitamin B-12: 211 pg/mL (ref 211–911)

## 2023-11-14 LAB — TSH: TSH: 1.49 u[IU]/mL (ref 0.35–5.50)

## 2023-11-14 LAB — HEMOGLOBIN A1C: Hgb A1c MFr Bld: 6.6 % — ABNORMAL HIGH (ref 4.6–6.5)

## 2023-11-14 NOTE — Patient Instructions (Addendum)
 [email or web address]   ---------------------------------------   Thank you for choosing [facility] at [facility] for your Primary Care needs. I am excited for the opportunity to partner with you to meet your health care goals. It was a pleasure meeting you today!  Information on diet, exercise, and health maintenance recommendations are listed below. This is information to help you be sure you are on track for optimal health and monitoring.   Please look over this and let us  know if you have any questions or if you have completed any of the health maintenance outside of [facility] so that we can be sure your records are up to date.  ___________________________________________________________  MyChart:  For all urgent or time sensitive needs we ask that you please call the office to avoid delays. Our number is [phone]. MyChart is not constantly monitored and due to the large volume of messages a day, replies may take up to 72 business hours.  MyChart Policy: MyChart allows for you to see your visit notes, after visit summary, provider recommendations, lab and tests results, make an appointment, request refills, and contact your provider or the office for non-urgent questions or concerns. Providers are seeing patients during normal business hours and do not have built in time to review MyChart messages.  We ask that you allow a minimum of 3 business days for responses to [employer]. For this reason, please do not send urgent requests through MyChart. Please call the office at [phone]. New and ongoing conditions may require a visit. We have virtual and in-person visits available for your convenience.  Complex MyChart concerns may require a visit. Your provider may request you schedule a virtual or in-person visit to ensure we are providing the best care possible. MyChart messages sent after 11:00 AM on Friday may not be received by the provider until Monday  morning.    Lab and Test Results: You will receive your lab and test results on MyChart as soon as they are completed and results have been sent by the lab or testing facility. Due to this service, you will receive your results BEFORE your provider.  I review lab and test results each morning prior to seeing patients. Some results require collaboration with other providers to ensure you are receiving the most appropriate care. For this reason, we ask that you please allow a minimum of 3-5 business days from the time that ALL results have been received for your provider to receive and review lab and test results and contact you about these.  Most lab and test result comments from the provider will be sent through MyChart. Your provider may recommend changes to the plan of care, follow-up visits, repeat testing, ask questions, or request an office visit to discuss these results. You may reply directly to this message or call the office to provide information for the provider or set up an appointment. In some instances, you will be called with test results and recommendations. Please let us  know if this is preferred and we will make note of this in your chart to provide this for you.    If you have not heard a response to your lab or test results in 5 business days from all results returning to MyChart, please call the office to let us  know. We ask that you please avoid calling prior to this time unless there is an emergent concern. Due to high call volumes, this can delay the resulting process.  After Hours: For  all non-emergency after hours needs, please call the office at [phone] and select the option to reach the on-call  service. On-call services are shared between multiple [facility] offices and therefore it will not be possible to speak directly with your provider. On-call providers may provide medical advice and recommendations, but are unable to provide refills for maintenance medications.   For all emergency or urgent medical needs after normal business hours, we recommend that you seek care at the closest Urgent Care or Emergency Department to ensure appropriate treatment in a timely manner.  [facility] has a 24 hour emergency room located on the ground floor for your convenience.   Urgent Concerns During the Business Day Providers are seeing patients from 8AM to 5PM with a busy schedule and are most often not able to respond to non-urgent calls until the end of the day or the next business day. If you should have URGENT concerns during the day, please call and speak to the nurse or schedule a same day appointment so that we can address your concern without delay.   Thank you, again, for choosing me as your health care partner. I appreciate your trust and look forward to learning more about you!   Waddell FURY Almarie, DNP, FNP-C  ___________________________________________________________  Health Maintenance Recommendations Screening Testing Mammogram Every 1-2 years based on history and risk factors Starting at age 60 Pap Smear Ages 21-39 every 3 years Ages 8-65 every 5 years with HPV testing More frequent testing may be required based on results and history Colon Cancer Screening Every 1-10 years based on test performed, risk factors, and history Starting at age 56 Bone Density Screening Every 2-10 years based on history Starting at age 52 for women Recommendations for men differ based on medication usage, history, and risk factors AAA Screening One time ultrasound Men 27-29 years old who have ever smoked Lung Cancer Screening Low Dose Lung CT every 12 months Age 29-80 years with a 20 pack-year smoking history who still smoke or who have quit within the last 15 years  Screening Labs Routine  Labs: Complete Blood Count (CBC), Complete Metabolic Panel (CMP), Cholesterol (Lipid Panel) Every 6-12 months based on history and medications May be recommended  more frequently based on current conditions or previous results Hemoglobin A1c Lab Every 3-12 months based on history and previous results Starting at age 81 or earlier with diagnosis of diabetes, high cholesterol, BMI >26, and/or risk factors Frequent monitoring for patients with diabetes to ensure blood sugar control Thyroid Panel  Every 6 months based on history, symptoms, and risk factors May be repeated more often if on medication HIV One time testing for all patients 8 and older May be repeated more frequently for patients with increased risk factors or exposure Hepatitis C One time testing for all patients 61 and older May be repeated more frequently for patients with increased risk factors or exposure Gonorrhea, Chlamydia Every 12 months for all sexually active persons 13-24 years Additional monitoring may be recommended for those who are considered high risk or who have symptoms PSA Men 28-15 years old with risk factors Additional screening may be recommended from age 35-69 based on risk factors, symptoms, and history  Vaccine Recommendations Tetanus Booster All adults every 10 years Flu Vaccine All patients 6 months and older every year COVID Vaccine All patients 12 years and older Initial dosing with booster May recommend additional booster based on age and health history HPV Vaccine 2 doses all patients  age 84-26 Dosing may be considered for patients over 26 Shingles Vaccine (Shingrix) 2 doses all adults 50 years and older Pneumonia (Pneumovax 47) All adults 65 years and older May recommend earlier dosing based on health history Pneumonia (Prevnar 110) All adults 65 years and older Dosed 1 year after Pneumovax 23 Pneumonia (Prevnar 20) All adults 65 years and older (adults 19-64 with certain conditions or risk factors) 1 dose  For those who have not received Prevnar 13 vaccine previously   Additional Screening, Testing, and Vaccinations may be recommended on  an individualized basis based on family history, health history, risk factors, and/or exposure.  __________________________________________________________  Diet Recommendations for All Patients  I recommend that all patients maintain a diet low in saturated fats, carbohydrates, and cholesterol. While this can be challenging at first, it is not impossible and small changes can make big differences.  Things to try: Decreasing the amount of soda, sweet tea, and/or juice to one or less per day and replace with water While water is always the first choice, if you do not like water you may consider adding a water additive without sugar to improve the taste other sugar free drinks Replace potatoes with a brightly colored vegetable  Use healthy oils, such as canola oil or olive oil, instead of butter or hard margarine Limit your bread intake to two pieces or less a day Replace regular pasta with low carb pasta options Bake, broil, or grill foods instead of frying Monitor portion sizes  Eat smaller, more frequent meals throughout the day instead of large meals  An important thing to remember is, if you love foods that are not great for your health, you don't have to give them up completely. Instead, allow these foods to be a reward when you have done well. Allowing yourself to still have special treats every once in a while is a nice way to tell yourself thank you for working hard to keep yourself healthy.   Also remember that every day is a new day. If you have a bad day and fall off the wagon, you can still climb right back up and keep moving along on your journey!  We have resources available to help you!  Some websites that may be helpful include: www.http://www.wall-moore.info/  Www.VeryWellFit.com _____________________________________________________________  Activity Recommendations for All Patients  I recommend that all adults get at least 30 minutes of moderate physical activity that elevates your  heart rate at least 5 days out of the week.  Some examples include: Walking or jogging at a pace that allows you to carry on a conversation Cycling (stationary bike or outdoors) Water aerobics Yoga Weight lifting Dancing If physical limitations prevent you from putting stress on your joints, exercise in a pool or seated in a chair are excellent options.  Do determine your MAXIMUM heart rate for activity: 220 - YOUR AGE = MAX Heart Rate   Remember! Do not push yourself too hard.  Start slowly and build up your pace, speed, weight, time in exercise, etc.  Allow your body to rest between exercise and get good sleep. You will need more water than normal when you are exerting yourself. Do not wait until you are thirsty to drink. Drink with a purpose of getting in at least 8, 8 ounce glasses of water a day plus more depending on how much you exercise and sweat.    If you begin to develop dizziness, chest pain, abdominal pain, jaw pain, shortness of breath, headache, vision  changes, lightheadedness, or other concerning symptoms, stop the activity and allow your body to rest. If your symptoms are severe, seek emergency evaluation immediately. If your symptoms are concerning, but not severe, please let us  know so that we can recommend further evaluation.

## 2023-11-14 NOTE — Progress Notes (Signed)
 New Patient Office Visit  Subjective    Patient ID: Christy Jackson, female    DOB: July 29, 1985  Age: 38 y.o. MRN: [id number]  CC:  Chief Complaint  Patient presents with   Establish Care    HPI Christy Jackson presents to establish care. No recent primary care.  She lives with her spouse and 2 kids (twins).    Discussed the use of AI scribe software for clinical note transcription with the patient, who gave verbal consent to proceed.  History of Present Illness Christy Jackson is a 38 year old female who presents to establish care with fatigue and weight gain.  She experiences significant fatigue, describing it as an overwhelming tiredness and difficulty getting up from bed. This fatigue has been persistent and is impacting her daily life.  She has noticed weight gain, which she attributes to her current birth control method, Nexplanon . She feels that this weight gain is contributing to her fatigue and mentions discomfort in her feet due to the added weight. She has had the Nexplanon  implant in place for three years.  She has a history of gestational diabetes and prediabetes, raising concerns about progressing towards diabetes. Her family history includes diabetes and high blood pressure on her mother's side.  She reports experiencing high blood pressure readings at home, particularly when she is not sleeping well or is stressed.  She describes feeling 'blah' and not wanting to go out or see anyone, which she feels is related to her current physical state. She lives with her spouse and two young children, including three-year-old twin girls. She does not consume alcohol, drugs, or nicotine.      Outpatient Encounter Medications as of 11/14/2023  Medication Sig   Etonogestrel  (NEXPLANON  [place]) Inject into the skin.   ibuprofen  (ADVIL ) 600 MG tablet Take 1 tablet (600 mg total) by mouth every 6 (six) hours.   No facility-administered encounter medications on file as of 11/14/2023.     Past Medical History:  Diagnosis Date   Gestational diabetes    Medical history non-contributory     Past Surgical History:  Procedure Laterality Date   CESAREAN SECTION MULTI-GESTATIONAL N/A 08/10/2020   Procedure: CESAREAN SECTION MULTI-GESTATIONAL;  Surgeon: Izell Harari, MD;  Location: [facility] OR[facility];  Service: Obstetrics;  Laterality: N/A;  Twin gestation   IVF   12/16/2019   LAPAROSCOPY N/A 01/16/2017   Procedure: LAPAROSCOPY OPERATIVE, RIGHT SALPINGECTOMY;  Surgeon: Herchel Gloris LABOR, MD;  Location: [facility] ORS;  Service: Gynecology;  Laterality: N/A;    Family History  Problem Relation Age of Onset   Diabetes Mother    Hypertension Mother     Social History   Socioeconomic History   Marital status: Married    Spouse name: Not on file   Number of children: Not on file   Years of education: Not on file   Highest education level: Not on file  Occupational History   Not on file  Tobacco Use   Smoking status: Never   Smokeless tobacco: Never  Vaping Use   Vaping status: Never Used  Substance and Sexual Activity   Alcohol use: No   Drug use: No   Sexual activity: Not Currently    Birth control/protection: None  Other Topics Concern   Not on file  Social History Narrative   Not on file   Social Drivers of Health   Financial Resource Strain: Not on file  Food Insecurity: No Food Insecurity (02/22/2021)   Hunger Vital Sign  Worried About Programme researcher, broadcasting/film/video in the Last Year: Never true    Ran Out of Food in the Last Year: Never true  Transportation Needs: No Transportation Needs (02/22/2021)   PRAPARE - Administrator, Civil Service (Medical): No    Lack of Transportation (Non-Medical): No  Physical Activity: Not on file  Stress: Not on file  Social Connections: Unknown (06/23/2021)   [facility] from [facility]   Social Network    Social Network: Not on file  Intimate Partner Violence: Unknown (05/21/2021)   Received from [facility]   HITS     Physically Hurt: Not on file    Insult or Talk Down To: Not on file    Threaten Physical Harm: Not on file    Scream or Curse: Not on file    ROS All review of systems negative except what is listed in the HPI    Objective    BP 114/78   Pulse 90   Ht 5' 3 (1.6 m)   Wt 237 lb (107.5 kg)   SpO2 98%   BMI 41.98 kg/m   Physical Exam Vitals reviewed.  Constitutional:      Appearance: Normal appearance. She is obese.  Cardiovascular:     Rate and Rhythm: Normal rate and regular rhythm.     Heart sounds: Normal heart sounds.  Pulmonary:     Effort: Pulmonary effort is normal.     Breath sounds: Normal breath sounds.  Skin:    General: Skin is warm and dry.  Neurological:     Mental Status: She is alert and oriented to person, place, and time.  Psychiatric:        Mood and Affect: Mood normal.        Behavior: Behavior normal.        Thought Content: Thought content normal.        Judgment: Judgment normal.          Assessment & Plan:   Problem List Items Addressed This Visit   None Visit Diagnoses       Fatigue, unspecified type    -  Primary Labs today.   Relevant Orders   CBC with Differential/Platelet   Comprehensive metabolic panel with GFR   TSH   IBC + Ferritin   B12 and Folate Panel     Encounter for medical examination to establish care          Morbid obesity (HCC)     Labs today. Continue to encourage healthy lifestyle choices.    Relevant Orders   CBC with Differential/Platelet   Comprehensive metabolic panel with GFR   Hemoglobin A1c   TSH     Nexplanon  in place     She would like it removed. Unsure if she wants other birth control or not. Referral placed.   Relevant Orders   Ambulatory referral to Obstetrics / Gynecology     Prediabetes     Labs today.   Relevant Orders   Hemoglobin A1c   Lipid panel     Screening for lipid disorders       Relevant Orders   Lipid panel        Return in about 6 months (around 05/13/2024) for  physical.   [name], NP

## 2023-11-15 ENCOUNTER — Ambulatory Visit: Payer: Self-pay | Admitting: Family Medicine

## 2023-11-24 ENCOUNTER — Telehealth: Payer: Self-pay | Admitting: Neurology

## 2023-11-24 ENCOUNTER — Ambulatory Visit: Admitting: Family Medicine

## 2023-11-24 NOTE — Telephone Encounter (Signed)
 Called patient back with no answer. LVM letting her know to find out if her employer needs the lab or skin test and call back so we can schedule accordingly.

## 2023-11-24 NOTE — Telephone Encounter (Signed)
 Okay to order?  Copied from CRM [id number]. Topic: Clinical - Request for Lab/Test Order >> Nov 24, 2023  9:34 AM Christy Jackson wrote: Reason for CRM: Patient requesting a TB test for employment. Please call patient once order is in.

## 2023-11-24 NOTE — Telephone Encounter (Signed)
 Yes, that's fine.

## 2023-11-27 ENCOUNTER — Ambulatory Visit (HOSPITAL_BASED_OUTPATIENT_CLINIC_OR_DEPARTMENT_OTHER)
Admission: RE | Admit: 2023-11-27 | Discharge: 2023-11-27 | Disposition: A | Discharge status: Home / Self Care | Source: Ambulatory Visit | Attending: Family Medicine | Admitting: Family Medicine

## 2023-11-27 ENCOUNTER — Ambulatory Visit: Payer: Self-pay | Admitting: Family Medicine

## 2023-11-27 ENCOUNTER — Encounter: Payer: Self-pay | Admitting: Family Medicine

## 2023-11-27 ENCOUNTER — Ambulatory Visit: Admitting: Family Medicine

## 2023-11-27 VITALS — BP 108/76 | HR 95 | Ht 63.0 in | Wt 238.0 lb

## 2023-11-27 DIAGNOSIS — E119 Type 2 diabetes mellitus without complications: Secondary | ICD-10-CM

## 2023-11-27 DIAGNOSIS — Z7984 Long term (current) use of oral hypoglycemic drugs: Secondary | ICD-10-CM | POA: Diagnosis not present

## 2023-11-27 DIAGNOSIS — R7611 Nonspecific reaction to tuberculin skin test without active tuberculosis: Secondary | ICD-10-CM | POA: Diagnosis present

## 2023-11-27 MED ORDER — METFORMIN HCL 500 MG PO TABS: 500.0000 mg | ORAL_TABLET | Freq: Two times a day (BID) | ORAL | 3 refills | Status: AC

## 2023-11-27 NOTE — Assessment & Plan Note (Signed)
 Recent A1c of 6.6% indicates progression from prediabetes to type 2 diabetes. - Start metformin. - Advise low carb diet and regular exercise. - Monitor fasting blood glucose daily, aiming for <130 mg/dL. - Re-evaluate A1c in 3-6 months. - Consider checking with insurance about coverage for Ozempic or Mounjaro. - Educated on potential side effects of medications and importance of lifestyle changes

## 2023-11-27 NOTE — Assessment & Plan Note (Signed)
 Obesity management discussed in the context of diabetes treatment. Weight loss is a goal to help manage diabetes and overall health. Discussed potential impact of Nexplanon  on weight gain. - Consider removal of Nexplanon  if contributing to weight gain. Previous referral made to GYN - phone number provided to patient.  - Encourage lifestyle modifications including diet and exercise. - Discuss potential use of GLP-1 medications like Ozempic or Mounjaro for DM and weight management.

## 2023-11-27 NOTE — Progress Notes (Signed)
 Established Patient Office Visit  Subjective   Patient ID: Christy Jackson, female    DOB: [birth year]  Age: 38 y.o. MRN: [id number]  Chief Complaint  Patient presents with   Medical Management of Chronic Issues   Diabetes    HPI    Discussed the use of AI scribe software for clinical note transcription with the patient, who gave verbal consent to proceed.  History of Present Illness Christy Jackson is a 38 year old female who presents with a positive TB skin test and elevated A1c levels.  She had a positive TB skin test performed at an urgent care. The reaction was larger yesterday than today, and she is still within the 72-hour window for reading the test. Born in [place], she believes received a TB vaccination before coming to the [place] . No night sweats or hemoptysis.  She has a history of elevated A1c levels, with recent results showing an increase to 6.6. Previous A1c levels were in the prediabetes range, between 5.7 and 5.9. She has a glucometer from her pregnancy and believes she has the necessary supplies to monitor her blood sugar. We discussed medication options. She is going to consider GLP-1, but is willing to start with metformin.           ROS All review of systems negative except what is listed in the HPI    Objective:     BP 108/76   Pulse 95   Ht 5' 3 (1.6 m)   Wt 238 lb (108 kg)   BMI 42.16 kg/m    Physical Exam Vitals reviewed.  Constitutional:      Appearance: Normal appearance. She is obese.  Neurological:     Mental Status: She is alert and oriented to person, place, and time.  Psychiatric:        Mood and Affect: Mood normal.        Behavior: Behavior normal.        Thought Content: Thought content normal.        Judgment: Judgment normal.      No results found for any visits on 11/27/23.    The ASCVD Risk score (Arnett DK, et al., 2019) failed to calculate for the following reasons:   The 2019 ASCVD risk score is only  valid for ages 42 to 73    Assessment & Plan:   Problem List Items Addressed This Visit       Active Problems   Type 2 diabetes mellitus without complication, without long-term current use of insulin (HCC)   Recent A1c of 6.6% indicates progression from prediabetes to type 2 diabetes. - Start metformin. - Advise low carb diet and regular exercise. - Monitor fasting blood glucose daily, aiming for <130 mg/dL. - Re-evaluate A1c in 3-6 months. - Consider checking with insurance about coverage for Ozempic or Mounjaro. - Educated on potential side effects of medications and importance of lifestyle changes      Relevant Medications   metFORMIN (GLUCOPHAGE) 500 MG tablet   Morbid obesity (HCC)   Obesity management discussed in the context of diabetes treatment. Weight loss is a goal to help manage diabetes and overall health. Discussed potential impact of Nexplanon  on weight gain. - Consider removal of Nexplanon  if contributing to weight gain. Previous referral made to GYN - phone number provided to patient.  - Encourage lifestyle modifications including diet and exercise. - Discuss potential use of GLP-1 medications like Ozempic or Mounjaro for DM and weight management.  Relevant Medications   metFORMIN (GLUCOPHAGE) 500 MG tablet   Other Visit Diagnoses       Positive TB test    -  Primary Positive PPD likely due to prior BCG vaccination. No symptoms suggestive of active TB.  - Order chest x-ray to rule out active TB.   Relevant Orders   DG Chest 2 View (Completed)         Return in about 3 months (around 02/27/2024) for chronic disease management.    [name], NP

## 2023-12-05 ENCOUNTER — Encounter: Payer: Self-pay | Admitting: Family Medicine

## 2023-12-05 DIAGNOSIS — E119 Type 2 diabetes mellitus without complications: Secondary | ICD-10-CM

## 2023-12-05 NOTE — Telephone Encounter (Signed)
 From last visit note below. Sounds like she is interested in starting Mounjaro.    Type 2 diabetes mellitus without complication, without long-term current use of insulin (HCC)     Recent A1c of 6.6% indicates progression from prediabetes to type 2 diabetes. - Start metformin. - Advise low carb diet and regular exercise. - Monitor fasting blood glucose daily, aiming for <130 mg/dL. - Re-evaluate A1c in 3-6 months. - Consider checking with insurance about coverage for Ozempic or Mounjaro. - Educated on potential side effects of medications and importance of lifestyle changes

## 2023-12-07 MED ORDER — TIRZEPATIDE 2.5 MG/0.5ML ~~LOC~~ SOAJ: 2.5000 mg | SUBCUTANEOUS | 1 refills | Status: DC

## 2023-12-18 ENCOUNTER — Telehealth: Payer: Self-pay

## 2023-12-18 ENCOUNTER — Other Ambulatory Visit (HOSPITAL_COMMUNITY): Payer: Self-pay

## 2023-12-18 NOTE — Telephone Encounter (Signed)
 Pharmacy Patient Advocate Encounter  Received notification from [employer] that Prior Authorization for Mounjaro 2.5mg /0.47ml has been APPROVED from 12/18/23 to 12/17/24. Ran test claim, Copay is $15. This test claim was processed through [facility]- copay amounts may vary at other pharmacies due to pharmacy/plan contracts, or as the patient moves through the different stages of their insurance plan.   PA #/Case ID/Reference #: [id number]

## 2023-12-18 NOTE — Telephone Encounter (Signed)
 Pharmacy Patient Advocate Encounter   Received notification from Patient Advice Request messages that prior authorization for Mounjaro 2.5mg /0.1ml is required/requested.   Insurance verification completed.   The patient is insured through [employer].   Per test claim: PA required; PA submitted to above mentioned insurance via Latent Key/confirmation #/EOC [id number] Status is pending

## 2024-02-26 NOTE — Progress Notes (Signed)
 "  Established Patient Office Visit  Subjective:  Patient ID: Christy Jackson, female    DOB: [birth year]  Age: 39 y.o. MRN: [id number]  CC: No chief complaint on file.     HPI Christy Jackson is here for ***.   Past Medical History:  Diagnosis Date   Gestational diabetes    Medical history non-contributory     Past Surgical History:  Procedure Laterality Date   CESAREAN SECTION MULTI-GESTATIONAL N/A 08/10/2020   Procedure: CESAREAN SECTION MULTI-GESTATIONAL;  Surgeon: Izell Harari, MD;  Location: [facility];  Service: Obstetrics;  Laterality: N/A;  Twin gestation   IVF   12/16/2019   LAPAROSCOPY N/A 01/16/2017   Procedure: LAPAROSCOPY OPERATIVE, RIGHT SALPINGECTOMY;  Surgeon: Herchel Gloris LABOR, MD;  Location: [facility];  Service: Gynecology;  Laterality: N/A;    Family History  Problem Relation Age of Onset   Diabetes Mother    Hypertension Mother     Social History   Socioeconomic History   Marital status: Married    Spouse name: Not on file   Number of children: Not on file   Years of education: Not on file   Highest education level: Not on file  Occupational History   Not on file  Tobacco Use   Smoking status: Never   Smokeless tobacco: Never  Vaping Use   Vaping status: Never Used  Substance and Sexual Activity   Alcohol use: No   Drug use: No   Sexual activity: Not Currently    Birth control/protection: None  Other Topics Concern   Not on file  Social History Narrative   Not on file   Social Drivers of Health   Tobacco Use: Low Risk (11/27/2023)   Patient History    Smoking Tobacco Use: Never    Smokeless Tobacco Use: Never    Passive Exposure: Not on file  Financial Resource Strain: Not on file  Food Insecurity: No Food Insecurity (02/22/2021)   Hunger Vital Sign    Worried About Running Out of Food in the Last Year: Never true    Ran Out of Food in the Last Year: Never true  Transportation Needs: No Transportation Needs (02/22/2021)   PRAPARE -  Administrator, Civil Service (Medical): No    Lack of Transportation (Non-Medical): No  Physical Activity: Not on file  Stress: Not on file  Social Connections: Unknown (06/23/2021)   Received from [facility]   Social Network    Social Network: Not on file  Intimate Partner Violence: Unknown (05/21/2021)   Received from [facility]   HITS    Physically Hurt: Not on file    Insult or Talk Down To: Not on file    Threaten Physical Harm: Not on file    Scream or Curse: Not on file  Depression (PHQ2-9): Medium Risk (11/14/2023)   Depression (PHQ2-9)    PHQ-2 Score: 7  Alcohol Screen: Not on file  Housing: Not on file  Utilities: Not on file  Health Literacy: Not on file    ROS All ROS negative except what is listed in the HPI.   Objective:   Today's Vitals: There were no vitals taken for this visit.  Physical Exam  Assessment & Plan:   Problem List Items Addressed This Visit   None     Follow-up: No follow-ups on file.   Christy FURY Almarie, DNP, FNP-C  I,Emily Lagle,acting as a neurosurgeon for Christy KATHEE Almarie, NP.,have documented all relevant documentation on the behalf  of Christy KATHEE Mon, NP.  I, Christy KATHEE Mon, NP, have reviewed all documentation for this visit. The documentation on 02/28/2024 for the exam, diagnosis, procedures, and orders are all accurate and complete. "

## 2024-02-28 ENCOUNTER — Encounter: Payer: Self-pay | Admitting: Family Medicine

## 2024-02-28 ENCOUNTER — Ambulatory Visit: Admitting: Family Medicine

## 2024-02-28 VITALS — BP 123/77 | HR 80 | Ht 63.0 in | Wt 225.0 lb

## 2024-02-28 DIAGNOSIS — Z7984 Long term (current) use of oral hypoglycemic drugs: Secondary | ICD-10-CM | POA: Diagnosis not present

## 2024-02-28 DIAGNOSIS — Z7985 Long-term (current) use of injectable non-insulin antidiabetic drugs: Secondary | ICD-10-CM

## 2024-02-28 DIAGNOSIS — E119 Type 2 diabetes mellitus without complications: Secondary | ICD-10-CM

## 2024-02-28 LAB — BASIC METABOLIC PANEL WITH GFR
BUN: 6 mg/dL (ref 6–23)
CO2: 25 meq/L (ref 19–32)
Calcium: 8.7 mg/dL (ref 8.4–10.5)
Chloride: 103 meq/L (ref 96–112)
Creatinine, Ser: 0.47 mg/dL (ref 0.40–1.20)
GFR: 120.42 mL/min
Glucose, Bld: 100 mg/dL — ABNORMAL HIGH (ref 70–99)
Potassium: 4.1 meq/L (ref 3.5–5.1)
Sodium: 136 meq/L (ref 135–145)

## 2024-02-28 LAB — MICROALBUMIN / CREATININE URINE RATIO
Creatinine,U: 125.9 mg/dL
Microalb Creat Ratio: UNDETERMINED mg/g (ref 0.0–30.0)
Microalb, Ur: <0.7 mg/dL

## 2024-02-28 LAB — HEMOGLOBIN A1C: Hgb A1c MFr Bld: 6 % (ref 4.6–6.5)

## 2024-02-28 MED ORDER — TIRZEPATIDE 5 MG/0.5ML ~~LOC~~ SOAJ: 5.0000 mg | SUBCUTANEOUS | 1 refills | Status: DC

## 2024-02-28 NOTE — Assessment & Plan Note (Signed)
 Las A1c of 6.6% indicates progression from prediabetes to type 2 diabetes. - Increase Mounjaro . Okay to hold off on metformin  for now.  - Advise low carb diet and regular exercise. - Monitor fasting blood glucose daily, aiming for <130 mg/dL. - Re-evaluate A1c today - Educated on potential side effects of medications and importance of lifestyle changes

## 2024-02-29 ENCOUNTER — Ambulatory Visit: Payer: Self-pay | Admitting: Family Medicine

## 2024-03-05 ENCOUNTER — Encounter: Payer: Self-pay | Admitting: Family Medicine

## 2024-03-05 DIAGNOSIS — E119 Type 2 diabetes mellitus without complications: Secondary | ICD-10-CM

## 2024-03-06 ENCOUNTER — Telehealth: Payer: Self-pay

## 2024-03-06 ENCOUNTER — Other Ambulatory Visit (HOSPITAL_COMMUNITY): Payer: Self-pay

## 2024-03-06 NOTE — Telephone Encounter (Signed)
 Pharmacy Patient Advocate Encounter   Received notification from Physician's Office that prior authorization for Mounjaro  is required/requested.   Insurance verification completed.   The patient is insured through [employer].   Per test claim: Per test claim, medication is not covered due to plan/benefit exclusion, PA not submitted at this time

## 2024-03-08 MED ORDER — OZEMPIC (0.25 OR 0.5 MG/DOSE) 2 MG/3ML ~~LOC~~ SOPN: 0.2500 mg | PEN_INJECTOR | SUBCUTANEOUS | 1 refills | Status: AC

## 2024-03-08 NOTE — Addendum Note (Signed)
 Addended by: Suresh Audi B on: 03/08/2024 01:32 PM   Modules accepted: Orders

## 2024-03-19 NOTE — Telephone Encounter (Signed)
 Has PA request for Ozempic  been attempted?

## 2024-03-21 ENCOUNTER — Other Ambulatory Visit (HOSPITAL_COMMUNITY): Payer: Self-pay

## 2024-03-21 ENCOUNTER — Telehealth: Payer: Self-pay

## 2024-03-21 NOTE — Telephone Encounter (Signed)
 Pharmacy Patient Advocate Encounter   Received notification from Physician's Office that prior authorization for Ozempic  is required/requested.   Insurance verification completed.   The patient is insured through [facility] MEDICAID.   Per test claim: PA required; PA submitted to above mentioned insurance via Latent Key/confirmation #/EOC [id number] Status is pending

## 2024-03-21 NOTE — Telephone Encounter (Signed)
 Pharmacy Patient Advocate Encounter  Received notification from [facility] MEDICA[employer] that Prior Authorization for Ozempic  has been APPROVED from 03/21/2024 to 03/21/2025. Ran test claim, Copay is $15. This test claim was processed through [facility]- copay amounts may vary at other pharmacies due to pharmacy/plan contracts, or as the patient moves through the different stages of their insurance plan.   PA #/Case ID/Reference #: [id number]

## 2024-11-11 ENCOUNTER — Ambulatory Visit: Admitting: Family Medicine
